# Patient Record
Sex: Male | Born: 1965 | Race: Black or African American | Hispanic: No | Smoking: Never smoker
Health system: Southern US, Community
[De-identification: ages and names within clinical notes are randomized; demographics above are authoritative.]

## PROBLEM LIST (undated history)

## (undated) DIAGNOSIS — R7303 Prediabetes: Secondary | ICD-10-CM

## (undated) DIAGNOSIS — H9191 Unspecified hearing loss, right ear: Secondary | ICD-10-CM

## (undated) DIAGNOSIS — K219 Gastro-esophageal reflux disease without esophagitis: Secondary | ICD-10-CM

## (undated) DIAGNOSIS — E785 Hyperlipidemia, unspecified: Secondary | ICD-10-CM

## (undated) DIAGNOSIS — H9319 Tinnitus, unspecified ear: Secondary | ICD-10-CM

## (undated) DIAGNOSIS — M48061 Spinal stenosis, lumbar region without neurogenic claudication: Secondary | ICD-10-CM

## (undated) DIAGNOSIS — G4730 Sleep apnea, unspecified: Secondary | ICD-10-CM

## (undated) DIAGNOSIS — Q159 Congenital malformation of eye, unspecified: Secondary | ICD-10-CM

## (undated) DIAGNOSIS — F4310 Post-traumatic stress disorder, unspecified: Secondary | ICD-10-CM

## (undated) DIAGNOSIS — R42 Dizziness and giddiness: Secondary | ICD-10-CM

## (undated) DIAGNOSIS — E291 Testicular hypofunction: Secondary | ICD-10-CM

## (undated) DIAGNOSIS — I10 Essential (primary) hypertension: Secondary | ICD-10-CM

## (undated) HISTORY — DX: Dizziness and giddiness: R42

## (undated) HISTORY — DX: Testicular hypofunction: E29.1

## (undated) HISTORY — PX: BUNIONECTOMY: SHX129

## (undated) HISTORY — DX: Spinal stenosis, lumbar region without neurogenic claudication: M48.061

## (undated) HISTORY — PX: EYE SURGERY: SHX253

## (undated) HISTORY — DX: Essential (primary) hypertension: I10

## (undated) HISTORY — DX: Hyperlipidemia, unspecified: E78.5

## (undated) HISTORY — PX: CATARACT EXTRACTION: SUR2

## (undated) HISTORY — DX: Prediabetes: R73.03

---

## 1997-12-25 ENCOUNTER — Ambulatory Visit (HOSPITAL_COMMUNITY): Admission: RE | Admit: 1997-12-25 | Discharge: 1997-12-25 | Payer: Self-pay

## 1999-10-21 ENCOUNTER — Emergency Department (HOSPITAL_COMMUNITY): Admission: EM | Admit: 1999-10-21 | Discharge: 1999-10-21 | Payer: Self-pay | Admitting: Emergency Medicine

## 1999-10-22 ENCOUNTER — Encounter: Payer: Self-pay | Admitting: Ophthalmology

## 1999-10-22 ENCOUNTER — Ambulatory Visit (HOSPITAL_COMMUNITY): Admission: RE | Admit: 1999-10-22 | Discharge: 1999-10-22 | Payer: Self-pay | Admitting: Ophthalmology

## 2000-01-17 ENCOUNTER — Emergency Department (HOSPITAL_COMMUNITY): Admission: EM | Admit: 2000-01-17 | Discharge: 2000-01-17 | Payer: Self-pay | Admitting: Emergency Medicine

## 2002-09-06 ENCOUNTER — Encounter: Admission: RE | Admit: 2002-09-06 | Discharge: 2002-09-19 | Payer: Self-pay | Admitting: Sports Medicine

## 2005-05-24 HISTORY — PX: TOTAL HIP ARTHROPLASTY: SHX124

## 2006-05-24 HISTORY — PX: PARTIAL NEPHRECTOMY: SHX414

## 2006-10-15 ENCOUNTER — Ambulatory Visit (HOSPITAL_COMMUNITY): Admission: RE | Admit: 2006-10-15 | Discharge: 2006-10-15 | Payer: Self-pay | Admitting: Urology

## 2006-10-24 ENCOUNTER — Encounter (INDEPENDENT_AMBULATORY_CARE_PROVIDER_SITE_OTHER): Payer: Self-pay | Admitting: Urology

## 2006-10-24 ENCOUNTER — Inpatient Hospital Stay (HOSPITAL_COMMUNITY): Admission: RE | Admit: 2006-10-24 | Discharge: 2006-10-27 | Payer: Self-pay | Admitting: Urology

## 2007-07-21 ENCOUNTER — Inpatient Hospital Stay (HOSPITAL_COMMUNITY): Admission: RE | Admit: 2007-07-21 | Discharge: 2007-07-25 | Payer: Self-pay | Admitting: Orthopedic Surgery

## 2010-06-14 ENCOUNTER — Encounter: Payer: Self-pay | Admitting: Urology

## 2010-10-06 NOTE — Op Note (Signed)
NAMEAREND, BAHL NO.:  [id number]   MEDICAL RECORD NO.:  [id number]          PATIENT TYPE:  INP   LOCATION:  NA                           FACILITY:  [facility]   PHYSICIAN:  Ollen Gross, M.D.    DATE OF BIRTH:  1966/04/02   DATE OF PROCEDURE:  07/21/2007  DATE OF DISCHARGE:                               OPERATIVE REPORT   PREOPERATIVE DIAGNOSES:  Avascular necrosis, right hip.   POSTOPERATIVE DIAGNOSES:  Avascular necrosis, right hip.   PROCEDURE:  Right total hip arthroplasty.   SURGEON:  Dr. Homero Fellers Aluisio.   ASSISTANT:  Avel Peace, PA-C.   ANESTHESIA:  General.   ESTIMATED BLOOD LOSS:  250.   DRAINS:  Hemovac x1.   COMPLICATIONS:  None.   CONDITION:  Stable to recovery.   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  He has gone on to femoral head collapse and presents now  for total hip arthroplasty.   PROCEDURE IN DETAIL:  After successful initiation of general anesthetic,  the patient is placed in the left lateral decubitus position with the  right side up and held with the hip positioner.  The right lower  extremity is isolated from his perineum with plastic drapes and prepped  and draped in the usual sterile fashion.  A short posterolateral  incision was made with a 10 blade through the subcutaneous tissue to the  level of fascia lata which was incised in line with the skin incision.  The sciatic nerve is palpated and protected and the short rotators  isolated off the femur.  Capsulectomy is performed and the hip is  dislocated.  It is noted that there is an area of collapse of the  femoral head.  The center of the head is marked and a trial prosthesis  placed such that the center of the trial head corresponds to the center  of his native femoral head.  The osteotomy line is marked on the femoral  neck and osteotomy made with an  oscillating saw.  The femoral head is  removed and then the  femur retracted anteriorly to gain acetabular  exposure.  On the back table,  I did bisect the femoral head with an  oscillating saw just to gauge the amount of osteonecrosis present.  He  did have about a 2 x 3 cm area of completely dead bone with a reactive  edema around that.   Acetabular retractors are placed and labrum and is removed.  Acetabular  reaming starts at 45 mm coursing in increments of 2-55 mm and then a 56  mm pinnacle acetabular shell was placed in anatomic position and  transfixed with two dome screws.  The apex hole eliminator is placed and  then the 40 mm neutral Ultamet metal liner is placed.  It is a metal-on-  metal hip replacement.   The tibia is prepared with the canal finder and irrigation.  Axial  reaming is performed up to 13.5 mm of proximal reaming to an 62F  and the  sleeve machined to extra extra large. An 49F extra extra large trial  sleeve is placed with an 18 x 13 stem and a 36 plus 12 neck matching  native anteversion.  A 40 plus.0 head is placed and the hip is reduced.  There is great stability, but soft tissue wise we needed a little more  tension.  We went to a 40 plus 3 which had the appropriate soft tissue  tension.  The right leg was placed on top of the left and the leg  lengths were equal.  Stability was excellent in full extension, flexion  and rotation. 70 degrees flexion, 40 degrees adduction, 90 degrees  internal rotation, 90 degrees of flexion, and 70 degrees of internal  rotation.  The hip is dislocated and the femoral trial is removed.  The  permanent 49F extra extra large sleeve is placed with an 18 x 13 stem  and a 36 plus 8 neck matching native anteversion.  The 40 plus 3 head is  placed and the hips reduced with the same stability parameters.  The  wound was copiously irrigated with saline solution and the short  rotators reattached to the femur through drill holes.  The  fascia lata  is closed over a Hemovac drain with interrupted #1 Vicryl, subcu closed  with #1 and 2-0 Vicryl and subcuticular with running 4-0 Monocryl.  The  drain is hooked to suction, incision cleaned and dried.  Steri-Strips  and a bulky sterile dressing applied.  His right lower extremity is then  placed into a knee immobilizer and he is awakened and transported to  recovery in stable condition.      Ollen Gross, M.D.  Electronically Signed     FA/MEDQ  D:  07/21/2007  T:  07/22/2007  Job:  [id number]

## 2010-10-06 NOTE — Discharge Summary (Signed)
NAMEHAI, GRABE NO.:  [id number]   MEDICAL RECORD NO.:  [id number]          PATIENT TYPE:  INP   LOCATION:  1606                         FACILITY:  [facility]   PHYSICIAN:  Ollen Gross, M.D.    DATE OF BIRTH:  [birth year]   DATE OF ADMISSION:  07/21/2007  DATE OF DISCHARGE:  07/25/2007                               DISCHARGE SUMMARY   ADMISSION DIAGNOSES:  1. Osteonecrosis/avascular necrosis, right hip.  2. History of renal cancer, status post partial nephrectomy.   DISCHARGE DIAGNOSES:  1. Avascular necrosis, right hip, status post right total replacement      arthroplasty.  2. Mild postop hyponatremia, improved.  3. History of renal cancer, status post partial nephrectomy.   PROCEDURE:  On July 21, 2007, right total hip surgery by Dr.  Lequita Halt.   Assistant Alexzandrew L. Perkins, PA-C.  Surgery performed under general  anesthesia.   CONSULTS:  None.   BRIEF HISTORY:  Craig [name] is a 45 year old male with high grade  osteonecrosis of the right hip with progressively worsening pain and  dysfunction.  He has gone on to have some thermal height collapse and  now presents for total hip arthroplasty.   LABORATORY DATA:  CBC on admission showed hemoglobin of 14.9, hematocrit  of 44.7, white cell count 6.7, platelets 270.  Chem panel on admission  all within normal limits.  Preop UA trace hemoglobin, otherwise  negative.  PT and INR preop 13.1 and 1.0 with PTT of 32.  Serial CBCs  were followed.  Last H and H was 12.6 and 36.9.  Serum pro times  followed, PT 126.2 and 2.3.  Serum BMPs were followed.  Sodium did drop  down to 134, postop back up to 136.   X-rays, hip films July 13, 2007, findings compatible, provide  history of AVM with patchy sclerosis and mild femoral head flattening.  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  Chest x-ray  dated Oct 21, 2006, no  acute cardiac or pulmonary process.  No evidence  of metastatic disease.   HOSPITAL COURSE:  The patient was admitted to [facility].  Tolerated the procedure well and was transferred to the recovery room  and then to the floor.  Started on PCA and pain medications obtained.  Following surgery started on Coumadin for DVT prophylaxis.  Actually  doing very well on the morning of day 1.  Already can tell pain he was  having preop was improved.  Started to get up out of bed by day 2.  He  was doing a little bit better, ambulating about 60 feet.  Just a little  bit of mild itching, felt to be due from the PCA that was discontinued.  IVs were Hep-Locked.  Started progressing well with his PT and by day 3,  he was up ambulating much better.  Weaned over to p.o. meds.  Continue  with therapy and try to go home on July 25, 2007.   DISCHARGE PLAN:  1. The patient  is discharged home on July 25, 2007.  2. Discharge diagnosis, please see above.  3. Discharge medications:  Coumadin, Percocet, Robaxin.  4. Followup in 2 weeks.  5. Activity, partial weightbearing 25-50% right lower extremity.  6. Diet as tolerated.   DISPOSITION:  Home.   CONDITION ON DISCHARGE:  Improved.      Alexzandrew L. Perkins, P.A.C.      Ollen Gross, M.D.  Electronically Signed    ALP/MEDQ  D:  07/25/2007  T:  07/25/2007  Job:  [id number]   cc:   Ollen Gross, M.D.  Fax: [phone]   [facility]

## 2010-10-06 NOTE — H&P (Signed)
Craig [name], BRENNEN NO.:  [id number]   MEDICAL RECORD NO.:  [id number]          PATIENT TYPE:  INP   LOCATION:  1606                         FACILITY:  [facility]   PHYSICIAN:  Ollen Gross, M.D.    DATE OF BIRTH:  1965-10-26   DATE OF ADMISSION:  07/21/2007  DATE OF DISCHARGE:                              HISTORY & PHYSICAL   DATE OF OFFICE VISIT/HISTORY AND PHYSICAL:  July 07, 2007.   DATE OF ADMISSION:  July 21, 2007.   CHIEF COMPLAINT:  Right hip pain.   HISTORY OF PRESENT ILLNESS:  The patient is a 45 year old male who has  seen by Dr. Lequita Halt in second opinion for ongoing right hip pain.  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  It  shortly began after removal of a cyst from his kidney.  He had a partial  nephrectomy done for a renal cancer.  That turned out well but shortly  after he started having intense pain which was progressive.  He had  workup by Dr. Prince Rome and initial films were negative but significant for  an MRI which showed stage II  osteonecrosis of the hip.  He was referred  to [facility] for vascularized fibular grafting, but due to the extended long  recovery period of time he wanted to look into other procedures and  options.  He was seen in second opinion by Dr. Homero Fellers Aluisio.  Risks and  benefits and surgical options discussed, and he has elected to proceed  with total hip replacement.   ALLERGIES:  NAPROSYN CAUSES A RASH.   INTOLERANCES:  HYDROCODONE CAUSES HIM TO FREAK OUT.   PAST MEDICAL HISTORY:  Renal cancer.   PAST SURGICAL HISTORY:  Partial left nephrectomy for renal cancer,  bunion x2, and also shoulder surgery.   FAMILY HISTORY:  Negative.   SOCIAL HISTORY:  Married.  Works for the [employer].  Nonsmoker.  No alcohol.  Two children.   REVIEW OF SYSTEMS:  GENERAL:  No fevers, chills, night sweats.  NEUROLOGIC:   No seizure, syncope, paralysis.  RESPIRATORY:  No shortness  breath, productive cough or hemoptysis.  CARDIOVASCULAR:  No chest pain,  angina, orthopnea.  GASTROINTESTINAL:  No nausea, vomiting, diarrhea,  constipation.  GENITOURINARY:  No dysuria,  hematuria or discharge.  MUSCULOSKELETAL:  Right hip.   PHYSICAL EXAMINATION:  VITAL SIGNS:  Pulse 68, respirations 12, blood  pressure 112/78.  GENERAL:  A 45 year old [place] American male, well-nourished, well-  developed, large muscular frame.  Alert, oriented and cooperative,  accompanied by his wife.  HEENT:  Normocephalic, atraumatic.  Pupils are round and reactive.  EOMs  intact.  NECK:  Supple.  CHEST:  Clear.  HEART:  Regular rate and rhythm without murmur.  ABDOMEN:  Soft, nontender.  Bowel sounds present.  BREAST/GENITALIA:  Not done, not pertinent to present illness.  EXTREMITIES:  Right hip flexion 100, internal rotation 10, external  rotation 30, abduction 30.   IMPRESSION:  Osteonecrosis/avascular necrosis, right hip.  PLAN:  The patient admitted to [facility] to undergo a right  total hip replacement arthroplasty.  Surgery will be performed by Dr.  Ollen Gross.      Alexzandrew L. Perkins, P.A.C.      Ollen Gross, M.D.  Electronically Signed    ALP/MEDQ  D:  07/20/2007  T:  07/21/2007  Job:  [id number]   cc:   Ollen Gross, M.D.  Fax: [phone]   [facility]

## 2010-10-06 NOTE — H&P (Signed)
Craig [name], Craig [name] NO.:  [id number]   MEDICAL RECORD NO.:  [id number]          PATIENT TYPE:  INP   LOCATION:  1402                         FACILITY:  [facility]   PHYSICIAN:  Sigmund I. 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   The patient had complained of back pain and underwent MRI at [facility] on 09/28/2006 with a finding of a degenerative disk disease,  with an incidental finding of a 2.5 cm left lower pole renal mass.  The  patient had ultrasound that made the mass appear solid.  CT scan with  and without contrast was accomplished, again showing that the patient  appeared to have a solid mass with 39 Hounsfield units by measurement.  Repeat MRI of the kidney showed the lesion well, but was unable to  determine if it was a renal cell carcinoma or not.  The patient is now  for partial nephrectomy.   PAST UROLOGIC HISTORY:  Noncontributory.   PAST MEDICAL HISTORY:  Noncontributory except for chronic low back pain.   PAST SURGERIES:  Bilateral leg surgery.   PAST MEDICATIONS:  P.r.n. Darvocet only.   ALLERGIES:  None known.   FAMILY HISTORY:  Noncontributory.  Both parents alive and well.   Tobacco none.  Alcohol none.   Constitutional review of systems is otherwise noncontributory.   PHYSICAL EXAMINATION:  GENERAL:  Physical exam shows a well-developed,  well-nourished African American male in no acute distress.  VITAL SIGNS:  His blood pressure was 134/94, pulse 82, temperature 97.6.  NECK: Supple, nontender.  CHEST:  Clear to P&A.  ABDOMEN:  Soft.  Positive bowel sounds.  No organomegaly or masses.  The  patient is a well muscled individual.  GENITOURINARY:  Shows normal penis, normal urethra, normal  glans.  Testicles measure 4 x 4 cm and nontender.  RECTAL: Examination not indicated this examination.  EXTREMITIES:  No cyanosis or edema.  PSYCHOLOGIC:  Normal orientation to time, person, place.  SKIN:  Normal to inspection, palpation.   IMPRESSION:  Indeterminate left renal mass for exploration and  extirpation.      Sigmund I. Patsi Sears, M.D.  Electronically Signed     SIT/MEDQ  D:  10/24/2006  T:  10/25/2006  Job:  [id number]

## 2010-10-06 NOTE — Op Note (Signed)
Craig [name], Craig [name] NO.:  [id number]   MEDICAL RECORD NO.:  [id number]          PATIENT TYPE:  INP   LOCATION:  1402                         FACILITY:  [facility]   PHYSICIAN:  Sigmund I. Patsi Sears, M.D.DATE OF BIRTH:  1966-04-13   DATE OF PROCEDURE:  10/24/2006  DATE OF DISCHARGE:                               OPERATIVE REPORT   PREOPERATIVE DIAGNOSIS:  Left lower pole indeterminate cystic versus  solid renal mass.   POSTOPERATIVE DIAGNOSIS:  Left lower pole solid renal mass.   OPERATION:  Left partial nephrectomy.   SURGEON:  Sigmund I. Patsi Sears, M.D.   ASSISTANT:  Ezzie Dural, M.D.   PREPARATION:  After appropriate preanesthesia, the patient was brought  to operating room and placed on the operating table in the dorsal supine  position, where general endotracheal anesthesia was induced.  He was  placed in the right lateral decubitus position, where the left flank was  prepped with Betadine and draped in usual fashion.   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  Ultrasound  and CT classified the lesion as a Bosniak 59F lesion, with a CT showing  possible solid tumor, with approximate 25 HU.  Because diagnosis could  not be made, the patient is for exploration and partial left  nephrectomy.   PROCEDURE:  A 15-cm left flank incision was made over the 12th rib and  subcutaneous tissue dissected with the electrosurgical unit.  The tip  the 12th rib was identified, and the 12th rib dissected and amputated  proximal-ward.  Care was taken to avoid any injury to the pleura, and  dissection was then accomplished of the external oblique, internal  oblique and transverse abdominis muscle, with care taken to avoid any  injury into the peritoneal cavity.  The rectus muscle was not  identified.   A  self-retaining retractor was placed, and the retroperitoneum was  entered.  The lower pole of the kidney was identified and Gerota's  fascia entered, and the perirenal fat was dissected.  Cyst was  identified in the lateral aspect of the lower pole, and this coincided  with a cyst identified on MRI.  Beneath this area was a 2-cm area which  appeared to be the mass in question.  The mass, however, was endophytic,  and was poorly palpable or identifiable from the outside of the kidney.   The area around the tumor was scarified with the electrosurgical unit,  and the kidney mass dissected.  The mass was removed, measuring  approximately 2.5 cm.   The larger bleeders were electrocoagulated with both the cautery and the  argon beam coagulator.  FloSeal was placed in the wound, and all  bleeding was stopped.  Tisseel was also placed, and the area of  dissection covered with fat and sutured with 2-0 PDS suture.   Frozen section revealed that the mass was positive for solid tumor,  although it could not be decided what  the exact nature of the tumor was;  it did not appear to be a typical renal cell carcinoma, however.  It was  felt that the patient may have a benign tumor.  The margins were taken  as deep as possible, to the level of the infundibulum of the lower pole;  it felt that deeper margin could not be obtained without going into the  renal pelvis; therefore, the wound was closed.   The transverse abdominis muscle was closed with running #1 PDS suture.  The internal oblique and external oblique were closed separately with  running #1 PDS sutures.  The rib bed was closed with #1 PDS suture.  Marcaine 0.5, thirty milliliters, was injected into the rib bed and the  wound edges.   Marcaine pump was inserted beneath the external oblique fascia, and  superficial to the external oblique fascia in the subcutaneous tissue.  This was held in place with tape.   The skin was then closed with  skin staples.  A sterile dressing was  applied, and the patient was awakened and taken to the recovery room in  good condition.  It is noted that the patient had a Foley catheter  placed at the beginning of the case.      Sigmund I. Patsi Sears, M.D.  Electronically Signed     SIT/MEDQ  D:  10/24/2006  T:  10/25/2006  Job:  [id number]

## 2010-10-06 NOTE — Discharge Summary (Signed)
NAMEMAALIK, PINN NO.:  [id number]   MEDICAL RECORD NO.:  [id number]           PATIENT TYPE:   LOCATION:                                 FACILITY:   PHYSICIAN:  Sigmund I. Patsi Sears, M.D.DATE OF BIRTH:  01/05/1966   DATE OF ADMISSION:  DATE OF DISCHARGE:                               DISCHARGE SUMMARY   __________   FINAL DIAGNOSES:  Renal mass, indeterminate, pathology pending.   OPERATIONS THIS ADMISSION:  June 2, partial left lower pole nephrectomy.   ADDITIONAL DIAGNOSES THIS ADMISSION:  Ocular global protrusion and  ocular global prolapse (resolved).   DISCHARGE MEDICATIONS:  Percocet 5/325, 1 q.4 h. p.r.n. pain.   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  The renal mass was the initial finding.  The ultrasound appeared to be  solid and CT with contrast was accomplished and showed Houndsfield units  of 39.  MRI of the lesion continued to be indeterminate for renal cell  carcinoma and the patient was admitted to the operating room for partial  nephrectomy, with extirpation tumor.   PAST UROLOGIC HISTORY:  Noncontributory.   PAST MEDICAL HISTORY:  Chronic low back pain, remitting.   REVIEW OF SYSTEMS:  Noncontributory.   MEDICATIONS:  None.   ALLERGIES:  None.   FAMILY HISTORY:  Noncontributory.   TOBACCO:  None.   ALCOHOL:  None.   ADMISSION PHYSICAL EXAMINATION:  Unchanged since the exam on October 24, 2006, on admission.   ADMISSION LABORATORY DATA:  1. Sodium 134, potassium 3.5, chloride 98, CO2 28, BUN 15, creatinine      1.5, calcium 9.1.  Hemoglobin 14.8, hematocrit 44.7.  2. A chest x-ray shows normal lungs.   HOSPITAL COURSE:  On the day of admission, the patient underwent left  lower pole nephrectomy.  Pathologic examination is pending review with  the  [place] [facility] [place].  The patient has done well with minimal  postoperative pain secondary to pain pump.  The pain pump was removed.  The patient was able to eat and had a bowel movement.  His Foley was  removed on the 1st postoperative day and the patient voided well.  He  was allowed to be discharged in good condition.  He will be notified of  his pathology results.  He will return in one week for office suture  removal.      Sigmund I. Patsi Sears, M.D.  Electronically Signed     SIT/MEDQ  D:  10/27/2006  T:  10/27/2006  Job:  [id number]

## 2011-02-12 LAB — TYPE AND SCREEN: ABO/RH(D): B POS

## 2011-02-12 LAB — COMPREHENSIVE METABOLIC PANEL
ALT: 39
AST: 33
Alkaline Phosphatase: 53
CO2: 29
Chloride: 106
GFR calc Af Amer: >60
GFR calc non Af Amer: >60
Sodium: 142
Total Bilirubin: 0.8

## 2011-02-12 LAB — PROTIME-INR: Prothrombin Time: 14.1

## 2011-02-12 LAB — URINE MICROSCOPIC-ADD ON

## 2011-02-12 LAB — URINALYSIS, ROUTINE W REFLEX MICROSCOPIC
Glucose, UA: 100 — AB
Leukocytes, UA: NEGATIVE
Protein, ur: NEGATIVE
Specific Gravity, Urine: 1.022
Urobilinogen, UA: 0.2

## 2011-02-12 LAB — CBC
HCT: 41.1
MCV: 83.4
MCV: 84.2
Platelets: 240
RBC: 5.31
RDW: 14.8
WBC: 6.7

## 2011-02-12 LAB — BASIC METABOLIC PANEL
BUN: 4 — ABNORMAL LOW
Chloride: 98
Creatinine, Ser: 1.19
Glucose, Bld: 118 — ABNORMAL HIGH
Potassium: 3.6

## 2011-02-15 LAB — BASIC METABOLIC PANEL
BUN: 4 — ABNORMAL LOW
CO2: 29
Calcium: 8.5
Creatinine, Ser: 1.14
Glucose, Bld: 106 — ABNORMAL HIGH

## 2011-02-15 LAB — PROTIME-INR
INR: 1.4
INR: 2.3 — ABNORMAL HIGH
Prothrombin Time: 17.5 — ABNORMAL HIGH
Prothrombin Time: 26.2 — ABNORMAL HIGH
Prothrombin Time: 28.5 — ABNORMAL HIGH

## 2011-02-15 LAB — CBC
HCT: 36.9 — ABNORMAL LOW
MCHC: 34.1
Platelets: 228
Platelets: 234
RDW: 14.5
RDW: 14.6

## 2012-05-24 HISTORY — PX: CATARACT EXTRACTION W/ INTRAOCULAR LENS IMPLANT: SHX1309

## 2012-06-14 ENCOUNTER — Other Ambulatory Visit: Payer: Self-pay | Admitting: Otolaryngology

## 2012-06-14 DIAGNOSIS — H8109 Meniere's disease, unspecified ear: Secondary | ICD-10-CM

## 2012-06-14 DIAGNOSIS — H905 Unspecified sensorineural hearing loss: Secondary | ICD-10-CM

## 2012-06-14 DIAGNOSIS — H903 Sensorineural hearing loss, bilateral: Secondary | ICD-10-CM

## 2012-06-14 DIAGNOSIS — H81399 Other peripheral vertigo, unspecified ear: Secondary | ICD-10-CM

## 2012-08-06 ENCOUNTER — Ambulatory Visit
Admission: RE | Admit: 2012-08-06 | Discharge: 2012-08-06 | Disposition: A | Discharge status: Home / Self Care | Payer: 59 | Source: Ambulatory Visit | Attending: Otolaryngology | Admitting: Otolaryngology

## 2012-08-06 DIAGNOSIS — H903 Sensorineural hearing loss, bilateral: Secondary | ICD-10-CM

## 2012-08-06 DIAGNOSIS — H8109 Meniere's disease, unspecified ear: Secondary | ICD-10-CM

## 2012-08-06 DIAGNOSIS — H905 Unspecified sensorineural hearing loss: Secondary | ICD-10-CM

## 2012-08-06 DIAGNOSIS — H81399 Other peripheral vertigo, unspecified ear: Secondary | ICD-10-CM

## 2012-08-06 MED ORDER — GADOBENATE DIMEGLUMINE 529 MG/ML IV SOLN
20.0000 mL | Freq: Once | INTRAVENOUS | Status: AC | PRN
  Administered 2012-08-06: 20 mL via INTRAVENOUS

## 2013-03-05 ENCOUNTER — Ambulatory Visit (HOSPITAL_BASED_OUTPATIENT_CLINIC_OR_DEPARTMENT_OTHER): Payer: 59 | Attending: Urology | Admitting: Radiology

## 2013-03-05 VITALS — Ht 73.0 in | Wt 215.0 lb

## 2013-03-05 DIAGNOSIS — R0609 Other forms of dyspnea: Secondary | ICD-10-CM | POA: Insufficient documentation

## 2013-03-05 DIAGNOSIS — R0989 Other specified symptoms and signs involving the circulatory and respiratory systems: Secondary | ICD-10-CM | POA: Insufficient documentation

## 2013-03-05 DIAGNOSIS — G4733 Obstructive sleep apnea (adult) (pediatric): Secondary | ICD-10-CM

## 2013-03-10 DIAGNOSIS — G4733 Obstructive sleep apnea (adult) (pediatric): Secondary | ICD-10-CM

## 2013-03-10 NOTE — Procedures (Signed)
NAMECHAROD, SLAWINSKI NO.:  [id number]  MEDICAL RECORD NO.:  [id number]          PATIENT TYPE:  OUT  LOCATION:  SLEEP CENTER                 FACILITY:  [facility]  PHYSICIAN:  Latissa Frick D. Maple Hudson, MD, FCCP, FACPDATE OF BIRTH:  22-Apr-1968  DATE OF STUDY:  03/05/2013                           NOCTURNAL POLYSOMNOGRAM  REFERRING PHYSICIAN:  Sigmund I. Patsi Sears, M.D.  INDICATION FOR STUDY:  Hypersomnia with sleep apnea.  EPWORTH SLEEPINESS SCORE:  13/24.  BMI 28.4, weight 215 pounds, height 73 inches, neck 18.5 inches.  MEDICATIONS:  Home medications are charted for review.  SLEEP ARCHITECTURE:  Total sleep time 379 minutes with sleep efficiency 85.7%.  Stage I was 4.4%, stage II 73.9%.  Stage III absent, REM 21.8% of total sleep time.  Sleep latency 33 minutes.  REM latency 108 minutes.  Awake after sleep onset 30 minutes.  Arousal index 20.1.  BEDTIME MEDICATION:  None.  RESPIRATORY DATA:  Apnea-hypopnea index (AHI) 19.5 per hour.  A total of 123 events was scored including 22 obstructive apneas, 18 central apneas, 3 mixed apneas, 80 hypopneas.  Events were not positional.  REM AHI 30.5 per hour.  This study was ordered as a diagnostic NPSG protocol without CPAP.  CPAP titration was not done.  OXYGEN DATA:  Moderate snoring with oxygen desaturation to a nadir of 88% and mean oxygen saturation through the study of 94.5% on room air.  CARDIAC DATA:  Normal sinus rhythm.  MOVEMENT-PARASOMNIA:  A total of 115 limb jerks were counted of which 13 were associated with arousals or awakening for periodic limb movements with arousal index of 2.1 per hour.  No bathroom trips.  IMPRESSIONS-RECOMMENDATIONS: 1. Moderate obstructive sleep apnea/hypopnea syndrome, AHI 19.5 per     hour.  Nonpositional.  REM AHI 30.5 per hour.  Moderate snoring     with oxygen desaturation to a nadir of 88% and mean oxygen     saturation through the study of 94.5% on room air. 2. This study  was ordered as a diagnostic NPSG protocol without CPAP.     The patient can return for a dedicated CPAP titration study if     appropriate, consultation for therapeutic options is available if     desired. 3. Periodic limb movement with arousal syndrome.  A total of 115 limb     jerks were counted of which 13 were associated with arousals or     awakening from sleep.  PLMA index 2.1 per hour.  Some of these     events may     reflect respiratory sleep disturbance.  This could be reassessed     clinically after treatment for sleep apnea, as appropriate.     Izeyah Deike D. Maple Hudson, MD, [facility], FACP Diplomate, American Board of Sleep Medicine    CDY/MEDQ  D:  03/10/2013 14:23:02  T:  03/10/2013 21:49:10  Job:  [id number]

## 2013-04-26 ENCOUNTER — Institutional Professional Consult (permissible substitution): Payer: 59 | Admitting: Pulmonary Disease

## 2013-04-27 ENCOUNTER — Encounter: Payer: Self-pay | Admitting: Pulmonary Disease

## 2013-05-28 ENCOUNTER — Encounter: Payer: Self-pay | Admitting: Internal Medicine

## 2013-05-31 ENCOUNTER — Ambulatory Visit (INDEPENDENT_AMBULATORY_CARE_PROVIDER_SITE_OTHER): Payer: 59 | Admitting: Internal Medicine

## 2013-05-31 ENCOUNTER — Encounter: Payer: Self-pay | Admitting: Internal Medicine

## 2013-05-31 VITALS — BP 128/94 | HR 76 | Temp 98.1°F | Resp 18 | Wt 215.0 lb

## 2013-05-31 DIAGNOSIS — I10 Essential (primary) hypertension: Secondary | ICD-10-CM

## 2013-05-31 DIAGNOSIS — Z79899 Other long term (current) drug therapy: Secondary | ICD-10-CM

## 2013-05-31 DIAGNOSIS — R7309 Other abnormal glucose: Secondary | ICD-10-CM

## 2013-05-31 DIAGNOSIS — E782 Mixed hyperlipidemia: Secondary | ICD-10-CM

## 2013-05-31 DIAGNOSIS — E559 Vitamin D deficiency, unspecified: Secondary | ICD-10-CM

## 2013-05-31 DIAGNOSIS — R7303 Prediabetes: Secondary | ICD-10-CM | POA: Insufficient documentation

## 2013-05-31 LAB — BASIC METABOLIC PANEL WITH GFR
BUN: 10 mg/dL (ref 6–23)
CHLORIDE: 103 meq/L (ref 96–112)
CO2: 29 meq/L (ref 19–32)
CREATININE: 1.17 mg/dL (ref 0.50–1.35)
Calcium: 9.4 mg/dL (ref 8.4–10.5)
GFR, EST NON AFRICAN AMERICAN: 75 mL/min
GFR, Est African American: 87 mL/min
Glucose, Bld: 90 mg/dL (ref 70–99)
POTASSIUM: 4.3 meq/L (ref 3.5–5.3)
Sodium: 139 mEq/L (ref 135–145)

## 2013-05-31 LAB — HEPATIC FUNCTION PANEL
ALK PHOS: 49 U/L (ref 39–117)
ALT: 37 U/L (ref 0–53)
AST: 26 U/L (ref 0–37)
Albumin: 4.4 g/dL (ref 3.5–5.2)
BILIRUBIN DIRECT: 0.1 mg/dL (ref 0.0–0.3)
BILIRUBIN INDIRECT: 0.5 mg/dL (ref 0.0–0.9)
BILIRUBIN TOTAL: 0.6 mg/dL (ref 0.3–1.2)
Total Protein: 7.2 g/dL (ref 6.0–8.3)

## 2013-05-31 LAB — LIPID PANEL
CHOL/HDL RATIO: 3.9 ratio
Cholesterol: 164 mg/dL (ref 0–200)
HDL: 42 mg/dL (ref >39)
LDL CALC: 107 mg/dL — AB (ref 0–99)
TRIGLYCERIDES: 73 mg/dL (ref <150)
VLDL: 15 mg/dL (ref 0–40)

## 2013-05-31 LAB — CBC WITH DIFFERENTIAL/PLATELET
BASOS ABS: 0 10*3/uL (ref 0.0–0.1)
Basophils Relative: 0 % (ref 0–1)
EOS PCT: 2 % (ref 0–5)
Eosinophils Absolute: 0.1 10*3/uL (ref 0.0–0.7)
HEMATOCRIT: 44.7 % (ref 39.0–52.0)
Hemoglobin: 15.1 g/dL (ref 13.0–17.0)
LYMPHS ABS: 1.8 10*3/uL (ref 0.7–4.0)
LYMPHS PCT: 30 % (ref 12–46)
MCH: 27.4 pg (ref 26.0–34.0)
MCHC: 33.8 g/dL (ref 30.0–36.0)
MCV: 81.1 fL (ref 78.0–100.0)
Monocytes Absolute: 0.6 10*3/uL (ref 0.1–1.0)
Monocytes Relative: 10 % (ref 3–12)
NEUTROS ABS: 3.3 10*3/uL (ref 1.7–7.7)
Neutrophils Relative %: 58 % (ref 43–77)
PLATELETS: 255 10*3/uL (ref 150–400)
RBC: 5.51 MIL/uL (ref 4.22–5.81)
RDW: 15.5 % (ref 11.5–15.5)
WBC: 5.8 10*3/uL (ref 4.0–10.5)

## 2013-05-31 LAB — MAGNESIUM: Magnesium: 2 mg/dL (ref 1.5–2.5)

## 2013-05-31 NOTE — Progress Notes (Signed)
Patient ID: Craig [name], male   DOB: 27-Jul-1965, 48 y.o.   MRN: [id number]   This very nice 48 y.o. MBM presents for 3 month follow up with Hypertension, Hyperlipidemia, Pre-Diabetes and Vitamin D Deficiency.    HTN predates since 11/2011 and patient had been treated, but has stopped his meds because of postural dizziness BP has been controlled at home. Today's BP 128/94. Patient denies any cardiac type chest pain, palpitations, dyspnea/orthopnea/PND, dizziness, claudication, or dependent edema.   Hyperlipidemia is controlled with diet & meds. Last Cholesterol was  162, Triglycerides were  133, HDL 37 and LDL  98 - all at goal in Oct 2014. Patient denies myalgias or other med SE's.    Also, the patient has history of PreDiabetes with A1c 5.9% in July 2013 and with last A1c of  6.2% in Oct 2014. Patient denies any symptoms of reactive hypoglycemia, diabetic polys, paresthesias or visual blurring.   Further, Patient has history of Vitamin D Deficiency of 16 in July 2013 105 in Oct 2014 with dose decreased. Patient supplements vitamin D without any suspected side-effects.  Medication Sig Dispense Refill  . Cholecalciferol (VITAMIN D-3) 5000 UNITS TABS Take 10,000 Units by mouth daily.      . Cinnamon 500 MG capsule Take 500 mg by mouth daily.      . cyanocobalamin 500 MCG tablet Take 500 mcg by mouth daily.      . Ginger, Zingiber officinalis, (GINGER PO) Take by mouth.         Allergies  Allergen Reactions  . Ziac [Bisoprolol-Hydrochlorothiazide]     Erectile dysfunction    PMHx:   Past Medical History  Diagnosis Date  . Hypertension   . Hyperlipidemia   . Hypogonadism male   . Pre-diabetes   . Vertigo     FHx:    Reviewed / unchanged  SHx:    Reviewed / unchanged  Systems Review: Constitutional: Denies fever, chills, wt changes, headaches, insomnia, fatigue, night sweats, change in appetite. Eyes: Denies redness, blurred vision, diplopia, discharge, itchy, watery eyes.  ENT:  Denies discharge, congestion, post nasal drip, epistaxis, sore throat, earache, hearing loss, dental pain, tinnitus, vertigo, sinus pain, snoring.  CV: Denies chest pain, palpitations, irregular heartbeat, syncope, dyspnea, diaphoresis, orthopnea, PND, claudication, edema. Respiratory: denies cough, dyspnea, DOE, pleurisy, hoarseness, laryngitis, wheezing.  Gastrointestinal: Denies dysphagia, odynophagia, heartburn, reflux, water brash, abdominal pain or cramps, nausea, vomiting, bloating, diarrhea, constipation, hematemesis, melena, hematochezia,  or hemorrhoids. Genitourinary: Denies dysuria, frequency, urgency, nocturia, hesitancy, discharge, hematuria, flank pain. Musculoskeletal: Denies arthralgias, myalgias, stiffness, jt. swelling, pain, limp, strain/sprain.  Skin: Denies pruritus, rash, hives, warts, acne, eczema, change in skin lesion(s). Neuro: No weakness, tremor, incoordination, spasms, paresthesia, or pain. Psychiatric: Denies confusion, memory loss, or sensory loss. Endo: Denies change in weight, skin, hair change.  Heme/Lymph: No excessive bleeding, bruising, orenlarged lymph nodes.  BP: 128/94  Pulse: 76  Temp: 98.1 F (36.7 C)  Resp: 18    Estimated body mass index is 28.37 kg/(m^2) as calculated from the following:   Height as of 03/05/13: 6\' 1"  (1.854 m).   Weight as of this encounter: 215 lb (97.523 kg).  On Exam: Appears well nourished - in no distress. Eyes: PERRLA, EOMs, conjunctiva no swelling or erythema. Sinuses: No frontal/maxillary tenderness ENT/Mouth: EAC's clear, TM's nl w/o erythema, bulging. Nares clear w/o erythema, swelling, exudates. Oropharynx clear without erythema or exudates. Oral hygiene is good. Tongue normal, non obstructing. Hearing intact.  Neck: Supple. Thyroid nl.  Car 2+/2+ without bruits, nodes or JVD. Chest: Respirations nl with BS clear & equal w/o rales, rhonchi, wheezing or stridor.  Cor: Heart sounds normal w/ regular rate and rhythm  without sig. murmurs, gallops, clicks, or rubs. Peripheral pulses normal and equal  without edema.  Abdomen: Soft & bowel sounds normal. Non-tender w/o guarding, rebound, hernias, masses, or organomegaly.  Lymphatics: Unremarkable.  Musculoskeletal: Full ROM all peripheral extremities, joint stability, 5/5 strength, and normal gait.  Skin: Warm, dry without exposed rashes, lesions, ecchymosis apparent.  Neuro: Cranial nerves intact, reflexes equal bilaterally. Sensory-motor testing grossly intact. Tendon reflexes grossly intact.  Pysch: Alert & oriented x 3. Insight and judgement nl & appropriate. No ideations.  Assessment and Plan:  1. Hypertension - Continue monitor blood pressure at home. Continue diet/meds same.  2. Hyperlipidemia - Continue diet/meds, exercise,& lifestyle modifications. Continue monitor periodic cholesterol/liver & renal functions   3. Pre-diabetes - Continue diet, exercise, lifestyle modifications. Monitor appropriate labs.  4. Vitamin D Deficiency - Continue supplementation.  Recommended regular exercise, BP monitoring, weight control, and discussed med and SE's. Recommended labs to assess and monitor clinical status. Further disposition pending results of labs.

## 2013-05-31 NOTE — Patient Instructions (Signed)

## 2013-06-01 LAB — HEMOGLOBIN A1C
Hgb A1c MFr Bld: 6.1 % — ABNORMAL HIGH (ref <5.7)
Mean Plasma Glucose: 128 mg/dL — ABNORMAL HIGH (ref <117)

## 2013-06-01 LAB — INSULIN, FASTING: INSULIN FASTING, SERUM: 35 u[IU]/mL — AB (ref 3–28)

## 2013-06-01 LAB — VITAMIN D 25 HYDROXY (VIT D DEFICIENCY, FRACTURES): Vit D, 25-Hydroxy: 82 ng/mL (ref 30–89)

## 2013-06-01 LAB — TSH: TSH: 1.678 u[IU]/mL (ref 0.350–4.500)

## 2013-06-05 ENCOUNTER — Telehealth: Payer: Self-pay | Admitting: *Deleted

## 2013-06-05 NOTE — Telephone Encounter (Signed)
Message copied by Emelda Brothers on Tue Jun 05, 2013 12:34 PM ------      Message from: Unk Pinto      Created: Sun Jun 03, 2013  5:09 PM       Cbc kidneys liver thyroid - all nl/ok      Chol 164 good but ldl 107 high (goal <70) - stricter diet and weight loss - avoid meat esp red meat and dairy esp cheese      Thyroid nl - vit D 82 excellent      A1c 6.1 % - still in early diabetic range - can cure if lose weight - avoid sweets/candy and all white stuff and recc cinnamon 1000 mg bid to lower glucose (can get at [email or web address]) ------

## 2013-06-12 ENCOUNTER — Telehealth: Payer: Self-pay | Admitting: *Deleted

## 2013-06-12 NOTE — Telephone Encounter (Signed)
Spoke with pt about lab results.

## 2013-06-21 ENCOUNTER — Institutional Professional Consult (permissible substitution): Payer: 59 | Admitting: Pulmonary Disease

## 2013-07-13 ENCOUNTER — Ambulatory Visit (INDEPENDENT_AMBULATORY_CARE_PROVIDER_SITE_OTHER): Payer: 59 | Admitting: Pulmonary Disease

## 2013-07-13 ENCOUNTER — Encounter: Payer: Self-pay | Admitting: Pulmonary Disease

## 2013-07-13 VITALS — BP 132/90 | HR 63 | Temp 98.1°F | Ht 73.0 in | Wt 215.0 lb

## 2013-07-13 DIAGNOSIS — G4733 Obstructive sleep apnea (adult) (pediatric): Secondary | ICD-10-CM

## 2013-07-13 NOTE — Progress Notes (Signed)
Subjective:    Patient ID: Craig [name], male    DOB: Feb 24, 1966, 48 y.o.   MRN: [id number]  HPI The patient is a 48 year old male who I've been asked to see for management of obstructive sleep apnea. He had a recent sleep study in October of last year which showed moderate OSA, with an AHI of 20 events per hour. He has been noted to have loud snoring during sleep as well as an abnormal breathing pattern by his wife. He feels that he is a restless sleeper, and does not feel refreshed in the mornings upon arising. He denies any alertness issues during the day, even with inactivity. He has no issues watching television or movies at night, and has no sleepiness with driving. The patient states that his weight is mildly increased over the last 2 years, and his Epworth score today is only 5.   Sleep Questionnaire What time do you typically go to bed?( Between what hours) 1am 1am at 0937 on 07/13/13 by Virl Cagey, CMA How long does it take you to fall asleep? 68mins- 1 hr 54mins- 1 hr at 0937 on 07/13/13 by Virl Cagey, CMA How many times during the night do you wake up? 2 2 at 0937 on 07/13/13 by Virl Cagey, CMA What time do you get out of bed to start your day? 0600 0600 at 0937 on 07/13/13 by Virl Cagey, CMA Do you drive or operate heavy machinery in your occupation? No No at 0937 on 07/13/13 by Virl Cagey, CMA How much has your weight changed (up or down) over the past two years? (In pounds) 0 oz (0 kg) 0 oz (0 kg) at 0937 on 07/13/13 by Virl Cagey, CMA Have you ever had a sleep study before? Yes Yes at 0937 on 07/13/13 by Virl Cagey, CMA If yes, location of study? [facility] at [id number] on 07/13/13 by Virl Cagey, CMA If yes, date of study? 02/2013 02/2013 at 0937 on 07/13/13 by Virl Cagey, CMA Do you currently use CPAP? No No at 0937 on 07/13/13 by Virl Cagey, CMA Do you wear oxygen at any time? No No at 0937 on 07/13/13 by Virl Cagey, CMA   Review of Systems  Constitutional: Negative for fever and unexpected weight change.  HENT: Positive for rhinorrhea and sore throat. Negative for congestion, dental problem, ear pain, nosebleeds, postnasal drip, sinus pressure, sneezing and trouble swallowing.   Eyes: Negative for redness and itching.  Respiratory: Negative for cough, chest tightness, shortness of breath and wheezing.   Cardiovascular: Negative for palpitations and leg swelling.  Gastrointestinal: Negative for nausea and vomiting.       Acid heartburn  Genitourinary: Negative for dysuria.  Musculoskeletal: Negative for joint swelling.  Skin: Negative for rash.  Neurological: Negative for headaches.  Hematological: Does not bruise/bleed easily.  Psychiatric/Behavioral: Negative for dysphoric mood. The patient is not nervous/anxious.        Objective:   Physical Exam Constitutional:  Overweight male, no acute distress  HENT:  Nares patent without discharge  Oropharynx without exudate, palate and uvula are thick and elongated.  +tissue redundancy posteriorly  Eyes:  Perrla, eomi, no scleral icterus  Neck:  No JVD, no TMG  Cardiovascular:  Normal rate, regular rhythm, no rubs or gallops.  No murmurs        Intact distal pulses  Pulmonary :  Normal breath sounds, no stridor or respiratory distress  No rales, rhonchi, or wheezing  Abdominal:  Soft, nondistended, bowel sounds present.  No tenderness noted.   Musculoskeletal:  No lower extremity edema noted.  Lymph Nodes:  No cervical lymphadenopathy noted  Skin:  No cyanosis noted  Neurologic:  Alert, appropriate, moves all 4 extremities without obvious deficit.         Assessment & Plan:

## 2013-07-13 NOTE — Patient Instructions (Signed)
Will set up on cpap at a moderate pressure level.  Please call if having tolerance issues. Work on weight loss followup with me in 8 weeks.

## 2013-07-13 NOTE — Assessment & Plan Note (Signed)
The patient has moderate obstructive sleep apnea by his recent sleep study, and is clearly having disrupted sleep but denies having a significant impact on his daytime alertness. I have reviewed the pathophysiology of sleep apnea with him, including its impact to his quality of life and cardiovascular health. I have outlined a conservative treatment regimen with a trial of weight loss alone, but have recommended that he treat this more aggressively with CPAP since he is not satisfied with his sleep quality. I have also asked him to work aggressively on weight loss. He is willing to try CPAP, and we'll make the referral to a home care company.

## 2013-08-31 ENCOUNTER — Encounter: Payer: Self-pay | Admitting: Internal Medicine

## 2013-08-31 DIAGNOSIS — K2100 Gastro-esophageal reflux disease with esophagitis, without bleeding: Secondary | ICD-10-CM | POA: Insufficient documentation

## 2013-08-31 DIAGNOSIS — Z79899 Other long term (current) drug therapy: Secondary | ICD-10-CM | POA: Insufficient documentation

## 2013-08-31 NOTE — Progress Notes (Signed)
Patient ID: Craig [name], male   DOB: 1965-12-30, 48 y.o.   MRN: [id number]      ERROR

## 2013-09-07 ENCOUNTER — Ambulatory Visit: Payer: 59 | Admitting: Pulmonary Disease

## 2013-11-02 ENCOUNTER — Other Ambulatory Visit: Payer: Self-pay | Admitting: Internal Medicine

## 2013-11-02 MED ORDER — LISINOPRIL-HYDROCHLOROTHIAZIDE 20-12.5 MG PO TABS: 1.0000 | ORAL_TABLET | Freq: Every day | ORAL | Status: DC

## 2013-11-28 ENCOUNTER — Encounter: Payer: Self-pay | Admitting: Internal Medicine

## 2013-11-28 DIAGNOSIS — Z0000 Encounter for general adult medical examination without abnormal findings: Secondary | ICD-10-CM

## 2013-11-28 NOTE — Progress Notes (Signed)
Patient ID: Craig [name], male   DOB: [birth year], 48 y.o.   MRN: [id number]   Craig Andreas  [name]

## 2013-12-04 ENCOUNTER — Encounter: Payer: Self-pay | Admitting: Internal Medicine

## 2013-12-18 ENCOUNTER — Encounter: Payer: Self-pay | Admitting: Internal Medicine

## 2013-12-18 ENCOUNTER — Ambulatory Visit (INDEPENDENT_AMBULATORY_CARE_PROVIDER_SITE_OTHER): Payer: 59 | Admitting: Internal Medicine

## 2013-12-18 VITALS — BP 118/80 | HR 64 | Temp 98.2°F | Resp 16 | Ht 73.0 in | Wt 234.0 lb

## 2013-12-18 DIAGNOSIS — Z1212 Encounter for screening for malignant neoplasm of rectum: Secondary | ICD-10-CM

## 2013-12-18 DIAGNOSIS — R7402 Elevation of levels of lactic acid dehydrogenase (LDH): Secondary | ICD-10-CM

## 2013-12-18 DIAGNOSIS — R7401 Elevation of levels of liver transaminase levels: Secondary | ICD-10-CM

## 2013-12-18 DIAGNOSIS — R740 Nonspecific elevation of levels of transaminase and lactic acid dehydrogenase [LDH]: Secondary | ICD-10-CM

## 2013-12-18 DIAGNOSIS — Z125 Encounter for screening for malignant neoplasm of prostate: Secondary | ICD-10-CM

## 2013-12-18 DIAGNOSIS — Z0000 Encounter for general adult medical examination without abnormal findings: Secondary | ICD-10-CM

## 2013-12-18 DIAGNOSIS — E559 Vitamin D deficiency, unspecified: Secondary | ICD-10-CM

## 2013-12-18 DIAGNOSIS — I10 Essential (primary) hypertension: Secondary | ICD-10-CM

## 2013-12-18 DIAGNOSIS — Z113 Encounter for screening for infections with a predominantly sexual mode of transmission: Secondary | ICD-10-CM

## 2013-12-18 DIAGNOSIS — Z111 Encounter for screening for respiratory tuberculosis: Secondary | ICD-10-CM

## 2013-12-18 LAB — CBC WITH DIFFERENTIAL/PLATELET
BASOS PCT: 0 % (ref 0–1)
Basophils Absolute: 0 10*3/uL (ref 0.0–0.1)
EOS PCT: 2 % (ref 0–5)
Eosinophils Absolute: 0.1 10*3/uL (ref 0.0–0.7)
HEMATOCRIT: 44.6 % (ref 39.0–52.0)
HEMOGLOBIN: 15 g/dL (ref 13.0–17.0)
Lymphocytes Relative: 35 % (ref 12–46)
Lymphs Abs: 2.1 10*3/uL (ref 0.7–4.0)
MCH: 27.2 pg (ref 26.0–34.0)
MCHC: 33.6 g/dL (ref 30.0–36.0)
MCV: 80.9 fL (ref 78.0–100.0)
MONO ABS: 0.5 10*3/uL (ref 0.1–1.0)
MONOS PCT: 9 % (ref 3–12)
NEUTROS ABS: 3.3 10*3/uL (ref 1.7–7.7)
Neutrophils Relative %: 54 % (ref 43–77)
Platelets: 244 10*3/uL (ref 150–400)
RBC: 5.51 MIL/uL (ref 4.22–5.81)
RDW: 15 % (ref 11.5–15.5)
WBC: 6.1 10*3/uL (ref 4.0–10.5)

## 2013-12-18 LAB — HEMOGLOBIN A1C
HEMOGLOBIN A1C: 6 % — AB (ref <5.7)
MEAN PLASMA GLUCOSE: 126 mg/dL — AB (ref <117)

## 2013-12-18 NOTE — Patient Instructions (Signed)

## 2013-12-18 NOTE — Progress Notes (Signed)
Patient ID: Craig [name], male   DOB: Feb 29, 1966, 48 y.o.   MRN: [id number]   This very nice 48 y.o.MBM presents for 3 month follow up with Hypertension, Hyperlipidemia, Pre-Diabetes and Vitamin D Deficiency.    Patient is treated for HTN since July 2013 & BP has been controlled at home. Today's BP: 118/80 mmHg. Patient denies any cardiac type chest pain, palpitations, dyspnea/orthopnea/PND, dizziness, claudication, or dependent edema.   Hyperlipidemia is controlled with diet & meds. Patient denies myalgias or other med SE's. Last Lipids were at goal for total Chol and near goal for ldl  Lab Results  Component Value Date   CHOL 164 05/31/2013   HDL 42 05/31/2013   LDLCALC 107* 05/31/2013   TRIG 73 05/31/2013   CHOLHDL 3.9 05/31/2013    Also, the patient has history of PreDiabetes since July 2013 with A1c 5.9% & insulin 40 and last A1c was 6.1% in Jan 2015. Patient denies any symptoms of reactive hypoglycemia, diabetic polys, paresthesias or visual blurring.   Further, Patient has history of Vitamin D Deficiency of 16 in July 2013  and last vitamin D was 37 in Jan 2015. Patient supplements vitamin D without any suspected side-effects.   Medication List   lisinopril-hydrochlorothiazide 20-12.5 MG per tablet  Commonly known as:  ZESTORETIC  Take 1 tablet by mouth daily.     Magnesium 500 MG Caps  Take 500 mg by mouth daily.     NEXIUM 20 MG capsule  Generic drug:  esomeprazole  Take 20 mg by mouth daily as needed.     Vitamin D-3 5000 UNITS Tabs  Take 10,000 Units by mouth every other day.      Allergies  Allergen Reactions  . Ziac [Bisoprolol-Hydrochlorothiazide]     Erectile dysfunction   PMHx:   Past Medical History  Diagnosis Date  . Hypertension   . Hyperlipidemia   . Hypogonadism male   . Pre-diabetes   . Vertigo     FHx:    Reviewed / unchanged SHx:    Reviewed / unchanged  Systems Review:  Constitutional: Denies fever, chills, wt changes, headaches, insomnia,  fatigue, night sweats, change in appetite. Eyes: Denies redness, blurred vision, diplopia, discharge, itchy, watery eyes.  ENT: Denies discharge, congestion, post nasal drip, epistaxis, sore throat, earache, hearing loss, dental pain, tinnitus, vertigo, sinus pain, snoring.  CV: Denies chest pain, palpitations, irregular heartbeat, syncope, dyspnea, diaphoresis, orthopnea, PND, claudication or edema. Respiratory: denies cough, dyspnea, DOE, pleurisy, hoarseness, laryngitis, wheezing.  Gastrointestinal: Denies dysphagia, odynophagia, heartburn, reflux, water brash, abdominal pain or cramps, nausea, vomiting, bloating, diarrhea, constipation, hematemesis, melena, hematochezia  or hemorrhoids. Genitourinary: Denies dysuria, frequency, urgency, nocturia, hesitancy, discharge, hematuria or flank pain. Musculoskeletal: Denies arthralgias, myalgias, stiffness, jt. swelling, pain, limping or strain/sprain.  Skin: Denies pruritus, rash, hives, warts, acne, eczema or change in skin lesion(s). Neuro: No weakness, tremor, incoordination, spasms, paresthesia or pain. Psychiatric: Denies confusion, memory loss or sensory loss. Endo: Denies change in weight, skin or hair change.  Heme/Lymph: No excessive bleeding, bruising or enlarged lymph nodes.  Exam:  BP 118/80  Pulse 64  Temp(Src) 98.2 F (36.8 C) (Temporal)  Resp 16  Ht 6\' 1"  (1.854 m)  Wt 234 lb (106.142 kg)  BMI 30.88 kg/m2  Appears well nourished and in no distress. Eyes: PERRLA, EOMs, conjunctiva no swelling or erythema. Sinuses: No frontal/maxillary tenderness ENT/Mouth: EAC's clear, TM's nl w/o erythema, bulging. Nares clear w/o erythema, swelling, exudates. Oropharynx clear without erythema or exudates.  Oral hygiene is good. Tongue normal, non obstructing. Hearing intact.  Neck: Supple. Thyroid nl. Car 2+/2+ without bruits, nodes or JVD. Chest: Respirations nl with BS clear & equal w/o rales, rhonchi, wheezing or stridor.  Cor: Heart  sounds normal w/ regular rate and rhythm without sig. murmurs, gallops, clicks, or rubs. Peripheral pulses normal and equal  without edema.  Abdomen: Soft & bowel sounds normal. Non-tender w/o guarding, rebound, hernias, masses, or organomegaly.  Lymphatics: Unremarkable.  Musculoskeletal: Full ROM all peripheral extremities, joint stability, 5/5 strength, and normal gait.  Skin: Warm, dry without exposed rashes, lesions or ecchymosis apparent.  Neuro: Cranial nerves intact, reflexes equal bilaterally. Sensory-motor testing grossly intact. Tendon reflexes grossly intact.  Pysch: Alert & oriented x 3. Insight and judgement nl & appropriate. No ideations.  Assessment and Plan:  1. Hypertension - Continue monitor blood pressure at home. Continue diet/meds same.  2. Hyperlipidemia - Continue diet/meds, exercise,& lifestyle modifications. Continue monitor periodic cholesterol/liver & renal functions   3. Pre-Diabetes - Continue diet, exercise, lifestyle modifications. Monitor appropriate labs.  4. Vitamin D Deficiency - Continue supplementation.  5. OSA  6. GERD  7. Vertigo, Benign Positional  Recommended regular exercise, BP monitoring, weight control, and discussed med and SE's. Recommended labs to assess and monitor clinical status. Further disposition pending results of labs.

## 2013-12-19 LAB — MICROALBUMIN / CREATININE URINE RATIO
Creatinine, Urine: 33.4 mg/dL
MICROALB/CREAT RATIO: 15 mg/g (ref 0.0–30.0)
Microalb, Ur: 0.5 mg/dL (ref 0.00–1.89)

## 2013-12-19 LAB — URINALYSIS, MICROSCOPIC ONLY
BACTERIA UA: NONE SEEN
CRYSTALS: NONE SEEN
Casts: NONE SEEN
Squamous Epithelial / LPF: NONE SEEN

## 2013-12-19 LAB — RPR

## 2013-12-19 LAB — BASIC METABOLIC PANEL WITH GFR
BUN: 16 mg/dL (ref 6–23)
CHLORIDE: 97 meq/L (ref 96–112)
CO2: 27 mEq/L (ref 19–32)
Calcium: 9.4 mg/dL (ref 8.4–10.5)
Creat: 1.43 mg/dL — ABNORMAL HIGH (ref 0.50–1.35)
GFR, EST AFRICAN AMERICAN: 67 mL/min
GFR, Est Non African American: 58 mL/min — ABNORMAL LOW
GLUCOSE: 85 mg/dL (ref 70–99)
POTASSIUM: 3.8 meq/L (ref 3.5–5.3)
Sodium: 137 mEq/L (ref 135–145)

## 2013-12-19 LAB — LIPID PANEL
Cholesterol: 172 mg/dL (ref 0–200)
HDL: 41 mg/dL (ref >39)
LDL CALC: 103 mg/dL — AB (ref 0–99)
Total CHOL/HDL Ratio: 4.2 Ratio
Triglycerides: 138 mg/dL (ref <150)
VLDL: 28 mg/dL (ref 0–40)

## 2013-12-19 LAB — VITAMIN B12: Vitamin B-12: 305 pg/mL (ref 211–911)

## 2013-12-19 LAB — HEPATIC FUNCTION PANEL
ALK PHOS: 48 U/L (ref 39–117)
ALT: 37 U/L (ref 0–53)
AST: 31 U/L (ref 0–37)
Albumin: 4.5 g/dL (ref 3.5–5.2)
Bilirubin, Direct: 0.2 mg/dL (ref 0.0–0.3)
Indirect Bilirubin: 0.5 mg/dL (ref 0.2–1.2)
TOTAL PROTEIN: 7.4 g/dL (ref 6.0–8.3)
Total Bilirubin: 0.7 mg/dL (ref 0.2–1.2)

## 2013-12-19 LAB — HEPATITIS A ANTIBODY, TOTAL: Hep A Total Ab: NONREACTIVE

## 2013-12-19 LAB — MAGNESIUM: Magnesium: 2 mg/dL (ref 1.5–2.5)

## 2013-12-19 LAB — PSA: PSA: 0.95 ng/mL (ref <=4.00)

## 2013-12-19 LAB — INSULIN, FASTING: Insulin fasting, serum: 33 u[IU]/mL — ABNORMAL HIGH (ref 3–28)

## 2013-12-19 LAB — HEPATITIS C ANTIBODY: HCV Ab: NEGATIVE

## 2013-12-19 LAB — TSH: TSH: 2.498 u[IU]/mL (ref 0.350–4.500)

## 2013-12-19 LAB — HEPATITIS B CORE ANTIBODY, TOTAL: HEP B C TOTAL AB: NONREACTIVE

## 2013-12-19 LAB — VITAMIN D 25 HYDROXY (VIT D DEFICIENCY, FRACTURES): Vit D, 25-Hydroxy: 58 ng/mL (ref 30–89)

## 2013-12-19 LAB — HIV ANTIBODY (ROUTINE TESTING W REFLEX): HIV 1&2 Ab, 4th Generation: NONREACTIVE

## 2013-12-19 LAB — TESTOSTERONE: Testosterone: 354 ng/dL (ref 300–890)

## 2013-12-19 LAB — HEPATITIS B SURFACE ANTIBODY,QUALITATIVE: Hep B S Ab: POSITIVE — AB

## 2013-12-20 LAB — HEPATITIS B E ANTIBODY: Hepatitis Be Antibody: NONREACTIVE

## 2013-12-21 LAB — TB SKIN TEST
Induration: 0 mm
TB Skin Test: NEGATIVE

## 2013-12-31 ENCOUNTER — Other Ambulatory Visit: Payer: Self-pay | Admitting: Internal Medicine

## 2014-01-08 ENCOUNTER — Telehealth: Payer: Self-pay | Admitting: *Deleted

## 2014-01-08 NOTE — Telephone Encounter (Signed)
Pt aware of lab results 

## 2014-01-25 ENCOUNTER — Other Ambulatory Visit: Payer: Self-pay | Admitting: *Deleted

## 2014-01-25 ENCOUNTER — Telehealth: Payer: Self-pay | Admitting: *Deleted

## 2014-01-25 MED ORDER — LISINOPRIL-HYDROCHLOROTHIAZIDE 20-12.5 MG PO TABS: ORAL_TABLET | ORAL | Status: DC

## 2014-01-25 NOTE — Telephone Encounter (Signed)
Patient called requesting we fax refill for Lisinopril-HCTZ to [facility] at fax # [phone].  Rx faxed.

## 2014-04-01 ENCOUNTER — Ambulatory Visit (INDEPENDENT_AMBULATORY_CARE_PROVIDER_SITE_OTHER): Payer: 59 | Admitting: Physician Assistant

## 2014-04-01 ENCOUNTER — Encounter: Payer: Self-pay | Admitting: Physician Assistant

## 2014-04-01 VITALS — BP 120/80 | HR 72 | Temp 98.2°F | Resp 16 | Ht 73.0 in | Wt 232.0 lb

## 2014-04-01 DIAGNOSIS — R7303 Prediabetes: Secondary | ICD-10-CM

## 2014-04-01 DIAGNOSIS — E559 Vitamin D deficiency, unspecified: Secondary | ICD-10-CM

## 2014-04-01 DIAGNOSIS — Z79899 Other long term (current) drug therapy: Secondary | ICD-10-CM

## 2014-04-01 DIAGNOSIS — R5383 Other fatigue: Secondary | ICD-10-CM

## 2014-04-01 DIAGNOSIS — M26609 Unspecified temporomandibular joint disorder, unspecified side: Secondary | ICD-10-CM

## 2014-04-01 DIAGNOSIS — I10 Essential (primary) hypertension: Secondary | ICD-10-CM

## 2014-04-01 DIAGNOSIS — G4733 Obstructive sleep apnea (adult) (pediatric): Secondary | ICD-10-CM

## 2014-04-01 DIAGNOSIS — H8113 Benign paroxysmal vertigo, bilateral: Secondary | ICD-10-CM

## 2014-04-01 DIAGNOSIS — E782 Mixed hyperlipidemia: Secondary | ICD-10-CM

## 2014-04-01 MED ORDER — MOMETASONE FUROATE 50 MCG/ACT NA SUSP: 2.0000 | Freq: Every day | NASAL | Status: DC

## 2014-04-01 MED ORDER — TIZANIDINE HCL 4 MG PO CAPS: 4.0000 mg | ORAL_CAPSULE | ORAL | Status: DC | PRN

## 2014-04-01 NOTE — Patient Instructions (Signed)
-Please take Tylenol or Ibuprofen for pain. Please pick one of the over the counter allergy medications below and take it once daily for allergies.  Claritin or loratadine cheapest but likely the weakest  Zyrtec or certizine at night because it can make you sleepy The strongest is allegra or fexafinadine  Cheapest at [employer], [employer], [employer] -While drinking fluids, pinch and hold nose close and swallow.  This will help open up your eustachian tubes to drain the fluid behind your ear drums. -Try steam showers to open your nasal passages.   Drink lots of water to stay hydrated and to thin mucous. Flonase/Nasonex is to help the inflammation.  Take 2 sprays in each nostril at bedtime.  Make sure you spray towards the outside of each nostril towards the outer corner of your eye, hold nose close and tilt head back.  This will help the medication get into your sinuses.  If you do not like this medication, then use saline nasal sprays same directions as above for Flonase.  -It can take up to 2 weeks to feel better.   -If you do not get better in 7-10 days (Have fever, facial pain, dental pain and swelling), then please call the office and I can send in an antibiotic.  What is the TMJ? The temporomandibular (tem-PUH-ro-man-DIB-yoo-ler) joint, or the TMJ, connects the upper and lower jawbones. This joint allows the jaw to open wide and move back and forth when you chew, talk, or yawn.There are also several muscles that help this joint move. There can be muscle tightness and pain in the muscle that can cause several symptoms.  What causes TMJ pain? There are many causes of TMJ pain. Repeated chewing (for example, chewing gum) and clenching your teeth can cause pain in the joint. Some TMJ pain has no obvious cause. What can I do to ease the pain? There are many things you can do to help your pain get better. When you have pain:  Eat soft foods and stay away from chewy foods (for example, taffy) Try to use  both sides of your mouth to chew Don't chew gum Don't open your mouth wide (for example, during yawning or singing) Don't bite your cheeks or fingernails Lower your amount of stress and worry Applying a warm, damp washcloth to the joint may help. Over-the-counter pain medicines such as ibuprofen (one brand: Advil) or acetaminophen (one brand: Tylenol) might also help. Do not use these medicines if you are allergic to them or if your doctor told you not to use them. How can I stop the pain from coming back? When your pain is better, you can do these exercises to make your muscles stronger and to keep the pain from coming back:  Resisted mouth opening: Place your thumb or two fingers under your chin and open your mouth slowly, pushing up lightly on your chin with your thumb. Hold for three to six seconds. Close your mouth slowly. Resisted mouth closing: Place your thumbs under your chin and your two index fingers on the ridge between your mouth and the bottom of your chin. Push down lightly on your chin as you close your mouth. Tongue up: Slowly open and close your mouth while keeping the tongue touching the roof of the mouth. Side-to-side jaw movement: Place an object about one fourth of an inch thick (for example, two tongue depressors) between your front teeth. Slowly move your jaw from side to side. Increase the thickness of the object as the exercise  becomes easier Forward jaw movement: Place an object about one fourth of an inch thick between your front teeth and move the bottom jaw forward so that the bottom teeth are in front of the top teeth. Increase the thickness of the object as the exercise becomes easier. These exercises should not be painful. If it hurts to do these exercises, stop doing them and talk to your family doctor.     Bad carbs also include fruit juice, alcohol, and sweet tea. These are empty calories that do not signal to your brain that you are full.   Please remember  the good carbs are still carbs which convert into sugar. So please measure them out no more than 1/2-1 cup of rice, oatmeal, pasta, and beans  Veggies are however free foods! Pile them on.   Not all fruit is created equal. Please see the list below, the fruit at the bottom is higher in sugars than the fruit at the top. Please avoid all dried fruits.

## 2014-04-01 NOTE — Progress Notes (Addendum)
Assessment and Plan:  Hypertension: Continue medication, monitor blood pressure at home. Continue DASH diet.  Reminder to go to the ER if any CP, SOB, nausea, dizziness, severe HA, changes vision/speech, left arm numbness and tingling, and jaw pain. Cholesterol: Continue diet and exercise. Check cholesterol.  Pre-diabetes-Continue diet and exercise. Check A1C Vitamin D Def- check level and continue medications.  Vertigo- likely inner ear- nasonex, hold nose while drinking fluids, get on allergy pill.  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  Fatigue- ? From CPAP. Check labs including testosterone, B12, get back on CPAP.   Continue diet and meds as discussed. Further disposition pending results of labs. Addendum: + leukocytosis with left shift, sent in Zpak, will call office if any worsening fever, chills. HA, etc.  + low testosterone- will recheck and if low patient will decide if he would like treatment, sent natural ways to increase testosterone.   HPI 48 y.o. male  presents for 3 month follow up with hypertension, hyperlipidemia, prediabetes and vitamin D. His blood pressure has been controlled at home, today their BP is BP: 120/80 mmHg He does workout, walks in his neighborhood. He denies chest pain, shortness of breath, dizziness.  He is not on cholesterol medication and denies myalgias. His cholesterol is at goal. The cholesterol last visit was:   Lab Results  Component Value Date   CHOL 172 12/18/2013   HDL 41 12/18/2013   LDLCALC 103* 12/18/2013   TRIG 138 12/18/2013   CHOLHDL 4.2 12/18/2013   He has been working on diet and exercise for prediabetes, he is on cinnamon once daily and denies paresthesia of the feet, polydipsia, polyuria and visual disturbances. Last A1C in the office was:  Lab Results  Component Value Date   HGBA1C 6.0*  12/18/2013   Patient is on Vitamin D supplement.   Lab Results  Component Value Date   VD25OH 61 12/18/2013     He has a history of vertigo due to inner ear, he states for the past 2 week he has worse with getting out of bed/turning head, he states he has vomited once, he called his ENT Dr. Simeon Craft and was sent in prednisone and meclizine, he states it has gotten much better but he is having right ear pain. Has some post nasal drainage. Denies fever, chills.  He is not on CPAP right now, waiting for [facility] hospital.  He states he has been more fatigued.   Current Medications:  Current Outpatient Prescriptions on File Prior to Visit  Medication Sig Dispense Refill  . Cholecalciferol (VITAMIN D-3) 5000 UNITS TABS Take 10,000 Units by mouth every other day.     . esomeprazole (NEXIUM) 20 MG capsule Take 20 mg by mouth daily as needed.    Marland Kitchen lisinopril-hydrochlorothiazide (PRINZIDE,ZESTORETIC) 20-12.5 MG per tablet TAKE 1 TABLET BY MOUTH ONCE A DAY FOR BLOOD PRESSURE 90 tablet 2  . Magnesium 500 MG CAPS Take 500 mg by mouth daily.     No current facility-administered medications on file prior to visit.   Medical History:  Past Medical History  Diagnosis Date  . Hypertension   . Hyperlipidemia   . Hypogonadism male   . Pre-diabetes   . Vertigo    Allergies:  Allergies  Allergen Reactions  . Ziac [Bisoprolol-Hydrochlorothiazide]     Erectile dysfunction     Review of Systems: [X]  = complains of  [ ]  =  denies  General: Fatigue [ ]  Fever [ ]  Chills [ ]  Weakness [ ]   Insomnia [ ]  Eyes: Redness [ ]  Blurred vision [ ]  Diplopia [ ]   ENT: Congestion [ ]  Sinus Pain [ ]  Post Nasal Drip [x ] Sore Throat [ ]  Earache [x ]  Cardiac: Chest pain/pressure [ ]  SOB [ ]  Orthopnea [ ]   Palpitations [ ]   Paroxysmal nocturnal dyspnea[ ]  Claudication [ ]  Edema [ ]   Pulmonary: Cough [ ]  Wheezing[ ]   SOB [ ]   Snoring [ ]   GI: Nausea [ ]  Vomiting[ ]  Dysphagia[ ]  Heartburn[ ]  Abdominal pain [ ]  Constipation [ ] ;  Diarrhea [ ] ; BRBPR [ ]  Melena[ ]  GU: Hematuria[ ]  Dysuria [ ]  Nocturia[ ]  Urgency [ ]   Hesitancy [ ]  Discharge [ ]  Neuro: Headaches[ ]  Vertigo[x ] Paresthesias[ ]  Spasm [ ]  Speech changes [ ]  Incoordination [ ]   Ortho: Arthritis [ ]  Joint pain [ ]  Muscle/neck pain [x ] Joint swelling [ ]  Back Pain [ ]  Skin:  Rash [ ]   Pruritis [ ]  Change in skin lesion [ ]   Psych: Depression[ ]  Anxiety[ ]  Confusion [ ]  Memory loss [ ]   Heme/Lypmh: Bleeding [ ]  Bruising [ ]  Enlarged lymph nodes [ ]   Endocrine: Visual blurring [ ]  Paresthesia [ ]  Polyuria [ ]  Polydypsea [ ]    Heat/cold intolerance [ ]  Hypoglycemia [ ]   Family history- Review and unchanged Social history- Review and unchanged Physical Exam: BP 120/80 mmHg  Pulse 72  Temp(Src) 98.2 F (36.8 C)  Resp 16  Ht 6\' 1"  (1.854 m)  Wt 232 lb (105.235 kg)  BMI 30.62 kg/m2 Wt Readings from Last 3 Encounters:  04/01/14 232 lb (105.235 kg)  12/18/13 234 lb (106.142 kg)  07/13/13 215 lb (97.523 kg)   General Appearance: Well nourished, in no apparent distress. Eyes: PERRLA, EOMs, conjunctiva no swelling or erythema Sinuses: No Frontal/maxillary tenderness ENT/Mouth: Ext aud canals clear, TMs without erythema, + effusion and scarring right ear.  No erythema, swelling, or exudate on post pharynx.  Tonsils not swollen or erythematous. + TMJ tenderness right worse than left.  Neck: Supple, thyroid normal.  Respiratory: Respiratory effort normal, BS equal bilaterally without rales, rhonchi, wheezing or stridor.  Cardio: RRR with no MRGs. Brisk peripheral pulses without edema.  Abdomen: Soft, + BS.  Non tender, no guarding, rebound, hernias, masses. Lymphatics: Non tender without lymphadenopathy.  Musculoskeletal: Full ROM, 5/5 strength, normal gait.  Skin: Warm, dry without rashes, lesions, ecchymosis.  Neuro: Cranial nerves intact. Normal muscle tone, no cerebellar symptoms. Sensation intact.  Psych: Awake and oriented X 3, normal affect, Insight  and Judgment appropriate.    Vicie Mutters, PA-C 4:57 PM [facility]

## 2014-04-02 ENCOUNTER — Encounter: Payer: Self-pay | Admitting: Physician Assistant

## 2014-04-02 LAB — HEMOGLOBIN A1C
HEMOGLOBIN A1C: 6.3 % — AB (ref <5.7)
Mean Plasma Glucose: 134 mg/dL — ABNORMAL HIGH (ref <117)

## 2014-04-02 LAB — CBC WITH DIFFERENTIAL/PLATELET
BASOS PCT: 0 % (ref 0–1)
Basophils Absolute: 0 10*3/uL (ref 0.0–0.1)
EOS ABS: 0 10*3/uL (ref 0.0–0.7)
Eosinophils Relative: 0 % (ref 0–5)
HCT: 44.8 % (ref 39.0–52.0)
HEMOGLOBIN: 15.4 g/dL (ref 13.0–17.0)
Lymphocytes Relative: 15 % (ref 12–46)
Lymphs Abs: 2.2 10*3/uL (ref 0.7–4.0)
MCH: 27.4 pg (ref 26.0–34.0)
MCHC: 34.4 g/dL (ref 30.0–36.0)
MCV: 79.7 fL (ref 78.0–100.0)
MONOS PCT: 5 % (ref 3–12)
Monocytes Absolute: 0.7 10*3/uL (ref 0.1–1.0)
NEUTROS ABS: 11.7 10*3/uL — AB (ref 1.7–7.7)
Neutrophils Relative %: 80 % — ABNORMAL HIGH (ref 43–77)
Platelets: 315 10*3/uL (ref 150–400)
RBC: 5.62 MIL/uL (ref 4.22–5.81)
RDW: 15.3 % (ref 11.5–15.5)
WBC: 14.6 10*3/uL — ABNORMAL HIGH (ref 4.0–10.5)

## 2014-04-02 LAB — BASIC METABOLIC PANEL WITH GFR
BUN: 21 mg/dL (ref 6–23)
CO2: 26 mEq/L (ref 19–32)
Calcium: 9.8 mg/dL (ref 8.4–10.5)
Chloride: 99 mEq/L (ref 96–112)
Creat: 1.27 mg/dL (ref 0.50–1.35)
GFR, EST AFRICAN AMERICAN: 78 mL/min
GFR, Est Non African American: 67 mL/min
GLUCOSE: 101 mg/dL — AB (ref 70–99)
POTASSIUM: 4.2 meq/L (ref 3.5–5.3)
Sodium: 138 mEq/L (ref 135–145)

## 2014-04-02 LAB — HEPATIC FUNCTION PANEL
ALT: 31 U/L (ref 0–53)
AST: 17 U/L (ref 0–37)
Albumin: 4.6 g/dL (ref 3.5–5.2)
Alkaline Phosphatase: 46 U/L (ref 39–117)
BILIRUBIN INDIRECT: 0.3 mg/dL (ref 0.2–1.2)
Bilirubin, Direct: 0.1 mg/dL (ref 0.0–0.3)
TOTAL PROTEIN: 7.3 g/dL (ref 6.0–8.3)
Total Bilirubin: 0.4 mg/dL (ref 0.2–1.2)

## 2014-04-02 LAB — TSH: TSH: 0.65 u[IU]/mL (ref 0.350–4.500)

## 2014-04-02 LAB — VITAMIN D 25 HYDROXY (VIT D DEFICIENCY, FRACTURES): VIT D 25 HYDROXY: 90 ng/mL — AB (ref 30–89)

## 2014-04-02 LAB — LIPID PANEL
Cholesterol: 154 mg/dL (ref 0–200)
HDL: 64 mg/dL (ref >39)
LDL CALC: 74 mg/dL (ref 0–99)
TRIGLYCERIDES: 80 mg/dL (ref <150)
Total CHOL/HDL Ratio: 2.4 Ratio
VLDL: 16 mg/dL (ref 0–40)

## 2014-04-02 LAB — INSULIN, FASTING: Insulin fasting, serum: 59.4 u[IU]/mL — ABNORMAL HIGH (ref 2.0–19.6)

## 2014-04-02 LAB — MAGNESIUM: MAGNESIUM: 2.1 mg/dL (ref 1.5–2.5)

## 2014-04-02 LAB — TESTOSTERONE: Testosterone: 265 ng/dL — ABNORMAL LOW (ref 300–890)

## 2014-04-02 LAB — VITAMIN B12: Vitamin B-12: 341 pg/mL (ref 211–911)

## 2014-04-02 MED ORDER — AZITHROMYCIN 250 MG PO TABS: ORAL_TABLET | ORAL | Status: AC

## 2014-04-02 NOTE — Addendum Note (Signed)
Addended by: Vicie Mutters R on: 04/02/2014 08:50 AM   Modules accepted: Orders

## 2014-06-20 ENCOUNTER — Telehealth: Payer: Self-pay | Admitting: Internal Medicine

## 2014-06-20 NOTE — Telephone Encounter (Signed)
    lisinopril-hydrochlorothiazide (PRINZIDE,ZESTORETIC) 20-12.5 MG per tablet [[id number]]   Renewal request  Thank you, Katrina Judeth Horn [facility] [phone] Fax [phone]

## 2014-06-22 MED ORDER — LISINOPRIL-HYDROCHLOROTHIAZIDE 20-12.5 MG PO TABS: ORAL_TABLET | ORAL | Status: DC

## 2014-07-08 ENCOUNTER — Ambulatory Visit: Payer: Self-pay | Admitting: Internal Medicine

## 2014-07-11 ENCOUNTER — Ambulatory Visit: Payer: Self-pay | Admitting: Orthopedic Surgery

## 2014-07-12 ENCOUNTER — Ambulatory Visit: Payer: Self-pay | Admitting: Orthopedic Surgery

## 2014-07-12 NOTE — H&P (Signed)
Craig [name] DOB: 10-15-1965 Married / Language: English / Race: Black or African American Male  Chief Complaint back, left buttock, left leg pain  History of Present Illness The patient is a 49 year old male who presents today for follow up of their back. The patient is being followed for their low back symptoms. They are now 1 year(s) out from when symptoms began. Symptoms reported today include: pain. The patient states that they are doing poorly. Current treatment includes: home exercise program, activity modification and NSAIDs (Bayer Back & Body). The patient has reported improvement of their symptoms with: Cortisone injections. The patient indicates that they have questions or concerns today regarding surgery. Note for "Follow-up back": Craig [name] follows up for ongoing lower back tightness and L buttock/leg pain. He reports after his last ESI in September, he did get relief for about 3 months, but the pain returned while on vacation in [place] and he was limited in how much he could do. He has been dealing with the pain since then but at this point reports he is having pain daily and with most walking and standing and it is interfering with his life and ADLs. He is working as a [employer], will be retiring in 64 months, then likely working at [employer] where he will not need a duty belt. He currently spends a good deal of his day at his desk. Sitting, lying down, and forward flexion all alleviate his symptoms. His back pain is not severe, he describes it as more of a tightness. His more significant pain is what radiates into the left buttock, down the posterior thigh, into the lateral lower leg and lateral foot, typical S1 distribution. He rarely notes pain over the dorsal foot (L5 distribution). He denies weakness or numbness, denies groin pain, right sided symptoms. He is currently taking Bayer back and body, was previously taking tizanidine but he couldn't tolerate it. He  has had 2 ESI's L5-S1 left both with temporary relief, most recent with 3 months of relief prior to return of his symptoms. He reports he thinks his symptoms even started when he was in the [place] [place]. 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 He reports he did file a claim in 2010 regarding his lower back but it was apparently denied as he did not have evidence of ongoing tx for his back.  Allergies No Known Drug Allergies06/12/2013  Family History No pertinent family history First Degree Relatives  Social History Tobacco use Never smoker. never smoker  Medication History Bayer Back & Body Pain Ex St (500-32.5MG  Tablet, Oral) Active. Lisinopril-Hydrochlorothiazide (20-12.5MG  Tablet, Oral) Active. Magnesium (Oral) Specific dose unknown - Active. Vitamin D (Oral) Specific dose unknown - Active. Cin Quin (Oral) Specific dose unknown - Active. Medications Reconciled  Review of Systems General Not Present- Fever and Weight Loss. Skin Not Present- Rash. Cardiovascular Not Present- Chest Pain and Shortness of Breath. Male Genitourinary Not Present- Painful Urination. Musculoskeletal Present- Joint Pain and Muscle Pain. Not Present- Joint Swelling. Neurological Not Present- Difficulty with balance, Loss of bladder control, Loss of bowel control, Numbness and Weakness. Endocrine Not Present- Excessive Thirst and Excessive Urination. Hematology Not Present- Easy Bruising.  Vitals  07/09/2014 3:16 PM Weight: 225 lb Height: 73in Body Surface Area: 2.26 m Body Mass Index: 29.68 kg/m  BP: 149/74 (  Sitting, Right Arm, Standard)  Physical Exam General Mental Status -Alert. General Appearance-pleasant, appears comfortable(seated), Not in acute distress. Orientation-Oriented X3. Build & Nutrition-Well nourished and Well  developed. Posture-Leaning forward. Gait-Unassisted and unimpaired.  Abdomen Palpation/Percussion Tenderness - Abdomen is non-tender to palpation. Rigidity (guarding) - Abdomen is soft.  Peripheral Vascular Lower Extremity Palpation - Homan's sign - Bilateral - Negative (normal). Posterior tibial pulse - Bilateral - 2+. Dorsalis pedis pulse - Bilateral - 2+.  Neurologic Motor Strength - Hip Flexion - Bilateral - 5/5. Quadriceps - Bilateral - 5/5. Hamstrings - Bilateral - 5/5. Ankle Dorsiflexion - Bilateral - 5/5. Ankle Plantarflexion - Bilateral - 5/5. Extensor Hallucis Longus - Bilateral - 5/5. Sensation Lower Extremity - Bilateral - sensation is intact to light touch in the lower extremity. Reflexes Patellar Reflex - Bilateral - 2+. Achilles Reflex - Bilateral - 2+. Babinski - Bilateral - Babinski not present. Clonus - Bilateral - clonus not present.  Musculoskeletal Spine/Ribs/Pelvis  Lumbosacral Spine: Inspection and Palpation - Tenderness - no tenderness to palpation of the lumbar spinous processes, no tenderness to palpation of the left flank, no tenderness to palpation of right flank, no tenderness to palpation of left lumbar paraspinals, no tenderness to palpation of right lumbar paraspinals, no tenderness to palpation of the left buttock, no tenderness to palpation of the right buttock, no tenderness to palpation of the left greater trochanter, no tenderness to palpation of the right greater trochanter. Swelling - none. Surrounding tissue tension/texture is - soft. Sensation - normal. Other characteristics - no ecchymosis, no abnormal warmth, no erythema, no evidence of cellulitis. ROM - Flexion - mildly decreased range of motion. Extension - moderately decreased range of motion and painful. Special Testing - Lumbar - Left seated straight leg raise negative, Right seated straight leg raise negative. Lower Extremity  Right Lower Extremity: Right Hip - ROM - Full ROM of the hip  and pain-free. Right Knee - ROM - Full ROM of the knee and pain-free. Right Ankle - ROM - Full and pain-free ROM of the ankle. Left Lower Extremity: Left Hip - ROM - Full ROM of the hip and pain-free. Left Knee - ROM - Full ROM of the knee and pain-free. Left Ankle - ROM - Full and pain-free ROM of the ankle.  Lymphatic General Lymphatics Description - No Localized lymphadenopathy.  Imaging xrays from 10/2013 reviewed. R THA prosthesis maintained. L hip mild degenerative changes. DDD L5-S1, moderately severe with loss of disc height anterior spurring noted consistent with early autofusion, foraminal narrowing noted. No listhesis or instability.  MRI Lspine images and report from 10/2013 reviewed by Dr. Tonita Cong. Severe DDD L5-S1 with Modic type 2 changes with posterior disc/osteophyte complex and superimposed large central caudal extrusion which mildly displaces the left S1 root. Severe B/L neural foraminal narrowing with encroachment on L5 dorsal root ganglia.  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 We discussed relevant anatomy in detail. He does have significant DDD at L5-S1 without instability and with autofusion on his xrays, which does cause significant foraminal stenosis bilaterally, which would cause L5 symptoms, but he does not appear to be symptomatic in the L5 distribution. He does however continue  to be symptomatic in the L S1 distribution which is caused by the disc/osteophyte complex and extrusion displacing the S1 root. We discussed tx options in detail. He has had 2 ESI's with temporary relief which also  confirms his pain generator. He is interested in surgical options at this point given short term relief with injections and inability to tolerate activity with modifications. Given the S1 distribution of his symptoms which did respond to ESI's L5-S1 left and given his MRI findings listed above, we discussed the possibility of decompression L5-S1 left to alleviate his buttock and leg pain. We discussed that this would not address his underlying DDD and foraminal stenosis, as the surgical procedure for that would require a fusion, but would not recommend concomitant fusion currently given his current exam and symptoms in the S1 distribution as opposed to L5 distribution and minimal back pain. He also appears to already be autofusing at L5-S1 on his prior xrays. We discussed possible need for a fusion in the future if he would develop L5 symptoms or severe ongoing back pain. He does not have any right sided pain to warrant a bilateral decompression.  At this point, patient would like to proceed with surgery, microlumbar decompression L5-S1 left, given the duration of symptoms, findings on exam and MRI, and temporary relief from ESI's, reasonable to proceed with a limited decompression at this level at this point. Discussed the procedure itself as well as risks, complications, and alternatives, including but not limited to DVT, PE, infx, bleeding, failure of procedure, need for secondary procedure, nerve injury, ongoing pain/symptoms, dural tear, CSF leak, and anesthesia risk, even stroke or death. Also discussed typical post-op course as well as spine precautions: no lifting, bending, twisting, prolonged sitting x 6 weeks post-op. Discussed walking program, PT, gentle stretching post-op. All questions were answered. Patient desires to proceed with surgery. He does not require medical clearance. He would like to try to get scheduled ASAP. Declined pain medication. Continue activity modifications in the interim. He  will follow up 2 weeks post-op for suture removal and call with any questions or concerns in the interim.  I had an extensive discussion of the risks and benefits of the lumbar decompression with the patient including bleeding, infection, damage to neurovascular structures, epidural fibrosis, CSF leak requiring repair. We also discussed increase in pain, adjacent segment disease, recurrent disc herniation, need for future surgery including repeat decompression and/or fusion. We also discussed risks of postoperative hematoma, paralysis, anesthetic complications including DVT, PE, death, cardiopulmonary dysfunction. In addition, the perioperative and postoperative courses were discussed in detail including the rehabilitative time and return to functional activity and work. I provided the patient with an illustrated handout and utilized the appropriate surgical models.  Plan microlumbar decompression L5-S1 left  Signed electronically by Lacie Draft PA-C for Dr. Tonita Cong

## 2014-07-15 ENCOUNTER — Ambulatory Visit (INDEPENDENT_AMBULATORY_CARE_PROVIDER_SITE_OTHER): Payer: 59 | Admitting: Internal Medicine

## 2014-07-15 ENCOUNTER — Other Ambulatory Visit: Payer: Self-pay | Admitting: *Deleted

## 2014-07-15 ENCOUNTER — Encounter: Payer: Self-pay | Admitting: Internal Medicine

## 2014-07-15 VITALS — BP 108/76 | HR 72 | Temp 98.1°F | Resp 16 | Ht 73.0 in | Wt 240.1 lb

## 2014-07-15 DIAGNOSIS — R7303 Prediabetes: Secondary | ICD-10-CM

## 2014-07-15 DIAGNOSIS — I10 Essential (primary) hypertension: Secondary | ICD-10-CM

## 2014-07-15 DIAGNOSIS — E669 Obesity, unspecified: Secondary | ICD-10-CM | POA: Insufficient documentation

## 2014-07-15 DIAGNOSIS — R7309 Other abnormal glucose: Secondary | ICD-10-CM

## 2014-07-15 DIAGNOSIS — Z79899 Other long term (current) drug therapy: Secondary | ICD-10-CM

## 2014-07-15 DIAGNOSIS — E782 Mixed hyperlipidemia: Secondary | ICD-10-CM

## 2014-07-15 DIAGNOSIS — E559 Vitamin D deficiency, unspecified: Secondary | ICD-10-CM

## 2014-07-15 LAB — CBC WITH DIFFERENTIAL/PLATELET
Basophils Absolute: 0 10*3/uL (ref 0.0–0.1)
Basophils Relative: 0 % (ref 0–1)
Eosinophils Absolute: 0.2 10*3/uL (ref 0.0–0.7)
Eosinophils Relative: 3 % (ref 0–5)
HCT: 44.4 % (ref 39.0–52.0)
HEMOGLOBIN: 15 g/dL (ref 13.0–17.0)
Lymphocytes Relative: 32 % (ref 12–46)
Lymphs Abs: 2.6 10*3/uL (ref 0.7–4.0)
MCH: 27.7 pg (ref 26.0–34.0)
MCHC: 33.8 g/dL (ref 30.0–36.0)
MCV: 82.1 fL (ref 78.0–100.0)
MPV: 11.4 fL (ref 8.6–12.4)
Monocytes Absolute: 0.7 10*3/uL (ref 0.1–1.0)
Monocytes Relative: 9 % (ref 3–12)
NEUTROS PCT: 56 % (ref 43–77)
Neutro Abs: 4.6 10*3/uL (ref 1.7–7.7)
PLATELETS: 263 10*3/uL (ref 150–400)
RBC: 5.41 MIL/uL (ref 4.22–5.81)
RDW: 15.2 % (ref 11.5–15.5)
WBC: 8.2 10*3/uL (ref 4.0–10.5)

## 2014-07-15 LAB — HEMOGLOBIN A1C
Hgb A1c MFr Bld: 6.7 % — ABNORMAL HIGH (ref <5.7)
Mean Plasma Glucose: 146 mg/dL — ABNORMAL HIGH (ref <117)

## 2014-07-15 MED ORDER — CETIRIZINE HCL 10 MG PO TABS: 10.0000 mg | ORAL_TABLET | Freq: Every day | ORAL | Status: AC

## 2014-07-15 MED ORDER — TIZANIDINE HCL 4 MG PO CAPS: 4.0000 mg | ORAL_CAPSULE | ORAL | Status: DC | PRN

## 2014-07-15 MED ORDER — LISINOPRIL-HYDROCHLOROTHIAZIDE 20-12.5 MG PO TABS: ORAL_TABLET | ORAL | Status: DC

## 2014-07-15 MED ORDER — ESOMEPRAZOLE MAGNESIUM 20 MG PO CPDR: 20.0000 mg | DELAYED_RELEASE_CAPSULE | Freq: Every day | ORAL | Status: DC | PRN

## 2014-07-15 NOTE — Progress Notes (Signed)
Patient ID: Craig [name], male   DOB: 1966-03-18, 49 y.o.   MRN: [id number]   This very nice 49 y.o. DBM presents for 3 month follow up with Hypertension, Hyperlipidemia, Pre-Diabetes and Vitamin D Deficiency.    Patient is treated for HTN since July 2013 & BP has been controlled at home. Today's BP: 108/76 mmHg (left-108/76     right-112/78). Patient has had no complaints of any cardiac type chest pain, palpitations, dyspnea/orthopnea/PND, dizziness, claudication, or dependent edema.   Hyperlipidemia is controlled with diet & meds. Patient denies myalgias or other med SE's. Last Lipids were at goal - Total Chol 154; HDL  64; LDL 74; Trig 80 on 04/01/2014:   Also, the patient has history of Morbid Obesity (BMI 31.68) and consequent  PreDiabetes since July 2013 with A1c 5.9% and has had no symptoms of reactive hypoglycemia, diabetic polys, paresthesias or visual blurring.  Last A1c was  6.3% on  04/01/2014.   Further, the patient also has history of Vitamin D Deficiency and supplements vitamin D without any suspected side-effects. Last vitamin D was  90 on  04/01/2014.  Medication Sig  . Cholecalciferol (VITAMIN D-3) 5000 UNITS TABS Take 10,000 Units by mouth every other day.   . Magnesium 500 MG CAPS Take 500 mg by mouth daily.  . MECLIZINE HCL PO Take by mouth daily as needed.  . mometasone (NASONEX) 50 MCG/ACT nasal spray Place 2 sprays into the nose daily.  Marland Kitchen esomeprazole (NEXIUM) 20 MG capsule Take 20 mg by mouth daily as needed.  Marland Kitchen lisinopril-hydrochlorothiazide (PRINZIDE,ZESTORETIC) 20-12.5 MG per tablet TAKE 1 TABLET BY MOUTH ONCE A DAY FOR BLOOD PRESSURE  . tiZANidine (ZANAFLEX) 4 MG capsule Take 1 capsule (4 mg total) by mouth as needed for muscle spasms.   Allergies  Allergen Reactions  . Ziac [Bisoprolol-Hydrochlorothiazide]     Erectile dysfunction   PMHx:   Past Medical History  Diagnosis Date  . Hypertension   . Hyperlipidemia   . Hypogonadism male   . Pre-diabetes   .  Vertigo    Immunization History  Administered Date(s) Administered  . PPD Test 12/18/2013   Past Surgical History  Procedure Laterality Date  . Total hip arthroplasty Right   . Total hip arthroplasty    . Kidney surgery      CYST REMOVAL  . Cataract extraction Bilateral   . Bunionectomy Bilateral    FHx:    Reviewed / unchanged  SHx:    Reviewed / unchanged  Systems Review:  Constitutional: Denies fever, chills, wt changes, headaches, insomnia, fatigue, night sweats, change in appetite. Eyes: Denies redness, blurred vision, diplopia, discharge, itchy, watery eyes.  ENT: Denies discharge, congestion, post nasal drip, epistaxis, sore throat, earache, hearing loss, dental pain, tinnitus, vertigo, sinus pain, snoring.  CV: Denies chest pain, palpitations, irregular heartbeat, syncope, dyspnea, diaphoresis, orthopnea, PND, claudication or edema. Respiratory: denies cough, dyspnea, DOE, pleurisy, hoarseness, laryngitis, wheezing.  Gastrointestinal: Denies dysphagia, odynophagia, heartburn, reflux, water brash, abdominal pain or cramps, nausea, vomiting, bloating, diarrhea, constipation, hematemesis, melena, hematochezia  or hemorrhoids. Genitourinary: Denies dysuria, frequency, urgency, nocturia, hesitancy, discharge, hematuria or flank pain. Musculoskeletal: Denies arthralgias, myalgias, stiffness, jt. swelling, pain, limping or strain/sprain.  Skin: Denies pruritus, rash, hives, warts, acne, eczema or change in skin lesion(s). Neuro: No weakness, tremor, incoordination, spasms, paresthesia or pain. Psychiatric: Denies confusion, memory loss or sensory loss. Endo: Denies change in weight, skin or hair change.  Heme/Lymph: No excessive bleeding, bruising or enlarged lymph nodes.  Physical Exam  BP 108/76   Pulse 72  Temp 98.1 F   Resp 16  Ht 6\' 1"    Wt 240 lb      BMI 31.68  Appears well nourished and in no distress. Eyes: PERRLA, EOMs, conjunctiva no swelling or  erythema. Sinuses: No frontal/maxillary tenderness ENT/Mouth: EAC's clear, TM's nl w/o erythema, bulging. Nares clear w/o erythema, swelling, exudates. Oropharynx clear without erythema or exudates. Oral hygiene is good. Tongue normal, non obstructing. Hearing intact.  Neck: Supple. Thyroid nl. Car 2+/2+ without bruits, nodes or JVD. Chest: Respirations nl with BS clear & equal w/o rales, rhonchi, wheezing or stridor.  Cor: Heart sounds normal w/ regular rate and rhythm without sig. murmurs, gallops, clicks, or rubs. Peripheral pulses normal and equal  without edema.  Abdomen: Soft & bowel sounds normal. Non-tender w/o guarding, rebound, hernias, masses, or organomegaly.  Lymphatics: Unremarkable.  Musculoskeletal: Full ROM all peripheral extremities, joint stability, 5/5 strength, and normal gait.  Skin: Warm, dry without exposed rashes, lesions or ecchymosis apparent.  Neuro: Cranial nerves intact, reflexes equal bilaterally. Sensory-motor testing grossly intact. Tendon reflexes grossly intact.  Pysch: Alert & oriented x 3.  Insight and judgement nl & appropriate. No ideations.  Assessment and Plan:  1. Essential hypertension  - TSH  2. Hyperlipidemia  - Lipid panel  3. Morbid obesity   4. Prediabetes  - Hemoglobin A1c - Insulin, fasting  5. Vitamin D deficiency  - Vit D  25 hydroxy   6. Medication management  - CBC with Differential/Platelet - BASIC METABOLIC PANEL WITH GFR - Hepatic function panel - Magnesium   Recommended regular exercise, BP monitoring, weight control, and discussed med and SE's. Recommended labs to assess and monitor clinical status. Further disposition pending results of labs.

## 2014-07-15 NOTE — Patient Instructions (Signed)

## 2014-07-16 LAB — HEPATIC FUNCTION PANEL
ALK PHOS: 52 U/L (ref 39–117)
ALT: 42 U/L (ref 0–53)
AST: 40 U/L — ABNORMAL HIGH (ref 0–37)
Albumin: 4.7 g/dL (ref 3.5–5.2)
BILIRUBIN TOTAL: 0.4 mg/dL (ref 0.2–1.2)
Bilirubin, Direct: 0.1 mg/dL (ref 0.0–0.3)
Indirect Bilirubin: 0.3 mg/dL (ref 0.2–1.2)
Total Protein: 7.5 g/dL (ref 6.0–8.3)

## 2014-07-16 LAB — BASIC METABOLIC PANEL WITH GFR
BUN: 13 mg/dL (ref 6–23)
CALCIUM: 9.7 mg/dL (ref 8.4–10.5)
CO2: 27 meq/L (ref 19–32)
CREATININE: 1.23 mg/dL (ref 0.50–1.35)
Chloride: 100 mEq/L (ref 96–112)
GFR, EST NON AFRICAN AMERICAN: 70 mL/min
GFR, Est African American: 81 mL/min
GLUCOSE: 83 mg/dL (ref 70–99)
POTASSIUM: 3.5 meq/L (ref 3.5–5.3)
Sodium: 139 mEq/L (ref 135–145)

## 2014-07-16 LAB — LIPID PANEL
CHOL/HDL RATIO: 4.8 ratio
CHOLESTEROL: 173 mg/dL (ref 0–200)
HDL: 36 mg/dL — ABNORMAL LOW (ref >=40)
LDL Cholesterol: 96 mg/dL (ref 0–99)
Triglycerides: 205 mg/dL — ABNORMAL HIGH (ref <150)
VLDL: 41 mg/dL — ABNORMAL HIGH (ref 0–40)

## 2014-07-16 LAB — TSH: TSH: 2.613 u[IU]/mL (ref 0.350–4.500)

## 2014-07-16 LAB — MAGNESIUM: Magnesium: 2 mg/dL (ref 1.5–2.5)

## 2014-07-16 LAB — VITAMIN D 25 HYDROXY (VIT D DEFICIENCY, FRACTURES): Vit D, 25-Hydroxy: 86 ng/mL (ref 30–100)

## 2014-07-16 LAB — INSULIN, FASTING: Insulin fasting, serum: 101 u[IU]/mL — ABNORMAL HIGH (ref 2.0–19.6)

## 2014-07-19 ENCOUNTER — Ambulatory Visit: Payer: Self-pay | Admitting: Orthopedic Surgery

## 2014-07-24 ENCOUNTER — Ambulatory Visit (HOSPITAL_COMMUNITY)
Admission: RE | Admit: 2014-07-24 | Discharge: 2014-07-24 | Disposition: A | Discharge status: Home / Self Care | Payer: 59 | Source: Ambulatory Visit | Attending: Orthopedic Surgery | Admitting: Orthopedic Surgery

## 2014-07-24 ENCOUNTER — Encounter (HOSPITAL_COMMUNITY)
Admission: RE | Admit: 2014-07-24 | Discharge: 2014-07-24 | Disposition: A | Discharge status: Home / Self Care | Payer: 59 | Source: Ambulatory Visit | Attending: Specialist | Admitting: Specialist

## 2014-07-24 ENCOUNTER — Encounter (HOSPITAL_COMMUNITY): Payer: Self-pay

## 2014-07-24 DIAGNOSIS — M5136 Other intervertebral disc degeneration, lumbar region: Secondary | ICD-10-CM | POA: Diagnosis not present

## 2014-07-24 DIAGNOSIS — M5126 Other intervertebral disc displacement, lumbar region: Secondary | ICD-10-CM

## 2014-07-24 HISTORY — DX: Sleep apnea, unspecified: G47.30

## 2014-07-24 HISTORY — DX: Congenital malformation of eye, unspecified: Q15.9

## 2014-07-24 HISTORY — DX: Post-traumatic stress disorder, unspecified: F43.10

## 2014-07-24 HISTORY — DX: Gastro-esophageal reflux disease without esophagitis: K21.9

## 2014-07-24 HISTORY — DX: Tinnitus, unspecified ear: H93.19

## 2014-07-24 HISTORY — DX: Unspecified hearing loss, right ear: H91.91

## 2014-07-24 LAB — SURGICAL PCR SCREEN
MRSA, PCR: NEGATIVE
STAPHYLOCOCCUS AUREUS: NEGATIVE

## 2014-07-24 NOTE — Patient Instructions (Signed)
[place]  07/24/2014   Your procedure is scheduled on:       Wednesday March 9. 2016   Report to [facility] Main Entrance and follow signs to  [place] arrive at [place].  Call this number if you have problems the morning of surgery [phone] or Presurgical Testing [phone].   Remember:  Do not eat food or drink liquids :After Midnight.     Take these medicines the morning of surgery with A SIP OF WATER: Cetirizine (Zyrtec) if needed;Nasonex nasal spray if needed                               You may not have any metal on your body including hair pins and piercings  Do not wear jewelry, colognes, lotions, powders, or deodorant.  Men may shave face and neck.               Do not bring valuables to the hospital. [place].  Contacts, dentures or bridgework may not be worn into surgery.  Leave suitcase in the car. After surgery it may be brought to your room.  For patients admitted to the hospital, checkout time is 11:00 AM the day of discharge.     Special Instructions: review fact sheets for MRSA information, Blood Transfusion fact sheet, Incentive Spirometry. ________________________________________________________________________  [facility] - Preparing for Surgery Before surgery, you can play an important role.  Because skin is not sterile, your skin needs to be as free of germs as possible.  You can reduce the number of germs on your skin by washing with CHG (chlorahexidine gluconate) soap before surgery.  CHG is an antiseptic cleaner which kills germs and bonds with the skin to continue killing germs even after washing. Please DO NOT use if you have an allergy to CHG or antibacterial soaps.  If your skin becomes reddened/irritated stop using the CHG and inform your nurse when you arrive at Short Stay. Do not shave (including legs and underarms) for at least 48 hours prior to the first CHG shower.  You may shave  your face/neck. Please follow these instructions carefully:  1.  Shower with CHG Soap the night before surgery and the  morning of Surgery.  2.  If you choose to wash your hair, wash your hair first as usual with your  normal  shampoo.  3.  After you shampoo, rinse your hair and body thoroughly to remove the  shampoo.                           4.  Use CHG as you would any other liquid soap.  You can apply chg directly  to the skin and wash                       Gently with a scrungie or clean washcloth.  5.  Apply the CHG Soap to your body ONLY FROM THE NECK DOWN.   Do not use on face/ open                           Wound or open sores. Avoid contact with eyes, ears mouth and genitals (private parts).  Wash face,  Genitals (private parts) with your normal soap.             6.  Wash thoroughly, paying special attention to the area where your surgery  will be performed.  7.  Thoroughly rinse your body with warm water from the neck down.  8.  DO NOT shower/wash with your normal soap after using and rinsing off  the CHG Soap.                9.  Pat yourself dry with a clean towel.            10.  Wear clean pajamas.            11.  Place clean sheets on your bed the night of your first shower and do not  sleep with pets. Day of Surgery : Do not apply any lotions/deodorants the morning of surgery.  Please wear clean clothes to the hospital/surgery center.  FAILURE TO FOLLOW THESE INSTRUCTIONS MAY RESULT IN THE CANCELLATION OF YOUR SURGERY PATIENT SIGNATURE_________________________________  NURSE SIGNATURE__________________________________  ________________________________________________________________________   Craig [name]  An incentive spirometer is a tool that can help keep your lungs clear and active. This tool measures how well you are filling your lungs with each breath. Taking long deep breaths may help reverse or decrease the chance of developing  breathing (pulmonary) problems (especially infection) following:  A long period of time when you are unable to move or be active. BEFORE THE PROCEDURE   If the spirometer includes an indicator to show your best effort, your nurse or respiratory therapist will set it to a desired goal.  If possible, sit up straight or lean slightly forward. Try not to slouch.  Hold the incentive spirometer in an upright position. INSTRUCTIONS FOR USE   Sit on the edge of your bed if possible, or sit up as far as you can in bed or on a chair.  Hold the incentive spirometer in an upright position.  Breathe out normally.  Place the mouthpiece in your mouth and seal your lips tightly around it.  Breathe in slowly and as deeply as possible, raising the piston or the ball toward the top of the column.  Hold your breath for 3-5 seconds or for as long as possible. Allow the piston or ball to fall to the bottom of the column.  Remove the mouthpiece from your mouth and breathe out normally.  Rest for a few seconds and repeat Steps 1 through 7 at least 10 times every 1-2 hours when you are awake. Take your time and take a few normal breaths between deep breaths.  The spirometer may include an indicator to show your best effort. Use the indicator as a goal to work toward during each repetition.  After each set of 10 deep breaths, practice coughing to be sure your lungs are clear. If you have an incision (the cut made at the time of surgery), support your incision when coughing by placing a pillow or rolled up towels firmly against it. Once you are able to get out of bed, walk around indoors and cough well. You may stop using the incentive spirometer when instructed by your caregiver.  RISKS AND COMPLICATIONS  Take your time so you do not get dizzy or light-headed.  If you are in pain, you may need to take or ask for pain medication before doing incentive spirometry. It is harder to take a deep breath if you  are having  pain. AFTER USE  Rest and breathe slowly and easily.  It can be helpful to keep track of a log of your progress. Your caregiver can provide you with a simple table to help with this. If you are using the spirometer at home, follow these instructions: [place] IF:   You are having difficultly using the spirometer.  You have trouble using the spirometer as often as instructed.  Your pain medication is not giving enough relief while using the spirometer.  You develop fever of 100.5 F (38.1 C) or higher. SEEK IMMEDIATE MEDICAL CARE IF:   You cough up bloody sputum that had not been present before.  You develop fever of 102 F (38.9 C) or greater.  You develop worsening pain at or near the incision site. MAKE SURE YOU:   Understand these instructions.  Will watch your condition.  Will get help right away if you are not doing well or get worse. Document Released: 09/20/2006 Document Revised: 08/02/2011 Document Reviewed: 11/21/2006 Exit[employer] Patient Information 2014 [employer], [place].   ________________________________________________________________________  WHAT IS A BLOOD TRANSFUSION? Blood Transfusion Information  A transfusion is the replacement of blood or some of its parts. Blood is made up of multiple cells which provide different functions.  Red blood cells carry oxygen and are used for blood loss replacement.  White blood cells fight against infection.  Platelets control bleeding.  Plasma helps clot blood.  Other blood products are available for specialized needs, such as hemophilia or other clotting disorders. BEFORE THE TRANSFUSION  Who gives blood for transfusions?   Healthy volunteers who are fully evaluated to make sure their blood is safe. This is blood bank blood. Transfusion therapy is the safest it has ever been in the practice of medicine. Before blood is taken from a donor, a complete history is taken to make sure that person has  no history of diseases nor engages in risky social behavior (examples are intravenous drug use or sexual activity with multiple partners). The donor's travel history is screened to minimize risk of transmitting infections, such as malaria. The donated blood is tested for signs of infectious diseases, such as HIV and hepatitis. The blood is then tested to be sure it is compatible with you in order to minimize the chance of a transfusion reaction. If you or a relative donates blood, this is often done in anticipation of surgery and is not appropriate for emergency situations. It takes many days to process the donated blood. RISKS AND COMPLICATIONS Although transfusion therapy is very safe and saves many lives, the main dangers of transfusion include:   Getting an infectious disease.  Developing a transfusion reaction. This is an allergic reaction to something in the blood you were given. Every precaution is taken to prevent this. The decision to have a blood transfusion has been considered carefully by your caregiver before blood is given. Blood is not given unless the benefits outweigh the risks. AFTER THE TRANSFUSION  Right after receiving a blood transfusion, you will usually feel much better and more energetic. This is especially true if your red blood cells have gotten low (anemic). The transfusion raises the level of the red blood cells which carry oxygen, and this usually causes an energy increase.  The nurse administering the transfusion will monitor you carefully for complications. HOME CARE INSTRUCTIONS  No special instructions are needed after a transfusion. You may find your energy is better. Speak with your caregiver about any limitations on activity for underlying diseases  you may have. SEEK MEDICAL CARE IF:   Your condition is not improving after your transfusion.  You develop redness or irritation at the intravenous (IV) site. SEEK IMMEDIATE MEDICAL CARE IF:  Any of the following  symptoms occur over the next 12 hours:  Shaking chills.  You have a temperature by mouth above 102 F (38.9 C), not controlled by medicine.  Chest, back, or muscle pain.  People around you feel you are not acting correctly or are confused.  Shortness of breath or difficulty breathing.  Dizziness and fainting.  You get a rash or develop hives.  You have a decrease in urine output.  Your urine turns a dark color or changes to pink, red, or brown. Any of the following symptoms occur over the next 10 days:  You have a temperature by mouth above 102 F (38.9 C), not controlled by medicine.  Shortness of breath.  Weakness after normal activity.  The white part of the eye turns yellow (jaundice).  You have a decrease in the amount of urine or are urinating less often.  Your urine turns a dark color or changes to pink, red, or brown. Document Released: 05/07/2000 Document Revised: 08/02/2011 Document Reviewed: 12/25/2007 [facility] Patient Information 2014 [place], [place].  _______________________________________________________________________

## 2014-07-24 NOTE — Progress Notes (Signed)
CBCD,BMET,liver enzymes,Magnesium,Lipid panel,TSH,A1C,insulin fasting, Vitamin D lab results per epic 07/15/2014  Sleep study epic 03/13/2013 pt states does not have CPAP machine  EKG per epic 12/18/2013

## 2014-07-30 NOTE — Anesthesia Preprocedure Evaluation (Addendum)
Anesthesia Evaluation  Patient identified by MRN, date of birth, ID band Patient awake    Reviewed: Allergy & Precautions, H&P , NPO status , Patient's Chart, lab work & pertinent test results  Airway Mallampati: III  TM Distance: >3 FB Neck ROM: full    Dental no notable dental hx. (+) Teeth Intact, Dental Advisory Given   Pulmonary sleep apnea ,  breath sounds clear to auscultation  Pulmonary exam normal       Cardiovascular Exercise Tolerance: Good hypertension, Pt. on medications Rhythm:regular Rate:Normal     Neuro/Psych vertigo negative neurological ROS  negative psych ROS   GI/Hepatic negative GI ROS, Neg liver ROS, GERD-  Medicated and Controlled,  Endo/Other  prediabetes  Renal/GU negative Renal ROS  negative genitourinary   Musculoskeletal   Abdominal   Peds  Hematology negative hematology ROS (+)   Anesthesia Other Findings   Reproductive/Obstetrics negative OB ROS                           Anesthesia Physical Anesthesia Plan  ASA: III  Anesthesia Plan: General   Post-op Pain Management:    Induction: Intravenous  Airway Management Planned: Oral ETT  Additional Equipment:   Intra-op Plan:   Post-operative Plan: Extubation in OR  Informed Consent: I have reviewed the patients History and Physical, chart, labs and discussed the procedure including the risks, benefits and alternatives for the proposed anesthesia with the patient or authorized representative who has indicated his/her understanding and acceptance.   Dental Advisory Given  Plan Discussed with: CRNA and Surgeon  Anesthesia Plan Comments:         Anesthesia Quick Evaluation

## 2014-07-31 ENCOUNTER — Ambulatory Visit (HOSPITAL_COMMUNITY)
Admission: RE | Admit: 2014-07-31 | Discharge: 2014-08-02 | Disposition: A | Discharge status: Home / Self Care | Payer: 59 | Source: Ambulatory Visit | Attending: Specialist | Admitting: Specialist

## 2014-07-31 ENCOUNTER — Ambulatory Visit (HOSPITAL_COMMUNITY): Payer: 59 | Admitting: Anesthesiology

## 2014-07-31 ENCOUNTER — Encounter (HOSPITAL_COMMUNITY)
Admission: RE | Disposition: A | Discharge status: Home / Self Care | Payer: Self-pay | Source: Ambulatory Visit | Attending: Specialist

## 2014-07-31 ENCOUNTER — Ambulatory Visit (HOSPITAL_COMMUNITY): Payer: 59

## 2014-07-31 ENCOUNTER — Encounter (HOSPITAL_COMMUNITY): Payer: Self-pay | Admitting: *Deleted

## 2014-07-31 DIAGNOSIS — Z79899 Other long term (current) drug therapy: Secondary | ICD-10-CM | POA: Diagnosis not present

## 2014-07-31 DIAGNOSIS — M5127 Other intervertebral disc displacement, lumbosacral region: Secondary | ICD-10-CM | POA: Insufficient documentation

## 2014-07-31 DIAGNOSIS — R42 Dizziness and giddiness: Secondary | ICD-10-CM | POA: Insufficient documentation

## 2014-07-31 DIAGNOSIS — M48061 Spinal stenosis, lumbar region without neurogenic claudication: Secondary | ICD-10-CM | POA: Diagnosis present

## 2014-07-31 DIAGNOSIS — E785 Hyperlipidemia, unspecified: Secondary | ICD-10-CM | POA: Insufficient documentation

## 2014-07-31 DIAGNOSIS — M4807 Spinal stenosis, lumbosacral region: Secondary | ICD-10-CM | POA: Diagnosis not present

## 2014-07-31 DIAGNOSIS — K219 Gastro-esophageal reflux disease without esophagitis: Secondary | ICD-10-CM | POA: Diagnosis not present

## 2014-07-31 DIAGNOSIS — I10 Essential (primary) hypertension: Secondary | ICD-10-CM | POA: Diagnosis not present

## 2014-07-31 DIAGNOSIS — Z419 Encounter for procedure for purposes other than remedying health state, unspecified: Secondary | ICD-10-CM

## 2014-07-31 DIAGNOSIS — E291 Testicular hypofunction: Secondary | ICD-10-CM | POA: Insufficient documentation

## 2014-07-31 DIAGNOSIS — G4730 Sleep apnea, unspecified: Secondary | ICD-10-CM | POA: Insufficient documentation

## 2014-07-31 DIAGNOSIS — F4310 Post-traumatic stress disorder, unspecified: Secondary | ICD-10-CM | POA: Insufficient documentation

## 2014-07-31 HISTORY — PX: LUMBAR LAMINECTOMY/DECOMPRESSION MICRODISCECTOMY: SHX5026

## 2014-07-31 HISTORY — DX: Spinal stenosis, lumbar region without neurogenic claudication: M48.061

## 2014-07-31 LAB — GLUCOSE, CAPILLARY: Glucose-Capillary: 96 mg/dL (ref 70–99)

## 2014-07-31 SURGERY — LUMBAR LAMINECTOMY/DECOMPRESSION MICRODISCECTOMY 1 LEVEL
Anesthesia: General | Laterality: Left

## 2014-07-31 MED ORDER — NEOSTIGMINE METHYLSULFATE 10 MG/10ML IV SOLN
INTRAVENOUS | Status: DC | PRN
  Administered 2014-07-31: 5 mg via INTRAVENOUS

## 2014-07-31 MED ORDER — TIZANIDINE HCL 4 MG PO TABS
4.0000 mg | ORAL_TABLET | ORAL | Status: AC | PRN
  Administered 2014-07-31 – 2014-08-01 (×3): 4 mg via ORAL
  Filled 2014-07-31 (×6): qty 1

## 2014-07-31 MED ORDER — HYDROCODONE-ACETAMINOPHEN 5-325 MG PO TABS
1.0000 | ORAL_TABLET | ORAL | Status: DC | PRN
  Administered 2014-08-01: 2 via ORAL
  Filled 2014-07-31 (×2): qty 2

## 2014-07-31 MED ORDER — FLEET ENEMA 7-19 GM/118ML RE ENEM: 1.0000 | ENEMA | Freq: Once | RECTAL | Status: AC | PRN

## 2014-07-31 MED ORDER — MIDAZOLAM HCL 2 MG/2ML IJ SOLN
INTRAMUSCULAR | Status: AC
  Filled 2014-07-31: qty 2

## 2014-07-31 MED ORDER — SODIUM CHLORIDE 0.9 % IR SOLN
Status: AC
  Filled 2014-07-31: qty 1

## 2014-07-31 MED ORDER — KCL IN DEXTROSE-NACL 20-5-0.45 MEQ/L-%-% IV SOLN
INTRAVENOUS | Status: AC
  Administered 2014-07-31: 14:00:00 via INTRAVENOUS
  Filled 2014-07-31 (×2): qty 1000

## 2014-07-31 MED ORDER — MENTHOL 3 MG MT LOZG: 1.0000 | LOZENGE | OROMUCOSAL | Status: DC | PRN

## 2014-07-31 MED ORDER — ONDANSETRON HCL 4 MG/2ML IJ SOLN: 4.0000 mg | INTRAMUSCULAR | Status: DC | PRN

## 2014-07-31 MED ORDER — EPHEDRINE SULFATE 50 MG/ML IJ SOLN
INTRAMUSCULAR | Status: AC
  Filled 2014-07-31: qty 1

## 2014-07-31 MED ORDER — ACETAMINOPHEN 10 MG/ML IV SOLN
1000.0000 mg | Freq: Once | INTRAVENOUS | Status: AC
  Administered 2014-07-31: 1000 mg via INTRAVENOUS
  Filled 2014-07-31: qty 100

## 2014-07-31 MED ORDER — HYDROCHLOROTHIAZIDE 12.5 MG PO CAPS
12.5000 mg | ORAL_CAPSULE | Freq: Every day | ORAL | Status: DC
  Administered 2014-08-01: 12.5 mg via ORAL
  Filled 2014-07-31 (×2): qty 1

## 2014-07-31 MED ORDER — LISINOPRIL 20 MG PO TABS
20.0000 mg | ORAL_TABLET | Freq: Every day | ORAL | Status: DC
  Administered 2014-08-01: 20 mg via ORAL
  Filled 2014-07-31 (×2): qty 1

## 2014-07-31 MED ORDER — SODIUM CHLORIDE 0.9 % IV SOLN: 250.0000 mL | INTRAVENOUS | Status: DC

## 2014-07-31 MED ORDER — ROCURONIUM BROMIDE 100 MG/10ML IV SOLN
INTRAVENOUS | Status: AC
  Filled 2014-07-31: qty 1

## 2014-07-31 MED ORDER — DEXTROSE 5 % IV SOLN
3.0000 g | Freq: Three times a day (TID) | INTRAVENOUS | Status: DC
  Filled 2014-07-31: qty 3000

## 2014-07-31 MED ORDER — CEFAZOLIN SODIUM-DEXTROSE 2-3 GM-% IV SOLR
2.0000 g | INTRAVENOUS | Status: AC
  Administered 2014-07-31: 2 g via INTRAVENOUS

## 2014-07-31 MED ORDER — DOCUSATE SODIUM 100 MG PO CAPS
100.0000 mg | ORAL_CAPSULE | Freq: Two times a day (BID) | ORAL | Status: DC
  Administered 2014-07-31 – 2014-08-01 (×3): 100 mg via ORAL

## 2014-07-31 MED ORDER — TIZANIDINE HCL 4 MG PO CAPS: 4.0000 mg | ORAL_CAPSULE | Freq: Three times a day (TID) | ORAL | Status: DC | PRN

## 2014-07-31 MED ORDER — ONDANSETRON HCL 4 MG/2ML IJ SOLN
INTRAMUSCULAR | Status: DC | PRN
  Administered 2014-07-31: 4 mg via INTRAVENOUS

## 2014-07-31 MED ORDER — BISACODYL 5 MG PO TBEC: 5.0000 mg | DELAYED_RELEASE_TABLET | Freq: Every day | ORAL | Status: DC | PRN

## 2014-07-31 MED ORDER — LACTATED RINGERS IV SOLN: INTRAVENOUS | Status: DC

## 2014-07-31 MED ORDER — ALUM & MAG HYDROXIDE-SIMETH 200-200-20 MG/5ML PO SUSP: 30.0000 mL | Freq: Four times a day (QID) | ORAL | Status: DC | PRN

## 2014-07-31 MED ORDER — SUCCINYLCHOLINE CHLORIDE 20 MG/ML IJ SOLN
INTRAMUSCULAR | Status: DC | PRN
  Administered 2014-07-31: 100 mg via INTRAVENOUS

## 2014-07-31 MED ORDER — ROCURONIUM BROMIDE 100 MG/10ML IV SOLN
INTRAVENOUS | Status: DC | PRN
  Administered 2014-07-31: 30 mg via INTRAVENOUS
  Administered 2014-07-31: 10 mg via INTRAVENOUS
  Administered 2014-07-31: 5 mg via INTRAVENOUS

## 2014-07-31 MED ORDER — FENTANYL CITRATE 0.05 MG/ML IJ SOLN
INTRAMUSCULAR | Status: DC | PRN
  Administered 2014-07-31 (×2): 50 ug via INTRAVENOUS

## 2014-07-31 MED ORDER — ACETAMINOPHEN 650 MG RE SUPP: 650.0000 mg | RECTAL | Status: DC | PRN

## 2014-07-31 MED ORDER — RISAQUAD PO CAPS
1.0000 | ORAL_CAPSULE | Freq: Every day | ORAL | Status: DC
  Administered 2014-07-31 – 2014-08-01 (×2): 1 via ORAL
  Filled 2014-07-31 (×3): qty 1

## 2014-07-31 MED ORDER — DEXAMETHASONE SODIUM PHOSPHATE 10 MG/ML IJ SOLN
INTRAMUSCULAR | Status: AC
  Filled 2014-07-31: qty 1

## 2014-07-31 MED ORDER — NEOSTIGMINE METHYLSULFATE 10 MG/10ML IV SOLN
INTRAVENOUS | Status: AC
  Filled 2014-07-31: qty 1

## 2014-07-31 MED ORDER — MECLIZINE HCL 12.5 MG PO TABS: 12.5000 mg | ORAL_TABLET | Freq: Every day | ORAL | Status: DC | PRN

## 2014-07-31 MED ORDER — LORATADINE 10 MG PO TABS
10.0000 mg | ORAL_TABLET | Freq: Every day | ORAL | Status: DC
  Administered 2014-07-31 – 2014-08-01 (×2): 10 mg via ORAL
  Filled 2014-07-31 (×3): qty 1

## 2014-07-31 MED ORDER — CEFAZOLIN SODIUM-DEXTROSE 2-3 GM-% IV SOLR
2.0000 g | Freq: Three times a day (TID) | INTRAVENOUS | Status: AC
  Administered 2014-07-31 – 2014-08-01 (×3): 2 g via INTRAVENOUS
  Filled 2014-07-31 (×3): qty 50

## 2014-07-31 MED ORDER — SODIUM CHLORIDE 0.9 % IJ SOLN
3.0000 mL | Freq: Two times a day (BID) | INTRAMUSCULAR | Status: DC
  Administered 2014-08-01: 3 mL via INTRAVENOUS

## 2014-07-31 MED ORDER — HYDROMORPHONE HCL 1 MG/ML IJ SOLN
0.5000 mg | INTRAMUSCULAR | Status: DC | PRN
  Administered 2014-07-31: 1 mg via INTRAVENOUS
  Filled 2014-07-31: qty 1

## 2014-07-31 MED ORDER — CEFAZOLIN SODIUM 1-5 GM-% IV SOLN
1.0000 g | Freq: Once | INTRAVENOUS | Status: AC
  Administered 2014-07-31: 1 g via INTRAVENOUS
  Filled 2014-07-31: qty 50

## 2014-07-31 MED ORDER — HYDROMORPHONE HCL 1 MG/ML IJ SOLN
0.2500 mg | INTRAMUSCULAR | Status: DC | PRN
  Administered 2014-07-31: 0.25 mg via INTRAVENOUS

## 2014-07-31 MED ORDER — OXYCODONE-ACETAMINOPHEN 5-325 MG PO TABS
1.0000 | ORAL_TABLET | ORAL | Status: DC | PRN
  Administered 2014-07-31 – 2014-08-02 (×6): 2 via ORAL
  Filled 2014-07-31 (×7): qty 2

## 2014-07-31 MED ORDER — SENNOSIDES-DOCUSATE SODIUM 8.6-50 MG PO TABS: 1.0000 | ORAL_TABLET | Freq: Every evening | ORAL | Status: DC | PRN

## 2014-07-31 MED ORDER — DEXAMETHASONE SODIUM PHOSPHATE 10 MG/ML IJ SOLN
INTRAMUSCULAR | Status: DC | PRN
  Administered 2014-07-31: 10 mg via INTRAVENOUS

## 2014-07-31 MED ORDER — LISINOPRIL-HYDROCHLOROTHIAZIDE 20-12.5 MG PO TABS: 1.0000 | ORAL_TABLET | Freq: Every day | ORAL | Status: DC

## 2014-07-31 MED ORDER — PHENOL 1.4 % MT LIQD: 1.0000 | OROMUCOSAL | Status: DC | PRN

## 2014-07-31 MED ORDER — POLYMYXIN B SULFATE 500000 UNITS IJ SOLR
INTRAMUSCULAR | Status: DC | PRN
  Administered 2014-07-31: 500 mL

## 2014-07-31 MED ORDER — THROMBIN 5000 UNITS EX SOLR
OROMUCOSAL | Status: DC | PRN
  Administered 2014-07-31: 10 mL via TOPICAL

## 2014-07-31 MED ORDER — FLUTICASONE PROPIONATE 50 MCG/ACT NA SUSP
1.0000 | Freq: Every day | NASAL | Status: AC
  Administered 2014-07-31: 1 via NASAL
  Filled 2014-07-31: qty 16

## 2014-07-31 MED ORDER — LIDOCAINE HCL (CARDIAC) 20 MG/ML IV SOLN
INTRAVENOUS | Status: DC | PRN
  Administered 2014-07-31: 100 mg via INTRAVENOUS

## 2014-07-31 MED ORDER — LIDOCAINE HCL (CARDIAC) 20 MG/ML IV SOLN
INTRAVENOUS | Status: AC
  Filled 2014-07-31: qty 5

## 2014-07-31 MED ORDER — PROPOFOL 10 MG/ML IV BOLUS
INTRAVENOUS | Status: AC
  Filled 2014-07-31: qty 20

## 2014-07-31 MED ORDER — FENTANYL CITRATE 0.05 MG/ML IJ SOLN
INTRAMUSCULAR | Status: AC
  Filled 2014-07-31: qty 5

## 2014-07-31 MED ORDER — BUPIVACAINE-EPINEPHRINE 0.5% -1:200000 IJ SOLN
INTRAMUSCULAR | Status: DC | PRN
  Administered 2014-07-31: 15 mL

## 2014-07-31 MED ORDER — GLYCOPYRROLATE 0.2 MG/ML IJ SOLN
INTRAMUSCULAR | Status: DC | PRN
  Administered 2014-07-31: .8 mg via INTRAVENOUS

## 2014-07-31 MED ORDER — OXYCODONE-ACETAMINOPHEN 5-325 MG PO TABS: 1.0000 | ORAL_TABLET | ORAL | Status: DC | PRN

## 2014-07-31 MED ORDER — MIDAZOLAM HCL 5 MG/5ML IJ SOLN
INTRAMUSCULAR | Status: DC | PRN
  Administered 2014-07-31: 2 mg via INTRAVENOUS

## 2014-07-31 MED ORDER — ONDANSETRON HCL 4 MG/2ML IJ SOLN
INTRAMUSCULAR | Status: AC
  Filled 2014-07-31: qty 2

## 2014-07-31 MED ORDER — GLYCOPYRROLATE 0.2 MG/ML IJ SOLN
INTRAMUSCULAR | Status: AC
  Filled 2014-07-31: qty 4

## 2014-07-31 MED ORDER — MAGNESIUM OXIDE 400 (241.3 MG) MG PO TABS
400.0000 mg | ORAL_TABLET | Freq: Every day | ORAL | Status: DC
  Administered 2014-07-31 – 2014-08-01 (×2): 400 mg via ORAL
  Filled 2014-07-31 (×3): qty 1

## 2014-07-31 MED ORDER — BUPIVACAINE-EPINEPHRINE (PF) 0.5% -1:200000 IJ SOLN
INTRAMUSCULAR | Status: AC
  Filled 2014-07-31: qty 30

## 2014-07-31 MED ORDER — HYDROMORPHONE HCL 1 MG/ML IJ SOLN
INTRAMUSCULAR | Status: AC
  Filled 2014-07-31: qty 1

## 2014-07-31 MED ORDER — PANTOPRAZOLE SODIUM 40 MG PO TBEC
40.0000 mg | DELAYED_RELEASE_TABLET | Freq: Every day | ORAL | Status: DC
  Administered 2014-07-31 – 2014-08-01 (×2): 40 mg via ORAL
  Filled 2014-07-31 (×3): qty 1

## 2014-07-31 MED ORDER — PROPOFOL 10 MG/ML IV BOLUS
INTRAVENOUS | Status: DC | PRN
  Administered 2014-07-31: 250 mg via INTRAVENOUS

## 2014-07-31 MED ORDER — THROMBIN 5000 UNITS EX SOLR
CUTANEOUS | Status: AC
  Filled 2014-07-31: qty 10000

## 2014-07-31 MED ORDER — ACETAMINOPHEN 325 MG PO TABS
650.0000 mg | ORAL_TABLET | ORAL | Status: DC | PRN
  Administered 2014-07-31: 650 mg via ORAL
  Filled 2014-07-31: qty 2

## 2014-07-31 MED ORDER — LACTATED RINGERS IV SOLN
INTRAVENOUS | Status: DC
  Administered 2014-07-31: 08:00:00 via INTRAVENOUS

## 2014-07-31 MED ORDER — DOCUSATE SODIUM 100 MG PO CAPS: 100.0000 mg | ORAL_CAPSULE | Freq: Two times a day (BID) | ORAL | Status: DC | PRN

## 2014-07-31 MED ORDER — VITAMIN D-3 125 MCG (5000 UT) PO TABS: 10000.0000 [IU] | ORAL_TABLET | ORAL | Status: DC

## 2014-07-31 MED ORDER — SODIUM CHLORIDE 0.9 % IJ SOLN: 3.0000 mL | INTRAMUSCULAR | Status: DC | PRN

## 2014-07-31 MED ORDER — EPHEDRINE SULFATE 50 MG/ML IJ SOLN
INTRAMUSCULAR | Status: DC | PRN
  Administered 2014-07-31: 5 mg via INTRAVENOUS

## 2014-07-31 MED ORDER — CEFAZOLIN SODIUM-DEXTROSE 2-3 GM-% IV SOLR
INTRAVENOUS | Status: AC
  Filled 2014-07-31: qty 50

## 2014-07-31 MED ORDER — ATROPINE SULFATE 0.4 MG/ML IJ SOLN
INTRAMUSCULAR | Status: AC
  Filled 2014-07-31: qty 2

## 2014-07-31 MED ORDER — SODIUM CHLORIDE 0.9 % IJ SOLN
INTRAMUSCULAR | Status: AC
  Filled 2014-07-31: qty 10

## 2014-07-31 SURGICAL SUPPLY — 46 items
BAG SPEC THK2 15X12 ZIP CLS (MISCELLANEOUS)
BAG ZIPLOCK 12X15 (MISCELLANEOUS) IMPLANT
CLEANER TIP ELECTROSURG 2X2 (MISCELLANEOUS) ×2 IMPLANT
CLOTH 2% CHLOROHEXIDINE 3PK (PERSONAL CARE ITEMS) ×2 IMPLANT
DRAPE MICROSCOPE LEICA (MISCELLANEOUS) ×2 IMPLANT
DRAPE POUCH INSTRU U-SHP 10X18 (DRAPES) ×2 IMPLANT
DRAPE SURG 17X11 SM STRL (DRAPES) ×2 IMPLANT
DRAPE UTILITY XL STRL (DRAPES) ×2 IMPLANT
DRSG AQUACEL AG ADV 3.5X 4 (GAUZE/BANDAGES/DRESSINGS) ×1 IMPLANT
DRSG AQUACEL AG ADV 3.5X 6 (GAUZE/BANDAGES/DRESSINGS) IMPLANT
DURAPREP 26ML APPLICATOR (WOUND CARE) ×2 IMPLANT
DURASEAL SPINE SEALANT 3ML (MISCELLANEOUS) IMPLANT
ELECT BLADE TIP CTD 4 INCH (ELECTRODE) IMPLANT
ELECT REM PT RETURN 9FT ADLT (ELECTROSURGICAL) ×2
ELECTRODE REM PT RTRN 9FT ADLT (ELECTROSURGICAL) ×1 IMPLANT
GLOVE BIOGEL PI IND STRL 7.5 (GLOVE) ×1 IMPLANT
GLOVE BIOGEL PI INDICATOR 7.5 (GLOVE) ×1
GLOVE SURG SS PI 7.5 STRL IVOR (GLOVE) ×2 IMPLANT
GLOVE SURG SS PI 8.0 STRL IVOR (GLOVE) ×4 IMPLANT
GOWN STRL REUS W/TWL XL LVL3 (GOWN DISPOSABLE) ×5 IMPLANT
IV CATH 14GX2 1/4 (CATHETERS) ×1 IMPLANT
KIT BASIN OR (CUSTOM PROCEDURE TRAY) ×2 IMPLANT
KIT POSITIONING SURG ANDREWS (MISCELLANEOUS) ×2 IMPLANT
MANIFOLD NEPTUNE II (INSTRUMENTS) ×1 IMPLANT
NDL SPNL 18GX3.5 QUINCKE PK (NEEDLE) ×2 IMPLANT
NEEDLE SPNL 18GX3.5 QUINCKE PK (NEEDLE) ×4 IMPLANT
PACK LAMINECTOMY ORTHO (CUSTOM PROCEDURE TRAY) ×2 IMPLANT
PATTIES SURGICAL .5 X.5 (GAUZE/BANDAGES/DRESSINGS) IMPLANT
PATTIES SURGICAL .75X.75 (GAUZE/BANDAGES/DRESSINGS) ×1 IMPLANT
PATTIES SURGICAL 1X1 (DISPOSABLE) ×1 IMPLANT
RUBBERBAND STERILE (MISCELLANEOUS) ×2 IMPLANT
SPONGE SURGIFOAM ABS GEL 100 (HEMOSTASIS) ×2 IMPLANT
STAPLER VISISTAT (STAPLE) ×1 IMPLANT
STRIP CLOSURE SKIN 1/2X4 (GAUZE/BANDAGES/DRESSINGS) ×2 IMPLANT
SUT NURALON 4 0 TR CR/8 (SUTURE) IMPLANT
SUT PROLENE 3 0 PS 2 (SUTURE) ×2 IMPLANT
SUT VIC AB 1 CT1 27 (SUTURE) ×2
SUT VIC AB 1 CT1 27XBRD ANTBC (SUTURE) IMPLANT
SUT VIC AB 1-0 CT2 27 (SUTURE) ×1 IMPLANT
SUT VIC AB 2-0 CT1 27 (SUTURE) ×2
SUT VIC AB 2-0 CT1 TAPERPNT 27 (SUTURE) IMPLANT
SUT VIC AB 2-0 CT2 27 (SUTURE) ×2 IMPLANT
SYR 3ML LL SCALE MARK (SYRINGE) IMPLANT
TOWEL OR 17X26 10 PK STRL BLUE (TOWEL DISPOSABLE) ×2 IMPLANT
TOWEL OR NON WOVEN STRL DISP B (DISPOSABLE) IMPLANT
YANKAUER SUCT BULB TIP NO VENT (SUCTIONS) ×1 IMPLANT

## 2014-07-31 NOTE — Discharge Instructions (Signed)
Walk As Tolerated utilizing back precautions.  No bending, twisting, or lifting.  No driving for 2 weeks.   °Aquacel dressing may remain in place until follow up. May shower with aquacel dressing in place. If the dressing peels off or becomes saturated, you may remove aquacel dressing and place gauze and tape dressing which should be kept clean and dry and changed daily. Do not remove steri-strips if they are present. °See Dr. Ethylene Reznick in office in 10 to 14 days. Begin taking aspirin 81mg per day starting 4 days after your surgery if not allergic to aspirin or on another blood thinner. °Walk daily even outside. Use a cane or walker only if necessary. °Avoid sitting on soft sofas. ° °

## 2014-07-31 NOTE — Interval H&P Note (Signed)
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 After consideration of risks, benefits and other options for treatment, the patient has consented to  Procedure(s): LUMBAR LAMINECTOMY/DECOMPRESSION MICRODISCECTOMY 1 LEVEL L5-S1 ON LEFT (Left) as a surgical intervention .  The patient's history has been reviewed, patient examined, no change in status, stable for surgery.  I have reviewed the patient's chart and labs.  Questions were answered to the patient's satisfaction.     [name]

## 2014-07-31 NOTE — Anesthesia Postprocedure Evaluation (Signed)
  Anesthesia Post-op Note  Patient: Craig [name]  Procedure(s) Performed: Procedure(s) (LRB): LUMBAR LAMINECTOMY/DECOMPRESSION MICRODISCECTOMY 1 LEVEL L5-S1 ON LEFT (Left)  Patient Location: PACU  Anesthesia Type: General  Level of Consciousness: awake and alert   Airway and Oxygen Therapy: Patient Spontanous Breathing  Post-op Pain: mild  Post-op Assessment: Post-op Vital signs reviewed, Patient's Cardiovascular Status Stable, Respiratory Function Stable, Patent Airway and No signs of Nausea or vomiting  Last Vitals:  Filed Vitals:   07/31/14 1145  BP: 121/74  Pulse: 70  Temp:   Resp: 17    Post-op Vital Signs: stable   Complications: No apparent anesthesia complications

## 2014-07-31 NOTE — Anesthesia Procedure Notes (Signed)
Procedure Name: Intubation Date/Time: 07/31/2014 8:43 AM Performed by: Carleene Cooper A Pre-anesthesia Checklist: Patient identified, Timeout performed, Emergency Drugs available, Suction available and Patient being monitored Patient Re-evaluated:Patient Re-evaluated prior to inductionOxygen Delivery Method: Circle system utilized Preoxygenation: Pre-oxygenation with 100% oxygen Intubation Type: IV induction Ventilation: Mask ventilation without difficulty Laryngoscope Size: Mac and 4 Grade View: Grade I Tube type: Oral Tube size: 7.5 mm Number of attempts: 1 Airway Equipment and Method: Stylet Placement Confirmation: ETT inserted through vocal cords under direct vision,  breath sounds checked- equal and bilateral and positive ETCO2 Secured at: 22 cm Tube secured with: Tape Dental Injury: Teeth and Oropharynx as per pre-operative assessment

## 2014-07-31 NOTE — H&P (View-Only) (Signed)
Craig [name] DOB: [name] 14, 1967 Married / Language: English / Race: Black or African American Male  Chief Complaint back, left buttock, left leg pain  History of Present Illness The patient is a 49 year old male who presents today for follow up of their back. The patient is being followed for their low back symptoms. They are now 1 year(s) out from when symptoms began. Symptoms reported today include: pain. The patient states that they are doing poorly. Current treatment includes: home exercise program, activity modification and NSAIDs (Bayer Back & Body). The patient has reported improvement of their symptoms with: Cortisone injections. The patient indicates that they have questions or concerns today regarding surgery. Note for "Follow-up back": Craig [name] follows up for ongoing lower back tightness and L buttock/leg pain. He reports after his last ESI in September, he did get relief for about 3 months, but the pain returned while on vacation in [place] and he was limited in how much he could do. He has been dealing with the pain since then but at this point reports he is having pain daily and with most walking and standing and it is interfering with his life and ADLs. He is working as a [employer], will be retiring in 2 months, then likely working at [employer] where he will not need a duty belt. He currently spends a good deal of his day at his desk. Sitting, lying down, and forward flexion all alleviate his symptoms. His back pain is not severe, he describes it as more of a tightness. His more significant pain is what radiates into the left buttock, down the posterior thigh, into the lateral lower leg and lateral foot, typical S1 distribution. He rarely notes pain over the dorsal foot (L5 distribution). He denies weakness or numbness, denies groin pain, right sided symptoms. He is currently taking Bayer back and body, was previously taking tizanidine but he couldn't tolerate it. He  has had 2 ESI's L5-S1 left both with temporary relief, most recent with 3 months of relief prior to return of his symptoms. He reports he thinks his symptoms even started when he was in the [place]. 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 He reports he did file a claim in 2010 regarding his lower back but it was apparently denied as he did not have evidence of ongoing tx for his back.  Allergies No Known Drug Allergies06/12/2013  Family History No pertinent family history First Degree Relatives  Social History Tobacco use Never smoker. never smoker  Medication History Bayer Back & Body Pain Ex St (500-32.5MG  Tablet, Oral) Active. Lisinopril-Hydrochlorothiazide (20-12.5MG  Tablet, Oral) Active. Magnesium (Oral) Specific dose unknown - Active. Vitamin D (Oral) Specific dose unknown - Active. Cin Quin (Oral) Specific dose unknown - Active. Medications Reconciled  Review of Systems General Not Present- Fever and Weight Loss. Skin Not Present- Rash. Cardiovascular Not Present- Chest Pain and Shortness of Breath. Male Genitourinary Not Present- Painful Urination. Musculoskeletal Present- Joint Pain and Muscle Pain. Not Present- Joint Swelling. Neurological Not Present- Difficulty with balance, Loss of bladder control, Loss of bowel control, Numbness and Weakness. Endocrine Not Present- Excessive Thirst and Excessive Urination. Hematology Not Present- Easy Bruising.  Vitals  07/09/2014 3:16 PM Weight: 225 lb Height: 73in Body Surface Area: 2.26 m Body Mass Index: 29.68 kg/m  BP: 149/74 (  Sitting, Right Arm, Standard)  Physical Exam General Mental Status -Alert. General Appearance-pleasant, appears comfortable(seated), Not in acute distress. Orientation-Oriented X3. Build & Nutrition-Well nourished and Well  developed. Posture-Leaning forward. Gait-Unassisted and unimpaired.  Abdomen Palpation/Percussion Tenderness - Abdomen is non-tender to palpation. Rigidity (guarding) - Abdomen is soft.  Peripheral Vascular Lower Extremity Palpation - Homan's sign - Bilateral - Negative (normal). Posterior tibial pulse - Bilateral - 2+. Dorsalis pedis pulse - Bilateral - 2+.  Neurologic Motor Strength - Hip Flexion - Bilateral - 5/5. Quadriceps - Bilateral - 5/5. Hamstrings - Bilateral - 5/5. Ankle Dorsiflexion - Bilateral - 5/5. Ankle Plantarflexion - Bilateral - 5/5. Extensor Hallucis Longus - Bilateral - 5/5. Sensation Lower Extremity - Bilateral - sensation is intact to light touch in the lower extremity. Reflexes Patellar Reflex - Bilateral - 2+. Achilles Reflex - Bilateral - 2+. Babinski - Bilateral - Babinski not present. Clonus - Bilateral - clonus not present.  Musculoskeletal Spine/Ribs/Pelvis  Lumbosacral Spine: Inspection and Palpation - Tenderness - no tenderness to palpation of the lumbar spinous processes, no tenderness to palpation of the left flank, no tenderness to palpation of right flank, no tenderness to palpation of left lumbar paraspinals, no tenderness to palpation of right lumbar paraspinals, no tenderness to palpation of the left buttock, no tenderness to palpation of the right buttock, no tenderness to palpation of the left greater trochanter, no tenderness to palpation of the right greater trochanter. Swelling - none. Surrounding tissue tension/texture is - soft. Sensation - normal. Other characteristics - no ecchymosis, no abnormal warmth, no erythema, no evidence of cellulitis. ROM - Flexion - mildly decreased range of motion. Extension - moderately decreased range of motion and painful. Special Testing - Lumbar - Left seated straight leg raise negative, Right seated straight leg raise negative. Lower Extremity  Right Lower Extremity: Right Hip - ROM - Full ROM of the hip  and pain-free. Right Knee - ROM - Full ROM of the knee and pain-free. Right Ankle - ROM - Full and pain-free ROM of the ankle. Left Lower Extremity: Left Hip - ROM - Full ROM of the hip and pain-free. Left Knee - ROM - Full ROM of the knee and pain-free. Left Ankle - ROM - Full and pain-free ROM of the ankle.  Lymphatic General Lymphatics Description - No Localized lymphadenopathy.  Imaging xrays from 10/2013 reviewed. R THA prosthesis maintained. L hip mild degenerative changes. DDD L5-S1, moderately severe with loss of disc height anterior spurring noted consistent with early autofusion, foraminal narrowing noted. No listhesis or instability.  MRI Lspine images and report from 10/2013 reviewed by Dr. Tonita Cong. Severe DDD L5-S1 with Modic type 2 changes with posterior disc/osteophyte complex and superimposed large central caudal extrusion which mildly displaces the left S1 root. Severe B/L neural foraminal narrowing with encroachment on L5 dorsal root ganglia.  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 We discussed relevant anatomy in detail. He does have significant DDD at L5-S1 without instability and with autofusion on his xrays, which does cause significant foraminal stenosis bilaterally, which would cause L5 symptoms, but he does not appear to be symptomatic in the L5 distribution. He does however continue  to be symptomatic in the L S1 distribution which is caused by the disc/osteophyte complex and extrusion displacing the S1 root. We discussed tx options in detail. He has had 2 ESI's with temporary relief which also  confirms his pain generator. He is interested in surgical options at this point given short term relief with injections and inability to tolerate activity with modifications. Given the S1 distribution of his symptoms which did respond to ESI's L5-S1 left and given his MRI findings listed above, we discussed the possibility of decompression L5-S1 left to alleviate his buttock and leg pain. We discussed that this would not address his underlying DDD and foraminal stenosis, as the surgical procedure for that would require a fusion, but would not recommend concomitant fusion currently given his current exam and symptoms in the S1 distribution as opposed to L5 distribution and minimal back pain. He also appears to already be autofusing at L5-S1 on his prior xrays. We discussed possible need for a fusion in the future if he would develop L5 symptoms or severe ongoing back pain. He does not have any right sided pain to warrant a bilateral decompression.  At this point, patient would like to proceed with surgery, microlumbar decompression L5-S1 left, given the duration of symptoms, findings on exam and MRI, and temporary relief from ESI's, reasonable to proceed with a limited decompression at this level at this point. Discussed the procedure itself as well as risks, complications, and alternatives, including but not limited to DVT, PE, infx, bleeding, failure of procedure, need for secondary procedure, nerve injury, ongoing pain/symptoms, dural tear, CSF leak, and anesthesia risk, even stroke or death. Also discussed typical post-op course as well as spine precautions: no lifting, bending, twisting, prolonged sitting x 6 weeks post-op. Discussed walking program, PT, gentle stretching post-op. All questions were answered. Patient desires to proceed with surgery. He does not require medical clearance. He would like to try to get scheduled ASAP. Declined pain medication. Continue activity modifications in the interim. He  will follow up 2 weeks post-op for suture removal and call with any questions or concerns in the interim.  I had an extensive discussion of the risks and benefits of the lumbar decompression with the patient including bleeding, infection, damage to neurovascular structures, epidural fibrosis, CSF leak requiring repair. We also discussed increase in pain, adjacent segment disease, recurrent disc herniation, need for future surgery including repeat decompression and/or fusion. We also discussed risks of postoperative hematoma, paralysis, anesthetic complications including DVT, PE, death, cardiopulmonary dysfunction. In addition, the perioperative and postoperative courses were discussed in detail including the rehabilitative time and return to functional activity and work. I provided the patient with an illustrated handout and utilized the appropriate surgical models.  Plan microlumbar decompression L5-S1 left  Signed electronically by Lacie Draft PA-C for Dr. Tonita Cong

## 2014-07-31 NOTE — Brief Op Note (Signed)
07/31/2014  10:21 AM  PATIENT:  Craig [name]  49 y.o. male  PRE-OPERATIVE DIAGNOSIS:  H&P AND STENOSIS L5-S1 ON LEFT  POST-OPERATIVE DIAGNOSIS:  HNP and stenosis L5-S1 left  PROCEDURE:  Procedure(s): LUMBAR LAMINECTOMY/DECOMPRESSION MICRODISCECTOMY 1 LEVEL L5-S1 ON LEFT (Left)  SURGEON:  Surgeon(s) and Role:    * Susa Day, MD - Primary  PHYSICIAN ASSISTANT:   ASSISTANTS: [place]   ANESTHESIA:   general  EBL:  Total I/O In: 1000 [I.V.:1000] Out: -  EBL 50cc BLOOD ADMINISTERED:none  DRAINS: none   LOCAL MEDICATIONS USED:  MARCAINE     SPECIMEN:  Source of Specimen:  L5S1  DISPOSITION OF SPECIMEN:  PATHOLOGY  COUNTS:  YES  TOURNIQUET:  * No tourniquets in log *  DICTATION: .Other Dictation: Dictation Number [id number]  PLAN OF CARE: Admit for overnight observation  PATIENT DISPOSITION:  PACU - hemodynamically stable.   Delay start of Pharmacological VTE agent (>24hrs) due to surgical blood loss or risk of bleeding: yes

## 2014-07-31 NOTE — Transfer of Care (Signed)
Immediate Anesthesia Transfer of Care Note  Patient: Craig [name]  Procedure(s) Performed: Procedure(s): LUMBAR LAMINECTOMY/DECOMPRESSION MICRODISCECTOMY 1 LEVEL L5-S1 ON LEFT (Left)  Patient Location: PACU  Anesthesia Type:General  Level of Consciousness: awake, alert , oriented and patient cooperative  Airway & Oxygen Therapy: Patient Spontanous Breathing and Patient connected to face mask oxygen  Post-op Assessment: Report given to RN, Post -op Vital signs reviewed and stable and Patient moving all extremities  Post vital signs: Reviewed and stable  Last Vitals:  Filed Vitals:   07/31/14 0616  BP: 128/86  Pulse: 74  Temp: 36.5 C  Resp: 18    Complications: No apparent anesthesia complications

## 2014-07-31 NOTE — Progress Notes (Signed)
Pt voided 500 cc. Bladder scan revealed no urine remaining in bladder

## 2014-08-01 ENCOUNTER — Encounter (HOSPITAL_COMMUNITY): Payer: Self-pay | Admitting: Specialist

## 2014-08-01 DIAGNOSIS — M5127 Other intervertebral disc displacement, lumbosacral region: Secondary | ICD-10-CM | POA: Diagnosis not present

## 2014-08-01 LAB — BASIC METABOLIC PANEL
Anion gap: 8 (ref 5–15)
BUN: 15 mg/dL (ref 6–23)
CALCIUM: 8.6 mg/dL (ref 8.4–10.5)
CO2: 23 mmol/L (ref 19–32)
Chloride: 103 mmol/L (ref 96–112)
Creatinine, Ser: 1.29 mg/dL (ref 0.50–1.35)
GFR calc Af Amer: 75 mL/min — ABNORMAL LOW (ref >90)
GFR calc non Af Amer: 65 mL/min — ABNORMAL LOW (ref >90)
GLUCOSE: 107 mg/dL — AB (ref 70–99)
Potassium: 4.1 mmol/L (ref 3.5–5.1)
SODIUM: 134 mmol/L — AB (ref 135–145)

## 2014-08-01 NOTE — Progress Notes (Signed)
Physical Therapy Discharge Patient Details Name: Craig [name] MRN: [id number] DOB: June 05, 1965 Today's Date: 08/01/2014 Time: 0907-0920 PT Time Calculation (min) (ACUTE ONLY): 13 min  Patient discharged from PT services secondary to goals met and no further PT needs identified.  Please see latest therapy progress note for current level of functioning and progress toward goals.    Progress and discharge plan discussed with patient and/or caregiver: Patient/Caregiver agrees with plan. Encouraged pt to walk in halls independently.   GP Functional Assessment Tool Used: clinical judgement Functional Limitation: Mobility: Walking and moving around Mobility: Walking and Moving Around Current Status [phone]): 0 percent impaired, limited or restricted Mobility: Walking and Moving Around Goal Status [phone]): 0 percent impaired, limited or restricted Mobility: Walking and Moving Around Discharge Status [phone]): 0 percent impaired, limited or restricted   Philomena Doheny 08/01/2014, 12:02 PM

## 2014-08-01 NOTE — Evaluation (Signed)
Physical Therapy Evaluation Patient Details Name: Craig [name] MRN: [id number] DOB: [birth year] Today's Date: 08/01/2014   History of Present Illness  s/p L5-S1 lumbar decompression L  Clinical Impression  Pt ambulated 450' without an assistive device and went up/down 4 stairs independently. Pt is independent with mobility and is ready to DC home from PT standpoint.     Follow Up Recommendations No PT follow up    Equipment Recommendations  None recommended by PT    Recommendations for Other Services       Precautions / Restrictions Precautions Precautions: Back Restrictions Weight Bearing Restrictions: No      Mobility  Bed Mobility Overal bed mobility: Needs Assistance Bed Mobility: Sidelying to Sit   Sidelying to sit: Supervision       General bed mobility comments: cues for technique  Transfers Overall transfer level: Modified independent Equipment used: None Transfers: Sit to/from Stand Sit to Stand: Modified independent (Device/Increase time) Stand pivot transfers: Supervision       General transfer comment: used armrests to push up  Ambulation/Gait Ambulation/Gait assistance: Independent Ambulation Distance (Feet): 450 Feet Assistive device: None Gait Pattern/deviations: WFL(Within Functional Limits)   Gait velocity interpretation: Below normal speed for age/gender General Gait Details: steady without AD  Stairs Stairs: Yes Stairs assistance: Modified independent (Device/Increase time) Stair Management: One rail Right;Forwards;Alternating pattern;Step to pattern Number of Stairs: 4 General stair comments: steady with one handrail; step-to ascending, alternating descending  Wheelchair Mobility    Modified Rankin (Stroke Patients Only)       Balance Overall balance assessment: Independent                                           Pertinent Vitals/Pain Pain Assessment: 0-10 Pain Score: 1  Faces Pain Scale: Hurts a  little bit Pain Location: incision Pain Descriptors / Indicators: Sore Pain Intervention(s): Monitored during session;Premedicated before session    Home Living Family/patient expects to be discharged to:: Private residence Living Arrangements: Spouse/significant other Available Help at Discharge: Family;Available 24 hours/day Type of Home: House Home Access: Stairs to enter Entrance Stairs-Rails: Right Entrance Stairs-Number of Steps: 1+ 3 +3 Home Layout: One level Home Equipment: Shower seat - built in;Cane - single point      Prior Function Level of Independence: Independent         Comments: works as Statistician at [employer]        Extremity/Trunk Assessment   Upper Extremity Assessment: Overall WFL for tasks assessed           Lower Extremity Assessment: Overall WFL for tasks assessed      Cervical / Trunk Assessment: Normal  Communication   Communication: No difficulties  Cognition Arousal/Alertness: Awake/alert Behavior During Therapy: WFL for tasks assessed/performed Overall Cognitive Status: Within Functional Limits for tasks assessed                      General Comments      Exercises        Assessment/Plan    PT Assessment Patent does not need any further PT services  PT Diagnosis Acute pain   PT Problem List    PT Treatment Interventions     PT Goals (Current goals can be found in the Care Plan section) Acute Rehab PT Goals Patient Stated Goal: decreased pain  PT Goal Formulation: All assessment and education complete, DC therapy    Frequency     Barriers to discharge        Co-evaluation               End of Session   Activity Tolerance: Patient tolerated treatment well Patient left: in chair;with call bell/phone within reach;with family/visitor present Nurse Communication: Mobility status    Functional Assessment Tool Used: clinical judgement Functional Limitation:  Mobility: Walking and moving around Mobility: Walking and Moving Around Current Status ([id number]): 0 percent impaired, limited or restricted Mobility: Walking and Moving Around Goal Status [phone]): 0 percent impaired, limited or restricted Mobility: Walking and Moving Around Discharge Status [phone]): 0 percent impaired, limited or restricted    Time: 0907-0920 PT Time Calculation (min) (ACUTE ONLY): 13 min   Charges:   PT Evaluation $Initial PT Evaluation Tier I: 1 Procedure     PT G Codes:   PT G-Codes **NOT FOR INPATIENT CLASS** Functional Assessment Tool Used: clinical judgement Functional Limitation: Mobility: Walking and moving around Mobility: Walking and Moving Around Current Status (B3435): 0 percent impaired, limited or restricted Mobility: Walking and Moving Around Goal Status (W8616): 0 percent impaired, limited or restricted Mobility: Walking and Moving Around Discharge Status [phone]): 0 percent impaired, limited or restricted    Philomena Doheny 08/01/2014, 9:26 AM [phone]

## 2014-08-01 NOTE — Care Management Note (Signed)
    Page 1 of 1   08/01/2014     2:00:57 PM CARE MANAGEMENT NOTE 08/01/2014  Patient:  FRANCIS, DOENGES   Account Number:  [id number]  Date Initiated:  08/01/2014  Documentation initiated by:  [facility]  Subjective/Objective Assessment:   OUTPT     Action/Plan:   discharge planning   Anticipated DC Date:  08/01/2014   Anticipated DC Plan:  [place]  CM consult      Choice offered to / List presented to:             Status of service:  Completed, signed off Medicare Important Message given?   (If response is "NO", the following Medicare IM given date fields will be blank) Date Medicare IM given:   Medicare IM given by:   Date Additional Medicare IM given:   Additional Medicare IM given by:    Discharge Disposition:  HOME/SELF CARE  Per UR Regulation:  Reviewed for med. necessity/level of care/duration of stay  If discussed at [place] of Stay Meetings, dates discussed:    Comments:  08/01/14 CM notes no PT follow up recc. and no DME needed. No other CM needs wee communicated.  Tempie Hoist, BSN, [place].

## 2014-08-01 NOTE — Progress Notes (Signed)
Subjective: 1 Day Post-Op Procedure(s) (LRB): LUMBAR LAMINECTOMY/DECOMPRESSION MICRODISCECTOMY 1 LEVEL L5-S1 ON LEFT (Left) Patient reports pain as mild.  Reports leg pain improved. Back pain well controlled. Voiding without difficulty. No BM yet. His wife is thinking he may need to stay another day. He did well with PT this morning. Seen by myself and Dr. Tonita Cong  Objective: Vital signs in last 24 hours: Temp:  [97.3 F (36.3 C)-98.3 F (36.8 C)] 97.5 F (36.4 C) (03/10 0603) Pulse Rate:  [64-78] 64 (03/10 0603) Resp:  [13-18] 14 (03/10 0603) BP: (106-144)/(60-79) 109/69 mmHg (03/10 0603) SpO2:  [96 %-100 %] 99 % (03/10 0603)  Intake/Output from previous day: 03/09 0701 - 03/10 0700 In: 3120 [P.O.:360; I.V.:2760] Out: 2115 [Urine:2075; Blood:40] Intake/Output this shift: Total I/O In: -  Out: 625 [Urine:625]  No results for input(s): HGB in the last 72 hours. No results for input(s): WBC, RBC, HCT, PLT in the last 72 hours.  Recent Labs  08/01/14 0531  NA 134*  K 4.1  CL 103  CO2 23  BUN 15  CREATININE 1.29  GLUCOSE 107*  CALCIUM 8.6   No results for input(s): LABPT, INR in the last 72 hours.  Neurologically intact ABD soft Neurovascular intact Sensation intact distally Intact pulses distally Dorsiflexion/Plantar flexion intact Incision: dressing C/D/I and no drainage No cellulitis present Compartment soft no calf pain or sign of DVT  Assessment/Plan: 1 Day Post-Op Procedure(s) (LRB): LUMBAR LAMINECTOMY/DECOMPRESSION MICRODISCECTOMY 1 LEVEL L5-S1 ON LEFT (Left) Advance diet Up with therapy D/C IV fluids  Discussed D/C instructions, precautions, follow up Plan possible D/C later today as long as he is doing well, pain controlled, voiding without difficulty  Craig [name] M. 08/01/2014, 9:43 AM

## 2014-08-01 NOTE — Op Note (Signed)
Craig [name], Craig [name] NO.:  [id number]  MEDICAL RECORD NO.:  [id number]  LOCATION:  8786                         FACILITY:  [facility]  PHYSICIAN:  Susa Day, M.D.    DATE OF BIRTH:  Feb 10, 1966  DATE OF PROCEDURE:  07/31/2014 DATE OF DISCHARGE:                              OPERATIVE REPORT   PREOPERATIVE DIAGNOSIS:  Spinal stenosis, herniated nucleus pulposus of L5-S1, left.  POSTOPERATIVE DIAGNOSIS:  Spinal stenosis, herniated nucleus pulposus of L5-S1, left.  PROCEDURE: 1. Microlumbar decompression of L5-S1, left. 2. Foraminotomies of L5-S1, left. 3. Microdiskectomy of L5-S1, left.  ANESTHESIA:  General.  SURGEON:  Susa Day, M.D.  ASSISTANT:  Cleophas Dunker, PA.  HISTORY:  A 49 year old with left lower extremity radicular pain, S1 nerve root distribution with extension, relieved with flexion, temporarily from an epidural.  No back pain.  S1 nerve root distribution.  EHL weakness.  Diminished plantar flexion.  MRI, disc herniation 5-1, central.  He had no right-sided symptoms.  All left- sided symptoms.  He had a negative straight leg raise on the right.  He was indicated for microlumbar decompression, foraminotomies of S1 and L5 to decompress the S1 nerve root.  He had a disc degeneration of 5-1, neural foraminal stenosis.  We discussed decompressing L5-S1 nerve roots.  Risks and benefits discussed including bleeding, infection, damage to neurovascular structures, DVT, PE, anesthetic complications, no change in symptoms, worsening symptoms, anesthetic complications, need for fusion in the future, etc.  TECHNIQUE:  With the patient in supine position after induction of adequate general anesthesia, 3 g Kefzol, placed prone on the Hollister frame.  All bony prominences were well padded.  Lumbar region was prepped and draped in usual sterile fashion.  Two 18-gauge spinal needles were utilized to localize 5-1 interspace, confirmed with  x-ray. Incision was made from spinous process of L5-S1.  Subcutaneous tissue was dissected.  Electrocautery was utilized to achieve hemostasis. Dorsolumbar fascia was identified and divided in line with the skin incision after the muscle was infiltrated the 0.25% Marcaine with epinephrine.  Paraspinous muscle elevated from lamina 5 and 1, McCullough retractor was placed, confirmatory radiograph obtained. Operating microscope was draped and brought into the surgical field. Hemilaminotomy of the caudad edge of 5 was performed with a Leksell rongeur.  There was hypertrophic facet noted.  We used an osteotome to remove the inferior portion of the process of 5 that exposed the superior articulating process of S1.  I detached the ligamentum flavum from the cephalad edge of S1.  I performed a foraminotomy, placed a neuro patty beneath the ligamentum flavum.  After generous foraminotomy protecting the S1 nerve root, I decompressed the lateral recess to the medial border of the pedicle.  Removed the ligamentum flavum from the interspace.  There was severe compression of the S1 nerve root in the lateral recess secondary to facet hypertrophy, ligamentum flavum hypertrophy, and disc herniation.  Continued to decompress the lateral recess to the pedicle of 5.  A neural probe passed up the foramen and found to be stenotic in the 5 foramen and meticulously performed a 5 foraminotomy from the opposite side of the operating room table undercutting the facet and  performed a generous foraminotomy.  Neural elements protected all times at the 5 root.  Following this, we decompressed the 5 root to the pedicle of 5 protecting the nerve root at all times.  Neuro probe passed freely up the foramen after the decompression.  A focal disc herniation was noted, laterally and centrally at 5-1.  I performed an annulotomy and removed copious portion of disc material with straight and upbiting pituitary, further  mobilized with a nerve hook.  We obtained a confirmatory radiograph at 5-1. Copiously irrigated disc space with antibiotic irrigation by catheter lavage and then removed additional fragment with a micropituitary.  A Woodson retractor passed freely at the foramens of 5 and S1 and nerve root of S1, particularly had 1 cm of excursion of the S1 nerve root, medial pedicle without tension.  No residual compression was noted.  No motion was noted at the motion segment.  Copiously irrigated.  No active bleeding or CSF leakage.  I then removed the [facility] retractor, irrigated the paraspinous musculature.  No active bleeding.  Closed the fascia with 1 Vicryl, subcutaneous with 2-0, and skin with subcuticular Prolene.  Sterile dressing applied.  Placed supine on the hospital bed, extubated without difficulty, and transported to the recovery room in satisfactory condition.  The patient tolerated the procedure well.  No complications.  Assistant, Cleophas Dunker, PA, was used for patient positioning, gentle intermittent neural traction, closure, etc.     Susa Day, M.D.     Geralynn Rile  D:  07/31/2014  T:  08/01/2014  Job:  [id number]

## 2014-08-01 NOTE — Evaluation (Signed)
Occupational Therapy Evaluation Patient Details Name: Craig [name] MRN: [id number] DOB: 1965-11-17 Today's Date: 08/01/2014    History of Present Illness s/p L5-S1 lumbar decompression    Clinical Impression   This 49 year old man was admitted for the above surgery.  Reviewed education for back precautions and ADLs/bathroom transfers.  Pt verbalizes understanding of all and doesn't need further OT.    Follow Up Recommendations  No OT follow up    Equipment Recommendations  None recommended by OT    Recommendations for Other Services       Precautions / Restrictions Precautions Precautions: Back Restrictions Weight Bearing Restrictions: No      Mobility Bed Mobility Overal bed mobility: Needs Assistance Bed Mobility: Sidelying to Sit   Sidelying to sit: Supervision       General bed mobility comments: cues for technique  Transfers Overall transfer level: Needs assistance Equipment used: None Transfers: Sit to/from Stand;Stand Pivot Transfers Sit to Stand: Supervision Stand pivot transfers: Supervision       General transfer comment: cues to power up with legs    Balance                                            ADL Overall ADL's : Needs assistance/impaired                                       General ADL Comments: reviewed back precautions and ADLs with pt.  He feels he will be able to access high commode without problems.  He has a h/o THA.  He is able to complete ADL with set up for UB and min A for LB.  He has a long reach and can start pants.  Demonstrated alternative methods for ADLs. Educated on AE     Vision     Perception     Praxis      Pertinent Vitals/Pain Pain Assessment: 0-10 Pain Score: 2  Faces Pain Scale: Hurts a little bit Pain Location: incision Pain Descriptors / Indicators: Sore Pain Intervention(s): Monitored during session;Premedicated before session     Hand Dominance      Extremity/Trunk Assessment Upper Extremity Assessment Upper Extremity Assessment: Overall WFL for tasks assessed      Cervical / Trunk Assessment Cervical / Trunk Assessment: Normal   Communication Communication Communication: No difficulties   Cognition Arousal/Alertness: Awake/alert Behavior During Therapy: WFL for tasks assessed/performed Overall Cognitive Status: Within Functional Limits for tasks assessed                     General Comments       Exercises       Shoulder Instructions      Home Living Family/patient expects to be discharged to:: Private residence Living Arrangements: Spouse/significant other Available Help at Discharge: Family;Available 24 hours/day Type of Home: House Home Access: Stairs to enter [employer] of Steps: 1+ 3 +3 Entrance Stairs-Rails: Right Home Layout: One level     Bathroom Shower/Tub: Occupational psychologist: Handicapped height     Home Equipment: Shower seat - built in;Cane - single point          Prior Functioning/Environment Level of Independence: Independent        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    OT Diagnosis: Generalized weakness   OT Problem List:     OT Treatment/Interventions:      OT Goals(Current goals can be found in the care plan section) Acute Rehab OT Goals Patient Stated Goal: decreased pain  OT Frequency:     Barriers to D/C:            Co-evaluation              End of Session    Activity Tolerance: Patient tolerated treatment well Patient left: in chair;with call bell/phone within reach   Time: 0742-0758 OT Time Calculation (min): 16 min Charges:  OT General Charges $OT Visit: 1 Procedure OT Evaluation $Initial OT Evaluation Tier I: 1 Procedure G-Codes: OT G-codes **NOT FOR INPATIENT CLASS** Functional Assessment Tool Used: clinical judgment Functional Limitation: Self care Self Care Current Status (D2897): At  least 20 percent but less than 40 percent impaired, limited or restricted Self Care Goal Status (V1504): At least 20 percent but less than 40 percent impaired, limited or restricted Self Care Discharge Status [phone]): At least 20 percent but less than 40 percent impaired, limited or restricted  [facility] 08/01/2014, 9:24 AM  Lesle Chris, OTR/L [phone] 08/01/2014

## 2014-08-02 DIAGNOSIS — M5127 Other intervertebral disc displacement, lumbosacral region: Secondary | ICD-10-CM | POA: Diagnosis not present

## 2014-08-02 NOTE — Discharge Summary (Signed)
Physician Discharge Summary   Patient ID: Craig [name] MRN: [id number] DOB/AGE: July 18, 1965 49 y.o.  Admit date: 07/31/2014 Discharge date: 08/02/2014  Primary Diagnosis:   H&P AND STENOSIS L5-S1 ON LEFT  Admission Diagnoses:  Past Medical History  Diagnosis Date  . Hypertension   . Hyperlipidemia   . Hypogonadism male   . Pre-diabetes   . Vertigo   . Sleep apnea     pt does not use CPAP  . GERD (gastroesophageal reflux disease)   . 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 Tinnitus   . PTSD (post-traumatic stress disorder)   . Eye abnormalities     pt states has had twice that the right eye has come out of socket    Discharge Diagnoses:   Principal Problem:   Spinal stenosis of lumbar region Active Problems:   Lumbar spinal stenosis  Procedure:  Procedure(s) (LRB): LUMBAR LAMINECTOMY/DECOMPRESSION MICRODISCECTOMY 1 LEVEL L5-S1 ON LEFT (Left)   Consults: None  HPI:  see H&P    Laboratory Data: Office Visit on 07/15/2014  Component Date Value Ref Range Status  . WBC 07/15/2014 8.2  4.0 - 10.5 K/uL Final  . RBC 07/15/2014 5.41  4.22 - 5.81 MIL/uL Final  . Hemoglobin 07/15/2014 15.0  13.0 - 17.0 g/dL Final  . HCT 07/15/2014 44.4  39.0 - 52.0 % Final  . MCV 07/15/2014 82.1  78.0 - 100.0 fL Final  . MCH 07/15/2014 27.7  26.0 - 34.0 pg Final  . MCHC 07/15/2014 33.8  30.0 - 36.0 g/dL Final  . RDW 07/15/2014 15.2  11.5 - 15.5 % Final  . Platelets 07/15/2014 263  150 - 400 K/uL Final  . MPV 07/15/2014 11.4  8.6 - 12.4 fL Final  . Neutrophils Relative % 07/15/2014 56  43 - 77 % Final  . Neutro Abs 07/15/2014 4.6  1.7 - 7.7 K/uL Final  . Lymphocytes Relative 07/15/2014 32  12 - 46 % Final  . Lymphs Abs 07/15/2014 2.6  0.7 - 4.0 K/uL Final  . Monocytes Relative 07/15/2014 9  3 - 12 % Final  . Monocytes Absolute 07/15/2014 0.7  0.1 - 1.0 K/uL Final  . Eosinophils Relative 07/15/2014 3  0 - 5 % Final  . Eosinophils Absolute 07/15/2014 0.2   0.0 - 0.7 K/uL Final  . Basophils Relative 07/15/2014 0  0 - 1 % Final  . Basophils Absolute 07/15/2014 0.0  0.0 - 0.1 K/uL Final  . Smear Review 07/15/2014 Criteria for review not met   Final  . Sodium 07/15/2014 139  135 - 145 mEq/L Final  . Potassium 07/15/2014 3.5  3.5 - 5.3 mEq/L Final  . Chloride 07/15/2014 100  96 - 112 mEq/L Final  . CO2 07/15/2014 27  19 - 32 mEq/L Final  . Glucose, Bld 07/15/2014 83  70 - 99 mg/dL Final  . BUN 07/15/2014 13  6 - 23 mg/dL Final  . Creat 07/15/2014 1.23  0.50 - 1.35 mg/dL Final  . Calcium 07/15/2014 9.7  8.4 - 10.5 mg/dL Final  . GFR, Est African American 07/15/2014 81   Final  . GFR, Est Non African American 07/15/2014 70   Final   Comment:   The estimated GFR is a calculation valid for adults (>=24 years old) that uses the CKD-EPI algorithm to adjust for age and sex. It is   not to be used for children, pregnant women, hospitalized patients,    patients  on dialysis, or with rapidly changing kidney function. According to the NKDEP, eGFR >89 is normal, 60-89 shows mild impairment, 30-59 shows moderate impairment, 15-29 shows severe impairment and <15 is ESRD.     Marland Kitchen Total Bilirubin 07/15/2014 0.4  0.2 - 1.2 mg/dL Final  . Bilirubin, Direct 07/15/2014 0.1  0.0 - 0.3 mg/dL Final  . Indirect Bilirubin 07/15/2014 0.3  0.2 - 1.2 mg/dL Final  . Alkaline Phosphatase 07/15/2014 52  39 - 117 U/L Final  . AST 07/15/2014 40* 0 - 37 U/L Final  . ALT 07/15/2014 42  0 - 53 U/L Final  . Total Protein 07/15/2014 7.5  6.0 - 8.3 g/dL Final  . Albumin 07/15/2014 4.7  3.5 - 5.2 g/dL Final  . Magnesium 07/15/2014 2.0  1.5 - 2.5 mg/dL Final  . Cholesterol 07/15/2014 173  0 - 200 mg/dL Final   Comment: ATP III Classification:       < 200        mg/dL        Desirable      200 - 239     mg/dL        Borderline High      >= 240        mg/dL        High     . Triglycerides 07/15/2014 205* <150 mg/dL Final  . HDL 07/15/2014 36* >=40 mg/dL Final   ** Please note  change in reference range(s). **  . Total CHOL/HDL Ratio 07/15/2014 4.8   Final  . VLDL 07/15/2014 41* 0 - 40 mg/dL Final  . LDL Cholesterol 07/15/2014 96  0 - 99 mg/dL Final   Comment:   Total Cholesterol/HDL Ratio:CHD Risk                        Coronary Heart Disease Risk Table                                        Men       Women          1/2 Average Risk              3.4        3.3              Average Risk              5.0        4.4           2X Average Risk              9.6        7.1           3X Average Risk             23.4       11.0 Use the calculated Patient Ratio above and the CHD Risk table  to determine the patient's CHD Risk. ATP III Classification (LDL):       < 100        mg/dL         Optimal      100 - 129     mg/dL         Near or Above Optimal      130 - 159     mg/dL  Borderline High      160 - 189     mg/dL         High       > 190        mg/dL         Very High     . TSH 07/15/2014 2.613  0.350 - 4.500 uIU/mL Final  . Hgb A1c MFr Bld 07/15/2014 6.7* <5.7 % Final   Comment:                                                                        According to the ADA Clinical Practice Recommendations for 2011, when HbA1c is used as a screening test:     >=6.5%   Diagnostic of Diabetes Mellitus            (if abnormal result is confirmed)   5.7-6.4%   Increased risk of developing Diabetes Mellitus   References:Diagnosis and Classification of Diabetes Mellitus,Diabetes RUEA,5409,81(XBJYN 1):S62-S69 and Standards of Medical Care in         Diabetes - 2011,Diabetes Care,2011,34 (Suppl 1):S11-S61.     . Mean Plasma Glucose 07/15/2014 146* <117 mg/dL Final  . Insulin fasting, serum 07/15/2014 101.0* 2.0 - 19.6 uIU/mL Final   Comment:   This insulin assay shows strong cross-reactivity for some insulin analogs (lispro, aspart, and glargine) and much lower cross-reactivity with others (detemir, glulisine).   Stimulated Insulin reference intervals  were established using the Siemens Immulite assay. These values are provided for general guidance only.   . Vit D, 25-Hydroxy 07/15/2014 86  30 - 100 ng/mL Final   Comment: Vitamin D Status           25-OH Vitamin D        Deficiency                <20 ng/mL        Insufficiency         20 - 29 ng/mL        Optimal             > or = 30 ng/mL   For 25-OH Vitamin D testing on patients on D2-supplementation and patients for whom quantitation of D2 and D3 fractions is required, the QuestAssureD 25-OH VIT D, (D2,D3), LC/MS/MS is recommended: order code [id number] (patients > 2 yrs).    No results for input(s): HGB in the last 72 hours. No results for input(s): WBC, RBC, HCT, PLT in the last 72 hours.  Recent Labs  08/01/14 0531  NA 134*  K 4.1  CL 103  CO2 23  BUN 15  CREATININE 1.29  GLUCOSE 107*  CALCIUM 8.6   No results for input(s): LABPT, INR in the last 72 hours.  X-Rays:Dg Lumbar Spine 2-3 Views  07/24/2014   CLINICAL DATA:  Preop lumbar decompression surgery  EXAM: LUMBAR SPINE - 2-3 VIEW  COMPARISON:  None available  FINDINGS: Normal lumbar spine alignment. Advanced lumbar degenerative disc disease and spondylosis at L5-S1 with disc space narrowing, sclerosis and osteophyte formation. Preserved vertebral body heights. Prior right hip arthroplasty noted. Normal bowel gas pattern. No abnormal calcifications.  IMPRESSION: Advanced L5-S1 spondylosis and degenerative disc disease.   Electronically Signed  By: Eugenie Filler M.D.   On: 07/24/2014 09:24   Dg Spine Portable 1 View  07/31/2014   CLINICAL DATA:  Lumbar laminectomy at L5-S1  EXAM: PORTABLE SPINE - 1 VIEW  COMPARISON:  Film from earlier in the same day  FINDINGS: Lateral view of the lumbar spine again reveals surgical instruments posterior to the L5-S1 level. There are now instruments within the spinal canal at the L5-S1 level as well as at the S1 level. Disc space narrowing is noted at L5-S1 with anterior and posterior  osteophytes.  IMPRESSION: Intraoperative localization as described.   Electronically Signed   By: Inez Catalina M.D.   On: 07/31/2014 10:08   Dg Spine Portable 1 View  07/31/2014   CLINICAL DATA:  Lumbar laminectomy L5-S1, subsequent encounter.  EXAM: PORTABLE SPINE - 1 VIEW  COMPARISON:  07/31/2014 at 0903 hr.  FINDINGS: Single cross-table lateral view of the lumbar spine,taken at 0915 hr, is submitted. Numbering system utilized on the comparison exam is preserved. Surgical instrument tips project posterior to S1. Degenerative disc disease at L5-S1.  IMPRESSION: Intraoperative localization, as above.   Electronically Signed   By: Lorin Picket M.D.   On: 07/31/2014 09:42   Dg Spine Portable 1 View  07/31/2014   CLINICAL DATA:  Lumbar laminectomy L5-S1.  EXAM: PORTABLE SPINE - 1 VIEW  COMPARISON:  07/24/2014.  FINDINGS: Single intraoperative cross-table lateral view of the lumbar spine is submitted. Numbering system utilized on 07/24/2014 is preserved. Surgical instrument tips project posterior to the L4 spinous process and S1 vertebral body. Advanced degenerative disc disease at L5-S1.  IMPRESSION: Intraoperative localization, as above.   Electronically Signed   By: Lorin Picket M.D.   On: 07/31/2014 09:20    EKG: Orders placed or performed in visit on 12/18/13  . EKG 12-Lead     Hospital Course: Patient was admitted to [facility] and taken to the OR and underwent the above state procedure without complications.  Patient tolerated the procedure well and was later transferred to the recovery room and then to the orthopaedic floor for postoperative care.  They were given PO and IV analgesics for pain control following their surgery.  They were given 24 hours of postoperative antibiotics.   PT was consulted postop to assist with mobility and transfers.  The patient was allowed to be WBAT with therapy and was taught back precautions. Discharge planning was consulted to help with postop  disposition and equipment needs.  Patient had a fair night on the evening of surgery and started to get up OOB with therapy on day one. Patient was seen in rounds and was ready to go home on day one.  They were given discharge instructions and dressing directions.  They were instructed on when to follow up in the office with Dr. Tonita Cong.   Diet: Regular diet Activity:WBAT; Lspine precautions Follow-up:in 10-14 days Disposition - Home Discharged Condition: good   Discharge Instructions    Call MD / Call 911    Complete by:  As directed   If you experience chest pain or shortness of breath, CALL 911 and be transported to the hospital emergency room.  If you develope a fever above 101 F, pus (white drainage) or increased drainage or redness at the wound, or calf pain, call your surgeon's office.     Constipation Prevention    Complete by:  As directed   Drink plenty of fluids.  Prune juice may be helpful.  You may use  a stool softener, such as Colace (over the counter) 100 mg twice a day.  Use MiraLax (over the counter) for constipation as needed.     Diet - low sodium heart healthy    Complete by:  As directed      Increase activity slowly as tolerated    Complete by:  As directed             Medication List    TAKE these medications        cetirizine 10 MG tablet  Commonly known as:  ZYRTEC  Take 1 tablet (10 mg total) by mouth daily.     docusate sodium 100 MG capsule  Commonly known as:  COLACE  Take 1 capsule (100 mg total) by mouth 2 (two) times daily as needed for mild constipation.     esomeprazole 20 MG capsule  Commonly known as:  NEXIUM  Take 1 capsule (20 mg total) by mouth daily as needed.     lisinopril-hydrochlorothiazide 20-12.5 MG per tablet  Commonly known as:  PRINZIDE,ZESTORETIC  TAKE 1 TABLET BY MOUTH ONCE A DAY FOR BLOOD PRESSURE     Magnesium 500 MG Caps  Take 500 mg by mouth daily.     MECLIZINE HCL PO  Take by mouth daily as needed.      mometasone 50 MCG/ACT nasal spray  Commonly known as:  NASONEX  Place 2 sprays into the nose daily.     oxyCODONE-acetaminophen 5-325 MG per tablet  Commonly known as:  PERCOCET  Take 1-2 tablets by mouth every 4 (four) hours as needed.     tiZANidine 4 MG capsule  Commonly known as:  ZANAFLEX  Take 1 capsule (4 mg total) by mouth 3 (three) times daily as needed for muscle spasms.     Vitamin D-3 5000 UNITS Tabs  Take 10,000 Units by mouth every other day.           Follow-up Information    Follow up with BEANE,JEFFREY C, MD In 2 weeks.   Specialty:  Orthopedic Surgery   Why:  For suture removal   Contact information:   [address] [place] [address] [phone]       Signed: Lacie Draft, PA-C Orthopaedic Surgery 08/02/2014, 7:34 AM

## 2014-08-02 NOTE — Progress Notes (Signed)
Subjective: 2 Days Post-Op Procedure(s) (LRB): LUMBAR LAMINECTOMY/DECOMPRESSION MICRODISCECTOMY 1 LEVEL L5-S1 ON LEFT (Left) Patient reports pain as mild.  Reports incisional back soreness, no leg pain. Back pain is somewhat improved today. Feeling ready to go home today. No other c/o.  Objective: Vital signs in last 24 hours: Temp:  [97.6 F (36.4 C)-98.5 F (36.9 C)] 98.5 F (36.9 C) (03/11 5638) Pulse Rate:  [65-75] 75 (03/11 0633) Resp:  [16] 16 (03/11 0633) BP: (103-128)/(61-76) 128/76 mmHg (03/11 0633) SpO2:  [96 %-99 %] 96 % (03/11 0633)  Intake/Output from previous day: 03/10 0701 - 03/11 0700 In: 1383.8 [P.O.:960; I.V.:423.8] Out: 625 [Urine:625] Intake/Output this shift:    No results for input(s): HGB in the last 72 hours. No results for input(s): WBC, RBC, HCT, PLT in the last 72 hours.  Recent Labs  08/01/14 0531  NA 134*  K 4.1  CL 103  CO2 23  BUN 15  CREATININE 1.29  GLUCOSE 107*  CALCIUM 8.6   No results for input(s): LABPT, INR in the last 72 hours.  Neurologically intact ABD soft Neurovascular intact Sensation intact distally Intact pulses distally Dorsiflexion/Plantar flexion intact Incision: dressing C/D/I and no drainage No cellulitis present Compartment soft no sign of DVT  Assessment/Plan: 2 Days Post-Op Procedure(s) (LRB): LUMBAR LAMINECTOMY/DECOMPRESSION MICRODISCECTOMY 1 LEVEL L5-S1 ON LEFT (Left) Advance diet Up with therapy D/C IV fluids  Discussed D/C instructions, precautions, dressing instructions D/C home today Follow up in office 10-14 days post-op for suture removal  Verdell Dykman M. 08/02/2014, 7:33 AM

## 2014-08-02 NOTE — Progress Notes (Signed)
RN reviewed discharge instructions with patient and wife. All questions answered.   Paperwork and prescriptions given.   NT rolled patient down in wheelchair to family car.

## 2014-09-02 ENCOUNTER — Encounter (HOSPITAL_COMMUNITY): Payer: Self-pay | Admitting: Family Medicine

## 2014-09-02 ENCOUNTER — Observation Stay (HOSPITAL_COMMUNITY)
Admission: EM | Admit: 2014-09-02 | Discharge: 2014-09-03 | Disposition: A | Discharge status: Home / Self Care | Payer: 59 | Attending: Internal Medicine | Admitting: Internal Medicine

## 2014-09-02 ENCOUNTER — Emergency Department (HOSPITAL_COMMUNITY): Payer: 59

## 2014-09-02 DIAGNOSIS — Z7951 Long term (current) use of inhaled steroids: Secondary | ICD-10-CM | POA: Diagnosis not present

## 2014-09-02 DIAGNOSIS — Z79899 Other long term (current) drug therapy: Secondary | ICD-10-CM | POA: Insufficient documentation

## 2014-09-02 DIAGNOSIS — G4733 Obstructive sleep apnea (adult) (pediatric): Secondary | ICD-10-CM

## 2014-09-02 DIAGNOSIS — K219 Gastro-esophageal reflux disease without esophagitis: Secondary | ICD-10-CM | POA: Insufficient documentation

## 2014-09-02 DIAGNOSIS — E785 Hyperlipidemia, unspecified: Secondary | ICD-10-CM | POA: Insufficient documentation

## 2014-09-02 DIAGNOSIS — R55 Syncope and collapse: Secondary | ICD-10-CM | POA: Diagnosis not present

## 2014-09-02 DIAGNOSIS — Z9842 Cataract extraction status, left eye: Secondary | ICD-10-CM | POA: Insufficient documentation

## 2014-09-02 DIAGNOSIS — I10 Essential (primary) hypertension: Secondary | ICD-10-CM | POA: Diagnosis not present

## 2014-09-02 DIAGNOSIS — G4730 Sleep apnea, unspecified: Secondary | ICD-10-CM | POA: Insufficient documentation

## 2014-09-02 DIAGNOSIS — F4310 Post-traumatic stress disorder, unspecified: Secondary | ICD-10-CM | POA: Diagnosis not present

## 2014-09-02 DIAGNOSIS — H8109 Meniere's disease, unspecified ear: Secondary | ICD-10-CM

## 2014-09-02 DIAGNOSIS — H5500 Unspecified nystagmus: Secondary | ICD-10-CM | POA: Diagnosis not present

## 2014-09-02 DIAGNOSIS — E669 Obesity, unspecified: Secondary | ICD-10-CM | POA: Diagnosis present

## 2014-09-02 DIAGNOSIS — Q159 Congenital malformation of eye, unspecified: Secondary | ICD-10-CM | POA: Insufficient documentation

## 2014-09-02 LAB — COMPREHENSIVE METABOLIC PANEL
ALK PHOS: 50 U/L (ref 39–117)
ALT: 37 U/L (ref 0–53)
ANION GAP: 8 (ref 5–15)
AST: 32 U/L (ref 0–37)
Albumin: 4.1 g/dL (ref 3.5–5.2)
BUN: 14 mg/dL (ref 6–23)
CALCIUM: 8.9 mg/dL (ref 8.4–10.5)
CO2: 27 mmol/L (ref 19–32)
Chloride: 104 mmol/L (ref 96–112)
Creatinine, Ser: 1.31 mg/dL (ref 0.50–1.35)
GFR, EST AFRICAN AMERICAN: 74 mL/min — AB (ref >90)
GFR, EST NON AFRICAN AMERICAN: 64 mL/min — AB (ref >90)
GLUCOSE: 121 mg/dL — AB (ref 70–99)
POTASSIUM: 3.7 mmol/L (ref 3.5–5.1)
SODIUM: 139 mmol/L (ref 135–145)
TOTAL PROTEIN: 7 g/dL (ref 6.0–8.3)
Total Bilirubin: 0.6 mg/dL (ref 0.3–1.2)

## 2014-09-02 LAB — CBG MONITORING, ED: GLUCOSE-CAPILLARY: 98 mg/dL (ref 70–99)

## 2014-09-02 LAB — CBC WITH DIFFERENTIAL/PLATELET
Basophils Absolute: 0 10*3/uL (ref 0.0–0.1)
Basophils Relative: 0 % (ref 0–1)
EOS ABS: 0.1 10*3/uL (ref 0.0–0.7)
EOS PCT: 1 % (ref 0–5)
HCT: 42.3 % (ref 39.0–52.0)
Hemoglobin: 14 g/dL (ref 13.0–17.0)
LYMPHS PCT: 14 % (ref 12–46)
Lymphs Abs: 1.6 10*3/uL (ref 0.7–4.0)
MCH: 27.3 pg (ref 26.0–34.0)
MCHC: 33.1 g/dL (ref 30.0–36.0)
MCV: 82.6 fL (ref 78.0–100.0)
Monocytes Absolute: 0.5 10*3/uL (ref 0.1–1.0)
Monocytes Relative: 4 % (ref 3–12)
NEUTROS PCT: 81 % — AB (ref 43–77)
Neutro Abs: 9.3 10*3/uL — ABNORMAL HIGH (ref 1.7–7.7)
PLATELETS: 205 10*3/uL (ref 150–400)
RBC: 5.12 MIL/uL (ref 4.22–5.81)
RDW: 14.8 % (ref 11.5–15.5)
WBC: 11.5 10*3/uL — AB (ref 4.0–10.5)

## 2014-09-02 LAB — I-STAT TROPONIN, ED: Troponin i, poc: 0 ng/mL (ref 0.00–0.08)

## 2014-09-02 LAB — D-DIMER, QUANTITATIVE: D-Dimer, Quant: 0.29 ug/mL-FEU (ref 0.00–0.48)

## 2014-09-02 LAB — I-STAT CG4 LACTIC ACID, ED: Lactic Acid, Venous: 1.59 mmol/L (ref 0.5–2.0)

## 2014-09-02 LAB — TSH: TSH: 0.783 u[IU]/mL (ref 0.350–4.500)

## 2014-09-02 LAB — TROPONIN I: Troponin I: <0.03 ng/mL (ref <0.031)

## 2014-09-02 MED ORDER — ONDANSETRON HCL 4 MG PO TABS: 4.0000 mg | ORAL_TABLET | Freq: Four times a day (QID) | ORAL | Status: DC | PRN

## 2014-09-02 MED ORDER — ONDANSETRON HCL 4 MG/2ML IJ SOLN: 4.0000 mg | Freq: Four times a day (QID) | INTRAMUSCULAR | Status: DC | PRN

## 2014-09-02 MED ORDER — LORATADINE 10 MG PO TABS
10.0000 mg | ORAL_TABLET | Freq: Every day | ORAL | Status: DC
  Filled 2014-09-02: qty 1

## 2014-09-02 MED ORDER — SODIUM CHLORIDE 0.9 % IJ SOLN: 3.0000 mL | INTRAMUSCULAR | Status: DC | PRN

## 2014-09-02 MED ORDER — SODIUM CHLORIDE 0.9 % IV SOLN
250.0000 mL | INTRAVENOUS | Status: DC | PRN
Start: 2014-09-02 — End: 2014-09-03

## 2014-09-02 MED ORDER — ACETAMINOPHEN 325 MG PO TABS
650.0000 mg | ORAL_TABLET | Freq: Four times a day (QID) | ORAL | Status: DC | PRN
  Administered 2014-09-02: 650 mg via ORAL
  Filled 2014-09-02: qty 2

## 2014-09-02 MED ORDER — ACETAMINOPHEN 650 MG RE SUPP: 650.0000 mg | Freq: Four times a day (QID) | RECTAL | Status: DC | PRN

## 2014-09-02 MED ORDER — SODIUM CHLORIDE 0.9 % IJ SOLN
3.0000 mL | Freq: Two times a day (BID) | INTRAMUSCULAR | Status: DC
  Administered 2014-09-02 – 2014-09-03 (×2): 3 mL via INTRAVENOUS

## 2014-09-02 NOTE — ED Notes (Signed)
X-ray at bedside

## 2014-09-02 NOTE — ED Notes (Addendum)
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 EMS was contacted when patient c/o dizziness and was extremely diaphoretic and dizzy.  Per EMS patient had one syncopal episode, initial BP was 80/50.  HR has remained around 60bpm.  Pt is A&Ox4 at this time and skin is warm and dry. Pt received 833mL NS and 4mg  IV Zofran PTA.

## 2014-09-02 NOTE — ED Notes (Signed)
Admitting MD at bedside.

## 2014-09-02 NOTE — ED Provider Notes (Signed)
CSN: [id number]     Arrival date & time 09/02/14  1440 History   First MD Initiated Contact with Patient 09/02/14 1448     Chief Complaint  Patient presents with  . Loss of Consciousness     Patient is a 49 y.o. male presenting with syncope. The history is provided by the patient. No language interpreter was used.  Loss of Consciousness  Mr. Trinka presents for evaluation of syncope. He was at work and ate lunch and after that became considerably dizzy with diaphoresis and he was unable to get up. He called for help and EMS was called. EMS reports a blood pressure in the 63K systolic and considerable diaphoresis. Patient described a persistent dizziness-type sensation or vertigo sensation. He denies any chest pain, shortness of breath, abdominal pain, vomiting, leg swelling or pain. He had back surgery in early March and has been very active since then and has had no change in activities recently. Symptoms are severe, constant, improving.  Past Medical History  Diagnosis Date  . Hypertension   . Hyperlipidemia   . Hypogonadism male   . Pre-diabetes   . Vertigo   . Sleep apnea     pt does not use CPAP  . GERD (gastroesophageal reflux disease)   . 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 Tinnitus   . PTSD (post-traumatic stress disorder)   . 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 Total hip arthroplasty Right   . Total hip arthroplasty      right   . Kidney surgery      CYST REMOVAL  . Cataract extraction Left   . Bunionectomy Bilateral   . Lumbar laminectomy/decompression microdiscectomy Left 07/31/2014    Procedure: LUMBAR LAMINECTOMY/DECOMPRESSION MICRODISCECTOMY 1 LEVEL L5-S1 ON LEFT;  Surgeon: Susa Day, MD;  Location: [facility];  Service: Orthopedics;  Laterality: Left;   Family History  Problem Relation Age of Onset  . Breast cancer Mother     History  Substance Use Topics  . Smoking status: Never Smoker   . Smokeless tobacco: Never Used  . Alcohol Use: No    Review of Systems  Cardiovascular: Positive for syncope.  All other systems reviewed and are negative.     Allergies  Ziac  Home Medications   Prior to Admission medications   Medication Sig Start Date End Date Taking? Authorizing Provider  cetirizine (ZYRTEC) 10 MG tablet Take 1 tablet (10 mg total) by mouth daily. 07/15/14   Unk Pinto, MD  Cholecalciferol (VITAMIN D-3) 5000 UNITS TABS Take 10,000 Units by mouth every other day.     Historical Provider, MD  docusate sodium (COLACE) 100 MG capsule Take 1 capsule (100 mg total) by mouth 2 (two) times daily as needed for mild constipation. 07/31/14   Susa Day, MD  esomeprazole (NEXIUM) 20 MG capsule Take 1 capsule (20 mg total) by mouth daily as needed. 07/15/14   Unk Pinto, MD  lisinopril-hydrochlorothiazide (PRINZIDE,ZESTORETIC) 20-12.5 MG per tablet TAKE 1 TABLET BY MOUTH ONCE A DAY FOR BLOOD PRESSURE 07/15/14   Unk Pinto, MD  Magnesium 500 MG CAPS Take 500 mg by mouth daily.    Historical Provider, MD  MECLIZINE HCL PO Take by mouth daily as needed.    Historical Provider, MD  mometasone (NASONEX) 50 MCG/ACT nasal spray  Place 2 sprays into the nose daily. 04/01/14 04/01/15  Vicie Mutters, PA-C  oxyCODONE-acetaminophen (PERCOCET) 5-325 MG per tablet Take 1-2 tablets by mouth every 4 (four) hours as needed. 07/31/14   Susa Day, MD  tiZANidine (ZANAFLEX) 4 MG capsule Take 1 capsule (4 mg total) by mouth 3 (three) times daily as needed for muscle spasms. 07/31/14   Susa Day, MD   SpO2 100% Physical Exam  Constitutional: He is oriented to person, place, and time. He appears well-developed and well-nourished. He appears distressed.  HENT:  Head: Normocephalic and atraumatic.  Eyes:  PERRL, minimal nystagmus horizontal gaze  Cardiovascular: Normal rate and regular rhythm.   No murmur  heard. Pulmonary/Chest: Effort normal and breath sounds normal. No respiratory distress.  Abdominal: Soft. There is no tenderness. There is no rebound and no guarding.  Musculoskeletal: He exhibits no edema or tenderness.  Neurological: He is alert and oriented to person, place, and time. No cranial nerve deficit.  Skin: Skin is warm and dry.  Psychiatric: He has a normal mood and affect. His behavior is normal.  Nursing note and vitals reviewed.   ED Course  Procedures (including critical care time) Labs Review Labs Reviewed  CBC WITH DIFFERENTIAL/PLATELET - Abnormal; Notable for the following:    WBC 11.5 (*)    Neutrophils Relative % 81 (*)    Neutro Abs 9.3 (*)    All other components within normal limits  COMPREHENSIVE METABOLIC PANEL - Abnormal; Notable for the following:    Glucose, Bld 121 (*)    GFR calc non Af Amer 64 (*)    GFR calc Af Amer 74 (*)    All other components within normal limits  D-DIMER, QUANTITATIVE  TSH  TROPONIN I  TROPONIN I  BASIC METABOLIC PANEL  CBC  TROPONIN I  I-STAT CG4 LACTIC ACID, ED  I-STAT TROPOININ, ED  CBG MONITORING, ED    Imaging Review Ct Head Wo Contrast  09/02/2014   CLINICAL DATA:  Syncopal episode. Dizziness. Diaphoretic. 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  EXAM: CT HEAD WITHOUT CONTRAST  TECHNIQUE: Contiguous axial images were obtained from the base of the skull through the vertex without intravenous contrast.  COMPARISON:  None.  FINDINGS: There is no intra or extra-axial fluid collection or mass lesion. The basilar cisterns and ventricles have a normal appearance. There is no CT evidence for acute infarction or hemorrhage. Bone windows are negative.  IMPRESSION: Negative exam.   Electronically Signed   By: Nolon Nations M.D.   On: 09/02/2014 15:40   Dg Chest Port 1 View  09/02/2014   CLINICAL DATA:  Loss of consciousness.  Hypertension  EXAM: PORTABLE CHEST - 1 VIEW  COMPARISON:   October 24, 2006  FINDINGS: There is no edema or consolidation. Heart is borderline enlarged with pulmonary vascularity within normal limits. No adenopathy. No bone lesions.  IMPRESSION: Borderline cardiac enlargement.  No edema or consolidation.   Electronically Signed   By: Lowella Grip III M.D.   On: 09/02/2014 15:23     EKG Interpretation   Date/Time:  Monday September 02 2014 14:44:30 EDT Ventricular Rate:  64 PR Interval:  166 QRS Duration: 97 QT Interval:  434 QTC Calculation: 448 R Axis:   42 Text Interpretation:  Sinus rhythm ST elev, probable normal early repol  pattern Confirmed by Hazle Coca [phone]) on 09/02/2014 2:52:47 PM      MDM   Final diagnoses:  Syncope, unspecified  syncope type   patient here for evaluation following syncopal episode. Patient noted to be hypotensive and diaphoretic for EMS. Patient feeling improved in the emergency department without any recurrent hypotension. Discussed with medicine regarding admission for observation given unclear cause of prolonged hypotension with diaphoresis and vertiginous symptoms.  Quintella Reichert, MD 09/03/14 [phone]

## 2014-09-02 NOTE — Progress Notes (Signed)
Pt admitted to room 6e06 from Ed, paged Dr. Legrand Rams and pt may have heart healthy diet.

## 2014-09-02 NOTE — H&P (Signed)
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  He was in his normal state of health today when he had [place] food for lunch.  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   He also became diaphoretic.There was a report of 1 synopal episode but no seizure like activity.     No CP, no SOB.  When EMS arrived, his BP Was 80/50.  His HR was in the 60s.  He was given 1L of IVF in route and in the ER.  His symptoms resolved.    Patient says he had abck surgery in March and he has been walking and exercising more.  He has lost weight.  He is still taking his BP Meds.  Denies diarrhea and says he has been drinking fluid.  In the ER, there was concern about a PE as he had recent surgery but d dimer was normal.  Per ER doc, he did not look good when he arrived in the ER but symptoms and appearance have since improved back to his baseline.   Review of Systems:  All systems reviewed, negative unless stated above    Past Medical History  Diagnosis Date  . Hypertension   . Hyperlipidemia   . Hypogonadism male   . Pre-diabetes   . Vertigo   . Sleep apnea     pt does not use CPAP  . GERD (gastroesophageal reflux disease)   . 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 Tinnitus   . PTSD (post-traumatic stress disorder)   . 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 Total hip arthroplasty Right   . Total hip arthroplasty      right   . Kidney surgery      CYST REMOVAL  . Cataract extraction Left   . Bunionectomy Bilateral   . Lumbar laminectomy/decompression microdiscectomy Left 07/31/2014   Procedure: LUMBAR LAMINECTOMY/DECOMPRESSION MICRODISCECTOMY 1 LEVEL L5-S1 ON LEFT;  Surgeon: Susa Day, MD;  Location: [facility];  Service: Orthopedics;  Laterality: Left;   Social History:  reports that he has never smoked. He has never used smokeless tobacco. He reports that he does not drink alcohol or use illicit drugs.  Allergies  Allergen Reactions  . Ziac [Bisoprolol-Hydrochlorothiazide]     Erectile dysfunction    Family History  Problem Relation Age of Onset  . Breast cancer Mother      Prior to Admission medications   Medication Sig Start Date End Date Taking? Authorizing Provider  cetirizine (ZYRTEC) 10 MG tablet Take 1 tablet (10 mg total) by mouth daily. 07/15/14  Yes Unk Pinto, MD  Cholecalciferol (VITAMIN D-3) 5000 UNITS TABS Take 10,000 Units by mouth every other day.    Yes Historical Provider, MD  Cinnamon 500 MG TABS Take 500 mg by mouth daily.   Yes Historical Provider, MD  docusate sodium (COLACE) 100 MG capsule Take 1 capsule (100 mg total) by mouth 2 (two) times daily as needed for mild constipation. 07/31/14  Yes Susa Day, MD  esomeprazole (NEXIUM) 20 MG capsule Take 1 capsule (  20 mg total) by mouth daily as needed. 07/15/14  Yes Unk Pinto, MD  lisinopril-hydrochlorothiazide (PRINZIDE,ZESTORETIC) 20-12.5 MG per tablet TAKE 1 TABLET BY MOUTH ONCE A DAY FOR BLOOD PRESSURE 07/15/14  Yes Unk Pinto, MD  Magnesium 500 MG CAPS Take 500 mg by mouth daily.   Yes Historical Provider, MD  MECLIZINE HCL PO Take by mouth daily as needed.   Yes Historical Provider, MD  tiZANidine (ZANAFLEX) 4 MG capsule Take 1 capsule (4 mg total) by mouth 3 (three) times daily as needed for muscle spasms. 07/31/14  Yes Susa Day, MD  oxyCODONE-acetaminophen (PERCOCET) 5-325 MG per tablet Take 1-2 tablets by mouth every 4 (four) hours as needed. 07/31/14   Susa Day, MD   Physical Exam: Filed Vitals:   09/02/14 1615 09/02/14 1630 09/02/14 1645 09/02/14 1646  BP: 108/75  117/73 130/89 122/78  Pulse: 67 67 61 60  Temp:      TempSrc:      Resp: 15 18 17 17   SpO2: 100% 100% 100% 100%    Wt Readings from Last 3 Encounters:  07/31/14 122.471 kg (270 lb)  07/15/14 108.909 kg (240 lb 1.6 oz)  04/01/14 105.235 kg (232 lb)    General:  Appears calm and comfortable Eyes: PERRL, normal lids, irises & conjunctiva ENT: grossly normal hearing, lips & tongue Neck: no LAD, masses or thyromegaly Cardiovascular: RRR, no m/r/g. No LE edema. Telemetry: SR, no arrhythmias  Respiratory: CTA bilaterally, no w/r/r. Normal respiratory effort. Abdomen: soft, ntnd Skin: no rash or induration seen on limited exam Musculoskeletal: grossly normal tone BUE/BLE Psychiatric: grossly normal mood and affect, speech fluent and appropriate Neurologic: grossly non-focal.          Labs on Admission:  Basic Metabolic Panel:  Recent Labs Lab 09/02/14 1514  NA 139  K 3.7  CL 104  CO2 27  GLUCOSE 121*  BUN 14  CREATININE 1.31  CALCIUM 8.9   Liver Function Tests:  Recent Labs Lab 09/02/14 1514  AST 32  ALT 37  ALKPHOS 50  BILITOT 0.6  PROT 7.0  ALBUMIN 4.1   No results for input(s): LIPASE, AMYLASE in the last 168 hours. No results for input(s): AMMONIA in the last 168 hours. CBC:  Recent Labs Lab 09/02/14 1514  WBC 11.5*  NEUTROABS 9.3*  HGB 14.0  HCT 42.3  MCV 82.6  PLT 205   Cardiac Enzymes: No results for input(s): CKTOTAL, CKMB, CKMBINDEX, TROPONINI in the last 168 hours.  BNP (last 3 results) No results for input(s): BNP in the last 8760 hours.  ProBNP (last 3 results) No results for input(s): PROBNP in the last 8760 hours.  CBG:  Recent Labs Lab 09/02/14 1451  GLUCAP 98    Radiological Exams on Admission: Ct Head Wo Contrast  09/02/2014   CLINICAL DATA:  Syncopal episode. Dizziness. Diaphoretic. 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  EXAM: CT HEAD WITHOUT CONTRAST  TECHNIQUE: Contiguous axial  images were obtained from the base of the skull through the vertex without intravenous contrast.  COMPARISON:  None.  FINDINGS: There is no intra or extra-axial fluid collection or mass lesion. The basilar cisterns and ventricles have a normal appearance. There is no CT evidence for acute infarction or hemorrhage. Bone windows are negative.  IMPRESSION: Negative exam.   Electronically Signed   By: Nolon Nations M.D.   On: 09/02/2014 15:40   Dg Chest Port 1 View  09/02/2014   CLINICAL DATA:  Loss of consciousness.  Hypertension  EXAM: PORTABLE CHEST - 1 VIEW  COMPARISON:  October 24, 2006  FINDINGS: There is no edema or consolidation. Heart is borderline enlarged with pulmonary vascularity within normal limits. No adenopathy. No bone lesions.  IMPRESSION: Borderline cardiac enlargement.  No edema or consolidation.   Electronically Signed   By: Lowella Grip III M.D.   On: 09/02/2014 15:23    EKG: Independently reviewed. Sinus with early repol  Assessment/Plan Active Problems:   Essential hypertension   OSA (obstructive sleep apnea)   Morbid obesity   Syncope   Meniere disease   Syncope- suspect vasovagal from nausea vs orthostatic as dizzy resolved with IVF -cycle CE overnight -tele -d/c in AM? -suspect patient will not need his BP meds at home as he has lost weight and has increased his activity -check echo as heart is boarder-line enlarged  leucocytosis -recheck in AM  OSA -does not wear CPAP  Meniere disease -take allergy medication for  HTN- Hold BP meds and trend BP -improved after IVF    Code Status: full DVT Prophylaxis: Family Communication: family at bedside Disposition Plan:  Time spent: 68 min  Eulogio Bear [facility] Pager [phone]

## 2014-09-02 NOTE — Progress Notes (Signed)
Called Ed for report from [place].

## 2014-09-03 DIAGNOSIS — G4733 Obstructive sleep apnea (adult) (pediatric): Secondary | ICD-10-CM | POA: Diagnosis not present

## 2014-09-03 DIAGNOSIS — I10 Essential (primary) hypertension: Secondary | ICD-10-CM | POA: Diagnosis not present

## 2014-09-03 DIAGNOSIS — R55 Syncope and collapse: Secondary | ICD-10-CM | POA: Diagnosis not present

## 2014-09-03 LAB — BASIC METABOLIC PANEL
Anion gap: 11 (ref 5–15)
BUN: 12 mg/dL (ref 6–23)
CHLORIDE: 104 mmol/L (ref 96–112)
CO2: 24 mmol/L (ref 19–32)
Calcium: 8.9 mg/dL (ref 8.4–10.5)
Creatinine, Ser: 1.15 mg/dL (ref 0.50–1.35)
GFR, EST AFRICAN AMERICAN: 87 mL/min — AB (ref >90)
GFR, EST NON AFRICAN AMERICAN: 75 mL/min — AB (ref >90)
Glucose, Bld: 99 mg/dL (ref 70–99)
Potassium: 3.8 mmol/L (ref 3.5–5.1)
Sodium: 139 mmol/L (ref 135–145)

## 2014-09-03 LAB — CBC
HEMATOCRIT: 41.9 % (ref 39.0–52.0)
HEMOGLOBIN: 13.6 g/dL (ref 13.0–17.0)
MCH: 27.1 pg (ref 26.0–34.0)
MCHC: 32.5 g/dL (ref 30.0–36.0)
MCV: 83.6 fL (ref 78.0–100.0)
Platelets: 223 10*3/uL (ref 150–400)
RBC: 5.01 MIL/uL (ref 4.22–5.81)
RDW: 15.2 % (ref 11.5–15.5)
WBC: 8.4 10*3/uL (ref 4.0–10.5)

## 2014-09-03 LAB — TROPONIN I
Troponin I: <0.03 ng/mL (ref <0.031)
Troponin I: <0.03 ng/mL (ref <0.031)

## 2014-09-03 NOTE — Progress Notes (Signed)
UR completed 

## 2014-09-03 NOTE — Discharge Summary (Signed)
Physician Discharge Summary  Craig [name] LKG:[id number] DOB: [birth year] DOA: 09/02/2014  PCP: Alesia Richards, MD  Admit date: 09/02/2014 Discharge date: 09/03/2014  Time spent: 35 minutes  Recommendations for Outpatient Follow-up:  1. Needs new CPAP machine 2. Outpatient echo 3. To bring BP log into PCP  Discharge Diagnoses:  Active Problems:   Essential hypertension   OSA (obstructive sleep apnea)   Morbid obesity   Syncope   Meniere disease   Discharge Condition: improved  Diet recommendation: cardiac/diabetic  There were no vitals filed for this visit.  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 He was in his normal state of health today when he had [place] food for lunch. 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 He also became diaphoretic.There was a report of 1 synopal episode but no seizure like activity. No CP, no SOB. When EMS arrived, his BP Was 80/50. His HR was in the 60s. He was given 1L of IVF in route and in the ER. His symptoms resolved.   Patient says he had abck surgery in March and he has been walking and exercising more. He has lost weight. He is still taking his BP Meds. Denies diarrhea and says he has been drinking fluid.  In the ER, there was concern about a PE as he had recent surgery but d dimer was normal. Per ER doc, he did not look good when he arrived in the ER but symptoms and appearance have since improved back to his baseline.  Hospital Course:  Symptoms resolved after BP meds d/c'd and about 1L IVF given- outpatient echo, monitor BP at home and bring to PCP- continue weight loss and exercise  Procedures:    Consultations:    Discharge Exam: Filed Vitals:   09/03/14 1015  BP: 128/83  Pulse: 63  Temp: 98.1 F (36.7 C)  Resp: 18      Discharge Instructions   Discharge Instructions    Diet - low sodium heart healthy    Complete by:  As directed   diabetic     Discharge instructions    Complete by:  As directed   Outpatient echo     Increase activity slowly    Complete by:  As directed           Current Discharge Medication List    CONTINUE these medications which have NOT CHANGED   Details  cetirizine (ZYRTEC) 10 MG tablet Take 1 tablet (10 mg total) by mouth daily. Qty: 90 tablet, Refills: 4    Cholecalciferol (VITAMIN D-3) 5000 UNITS TABS Take 10,000 Units by mouth every other day.     Cinnamon 500 MG TABS Take 500 mg by mouth daily.    docusate sodium (COLACE) 100 MG capsule Take 1 capsule (100 mg total) by mouth 2 (two) times daily as needed for mild constipation. Qty: 20 capsule, Refills: 1    esomeprazole (NEXIUM) 20 MG capsule Take 1 capsule (20 mg total) by mouth daily as needed. Qty: 90 capsule, Refills: 4    Magnesium 500 MG CAPS Take 500 mg by mouth daily.    MECLIZINE HCL PO Take by mouth daily as needed.      STOP taking these medications     lisinopril-hydrochlorothiazide (PRINZIDE,ZESTORETIC) 20-12.5 MG per tablet      tiZANidine (ZANAFLEX) 4 MG capsule      oxyCODONE-acetaminophen (PERCOCET) 5-325  MG per tablet        Allergies  Allergen Reactions  . Ziac [Bisoprolol-Hydrochlorothiazide]     Erectile dysfunction   Follow-up Information    Follow up with MCKEOWN,WILLIAM DAVID, MD In 1 week.   Specialty:  Internal Medicine   Why:  patient to bring in log of BPs   Contact information:   [address] [place] [address] [phone]        The results of significant diagnostics from this hospitalization (including imaging, microbiology, ancillary and laboratory) are listed below for reference.    Significant Diagnostic Studies: Ct Head Wo Contrast  09/02/2014   CLINICAL DATA:  Syncopal episode. Dizziness. Diaphoretic. 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  EXAM:  CT HEAD WITHOUT CONTRAST  TECHNIQUE: Contiguous axial images were obtained from the base of the skull through the vertex without intravenous contrast.  COMPARISON:  None.  FINDINGS: There is no intra or extra-axial fluid collection or mass lesion. The basilar cisterns and ventricles have a normal appearance. There is no CT evidence for acute infarction or hemorrhage. Bone windows are negative.  IMPRESSION: Negative exam.   Electronically Signed   By: Nolon Nations M.D.   On: 09/02/2014 15:40   Dg Chest Port 1 View  09/02/2014   CLINICAL DATA:  Loss of consciousness.  Hypertension  EXAM: PORTABLE CHEST - 1 VIEW  COMPARISON:  October 24, 2006  FINDINGS: There is no edema or consolidation. Heart is borderline enlarged with pulmonary vascularity within normal limits. No adenopathy. No bone lesions.  IMPRESSION: Borderline cardiac enlargement.  No edema or consolidation.   Electronically Signed   By: Lowella Grip III M.D.   On: 09/02/2014 15:23    Microbiology: No results found for this or any previous visit (from the past 240 hour(s)).   Labs: Basic Metabolic Panel:  Recent Labs Lab 09/02/14 1514 09/03/14 0630  NA 139 139  K 3.7 3.8  CL 104 104  CO2 27 24  GLUCOSE 121* 99  BUN 14 12  CREATININE 1.31 1.15  CALCIUM 8.9 8.9   Liver Function Tests:  Recent Labs Lab 09/02/14 1514  AST 32  ALT 37  ALKPHOS 50  BILITOT 0.6  PROT 7.0  ALBUMIN 4.1   No results for input(s): LIPASE, AMYLASE in the last 168 hours. No results for input(s): AMMONIA in the last 168 hours. CBC:  Recent Labs Lab 09/02/14 1514 09/03/14 0630  WBC 11.5* 8.4  NEUTROABS 9.3*  --   HGB 14.0 13.6  HCT 42.3 41.9  MCV 82.6 83.6  PLT 205 223   Cardiac Enzymes:  Recent Labs Lab 09/02/14 2008 09/03/14 0015 09/03/14 0630  TROPONINI <0.03 <0.03 <0.03   BNP: BNP (last 3 results) No results for input(s): BNP in the last 8760 hours.  ProBNP (last 3 results) No results for input(s): PROBNP in the last  8760 hours.  CBG:  Recent Labs Lab 09/02/14 1451  GLUCAP 98       Signed:  Eryca Bolte  [facility] 09/03/2014, 11:53 AM

## 2014-09-04 ENCOUNTER — Telehealth: Payer: Self-pay | Admitting: *Deleted

## 2014-09-04 NOTE — Telephone Encounter (Signed)
Patient called and states his BP became low at work and he fainted.  Patient was transported to the ER ,where he was told to stop his Lisinopril/HCTZ.  Per Dr Melford Aase, continue to hold the BP med and monitor BP and restart med at 1/2 tab if BP gets above 150/90.  Patient came by the office and was informed of the advise.

## 2014-11-05 ENCOUNTER — Ambulatory Visit (INDEPENDENT_AMBULATORY_CARE_PROVIDER_SITE_OTHER): Payer: 59 | Admitting: Physician Assistant

## 2014-11-05 ENCOUNTER — Encounter: Payer: Self-pay | Admitting: Physician Assistant

## 2014-11-05 VITALS — BP 138/96 | HR 78 | Temp 98.2°F | Resp 18 | Ht 73.0 in

## 2014-11-05 DIAGNOSIS — R7303 Prediabetes: Secondary | ICD-10-CM

## 2014-11-05 DIAGNOSIS — E782 Mixed hyperlipidemia: Secondary | ICD-10-CM

## 2014-11-05 DIAGNOSIS — Z79899 Other long term (current) drug therapy: Secondary | ICD-10-CM

## 2014-11-05 DIAGNOSIS — E559 Vitamin D deficiency, unspecified: Secondary | ICD-10-CM

## 2014-11-05 DIAGNOSIS — I10 Essential (primary) hypertension: Secondary | ICD-10-CM

## 2014-11-05 DIAGNOSIS — R55 Syncope and collapse: Secondary | ICD-10-CM

## 2014-11-05 DIAGNOSIS — R7309 Other abnormal glucose: Secondary | ICD-10-CM

## 2014-11-05 DIAGNOSIS — G4733 Obstructive sleep apnea (adult) (pediatric): Secondary | ICD-10-CM

## 2014-11-05 MED ORDER — METHOCARBAMOL 500 MG PO TABS: 500.0000 mg | ORAL_TABLET | Freq: Three times a day (TID) | ORAL | Status: DC | PRN

## 2014-11-05 MED ORDER — LISINOPRIL 20 MG PO TABS: 20.0000 mg | ORAL_TABLET | Freq: Every day | ORAL | Status: DC

## 2014-11-05 NOTE — Patient Instructions (Addendum)
Add ENTERIC COATED low dose 81 mg Aspirin daily OR can do every other day if you have easy bruising to protect your heart and head. As well as to reduce risk of Colon Cancer by 20 %, Skin Cancer by 26 % , Melanoma by 46% and Pancreatic cancer by 60%  Diabetes is a very complicated disease...lets simplify it.  An easy way to look at it to understand the complications is if you think of the extra sugar floating in your blood stream as glass shards floating through your blood stream.    Diabetes affects your small vessels first: 1) The glass shards (sugar) scraps down the tiny blood vessels in your eyes and lead to diabetic retinopathy, the leading cause of blindness in the [place]. Diabetes is the leading cause of newly diagnosed adult (20 to 47 years of age) blindness in the [place].  2) The glass shards scratches down the tiny vessels of your legs leading to nerve damage called neuropathy and can lead to amputations of your feet. More than 60% of all non-traumatic amputations of lower limbs occur in people with diabetes.  3) Over time the small vessels in your brain are shredded and closed off, individually this does not cause any problems but over a long period of time many of the small vessels being blocked can lead to Vascular Dementia.   4) Your kidney's are a filter system and have a "net" that keeps certain things in the body and lets bad things out. Sugar shreds this net and leads to kidney damage and eventually failure. Decreasing the sugar that is destroying the net and certain blood pressure medications can help stop or decrease progression of kidney disease. Diabetes was the primary cause of kidney failure in 44 percent of all new cases in 2011.  5) Diabetes also destroys the small vessels in your penis that lead to erectile dysfunction. Eventually the vessels are so damaged that you may not be responsive to cialis or viagra.   Diabetes and your large vessels: Your larger vessels  consist of your coronary arteries in your heart and the carotid vessels to your brain. Diabetes or even increased sugars put you at 300% increased risk of heart attack and stroke and this is why.. The sugar scrapes down your large blood vessels and your body sees this as an internal injury and tries to repair itself. Just like you get a scab on your skin, your platelets will stick to the blood vessel wall trying to heal it. This is why we have diabetics on low dose aspirin daily, this prevents the platelets from sticking and can prevent plaque formation. In addition, your body takes cholesterol and tries to shove it into the open wound. This is why we want your LDL, or bad cholesterol, below 70.   The combination of platelets and cholesterol over 5-10 years forms plaque that can break off and cause a heart attack or stroke.   PLEASE REMEMBER:  Diabetes is preventable! Up to 85 percent of complications and morbidities among individuals with type 2 diabetes can be prevented, delayed, or effectively treated and minimized with regular visits to a health professional, appropriate monitoring and medication, and a healthy diet and lifestyle.     Bad carbs also include fruit juice, alcohol, and sweet tea. These are empty calories that do not signal to your brain that you are full.   Please remember the good carbs are still carbs which convert into sugar. So please measure them out   no more than 1/2-1 cup of rice, oatmeal, pasta, and beans  Veggies are however free foods! Pile them on.   Not all fruit is created equal. Please see the list below, the fruit at the bottom is higher in sugars than the fruit at the top. Please avoid all dried fruits.     

## 2014-11-05 NOTE — Progress Notes (Signed)
Assessment and Plan:  Hypertension: Continue medication, monitor blood pressure at home. Continue DASH diet.  Reminder to go to the ER if any CP, SOB, nausea, dizziness, severe HA, changes vision/speech, left arm numbness and tingling, and jaw pain. Cholesterol: Continue diet and exercise. Check cholesterol.  Pre-diabetes-Continue diet and exercise. Check A1C Vitamin D Def- check level and continue medications.  Obesity with co morbidities- long discussion about weight loss, diet, and exercise Back pain- will prescribe, robaxin.  OSA- needs to get on CPAP, [facility].  Syncopal episode- normal CT head, non smoker, negative EKG/troponin's, ? Versus Tanzanidine- stop this med,  Needs to get CPAP, has been exercising without syncope, dizziness, SOB, CP, was likely due to hypotension, will monitor closely.   HPI 49 y.o. male  presents for 3 month follow up with hypertension, hyperlipidemia, prediabetes and vitamin D. His blood pressure has been controlled at home, today their BP is BP: (!) 138/96 mmHg He does workout, walking 4-5 miles. He denies chest pain, shortness of breath, dizziness.  He is not on cholesterol medication and denies myalgias. His cholesterol is at goal. The cholesterol last visit was:   Lab Results  Component Value Date   CHOL 173 07/15/2014   HDL 36* 07/15/2014   LDLCALC 96 07/15/2014   TRIG 205* 07/15/2014   CHOLHDL 4.8 07/15/2014   He has been working on diet and exercise for diabetes, he is not on bASA, he is not on ACE, he is on cinnamon once daily and denies paresthesia of the feet, polydipsia, polyuria and visual disturbances. Last A1C in the office was:  Lab Results  Component Value Date   HGBA1C 6.7* 07/15/2014   Patient is on Vitamin D supplement.   Lab Results  Component Value Date   VD25OH 86 07/15/2014     BMI is not calculated, he is wearing his guns/gear today and declines weight, states at home he is at 230. , he is working on diet and  exercise. He has OSA.  Wt Readings from Last 3 Encounters:  07/31/14 270 lb (122.471 kg)  07/24/14 270 lb (122.471 kg)  07/15/14 240 lb 1.6 oz (108.909 kg)   Patient had lumbar laminectomy with Dr. Dellis Filbert in on March 9th 2016.  He then went to the hospital 09/02/2014 for a pre-syncopal event after eating chinese food with dizziness, nausea, and diaphoresis. He had 1 L of fluid and BP meds were discontinued. He had negative cardiac enzymes, Ddimer. They did suggest an outpatient echo and new CPAP machine. He had borderline cardiomegaly on CXR, normal CT head. He has been on tizanidine for his back.  Patient states that prior to presyncopal event he was still taking his BP meds while walking 5 miles daily after his lumbar surgery and thinks it was due to dehydration/BP meds. He is off his meds/takes sporadically, has been walking 2-5 miles daily and has not been having any more syncopal episodes.  He did see vertigo specialist in [facility] for his Meniere's and is being treated with valium PRN.    Current Medications:  Current Outpatient Prescriptions on File Prior to Visit  Medication Sig Dispense Refill  . cetirizine (ZYRTEC) 10 MG tablet Take 1 tablet (10 mg total) by mouth daily. 90 tablet 4  . Cholecalciferol (VITAMIN D-3) 5000 UNITS TABS Take 10,000 Units by mouth every other day.     . Cinnamon 500 MG TABS Take 500 mg by mouth daily.    . Magnesium 500 MG CAPS  Take 500 mg by mouth daily.     No current facility-administered medications on file prior to visit.   Medical History:  Past Medical History  Diagnosis Date  . Hypertension   . Hyperlipidemia   . Hypogonadism male   . Pre-diabetes   . Vertigo   . Sleep apnea     pt does not use CPAP  . GERD (gastroesophageal reflux disease)   . 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 Tinnitus   . PTSD (post-traumatic stress disorder)   . Eye abnormalities     pt states has had twice that the right eye  has come out of socket    Allergies:  Allergies  Allergen Reactions  . Ziac [Bisoprolol-Hydrochlorothiazide]     Erectile dysfunction   Review of Systems  Constitutional: Negative.   HENT: Negative.   Respiratory: Negative.   Cardiovascular: Negative.   Gastrointestinal: Negative.   Genitourinary: Negative.   Musculoskeletal: Positive for back pain (better with walking). Negative for myalgias, joint pain, falls and neck pain.  Skin: Negative.   Neurological: Negative.   Psychiatric/Behavioral: Negative.      Family history- Review and unchanged Social history- Review and unchanged Physical Exam: BP 138/96 mmHg  Pulse 78  Temp(Src) 98.2 F (36.8 C) (Temporal)  Resp 18  Ht 6\' 1"  (1.854 m) Wt Readings from Last 3 Encounters:  07/31/14 270 lb (122.471 kg)  07/24/14 270 lb (122.471 kg)  07/15/14 240 lb 1.6 oz (108.909 kg)   General Appearance: Well nourished, in no apparent distress. Eyes: PERRLA, EOMs, conjunctiva no swelling or erythema Sinuses: No Frontal/maxillary tenderness ENT/Mouth: Ext aud canals clear, TMs without erythema. No erythema, swelling, or exudate on post pharynx.  Tonsils not swollen or erythematous.  Neck: Supple, thyroid normal.  Respiratory: Respiratory effort normal, BS equal bilaterally without rales, rhonchi, wheezing or stridor.  Cardio: RRR with no MRGs. Brisk peripheral pulses without edema.  Abdomen: Soft, + BS.  Non tender, no guarding, rebound, hernias, masses. Lymphatics: Non tender without lymphadenopathy.  Musculoskeletal: Full ROM, 5/5 strength, normal gait.  Skin: Warm, dry without rashes, lesions, ecchymosis.  Neuro: Cranial nerves intact. Normal muscle tone, no cerebellar symptoms. Sensation intact.  Psych: Awake and oriented X 3, normal affect, Insight and Judgment appropriate.    Vicie Mutters, PA-C 4:37 PM [facility]

## 2014-11-06 LAB — CBC WITH DIFFERENTIAL/PLATELET
BASOS ABS: 0 10*3/uL (ref 0.0–0.1)
BASOS PCT: 0 % (ref 0–1)
EOS ABS: 0.1 10*3/uL (ref 0.0–0.7)
Eosinophils Relative: 2 % (ref 0–5)
HEMATOCRIT: 44.1 % (ref 39.0–52.0)
HEMOGLOBIN: 14.8 g/dL (ref 13.0–17.0)
Lymphocytes Relative: 35 % (ref 12–46)
Lymphs Abs: 2.6 10*3/uL (ref 0.7–4.0)
MCH: 27.2 pg (ref 26.0–34.0)
MCHC: 33.6 g/dL (ref 30.0–36.0)
MCV: 80.9 fL (ref 78.0–100.0)
MPV: 10.5 fL (ref 8.6–12.4)
Monocytes Absolute: 0.7 10*3/uL (ref 0.1–1.0)
Monocytes Relative: 10 % (ref 3–12)
NEUTROS PCT: 53 % (ref 43–77)
Neutro Abs: 3.9 10*3/uL (ref 1.7–7.7)
PLATELETS: 257 10*3/uL (ref 150–400)
RBC: 5.45 MIL/uL (ref 4.22–5.81)
RDW: 15.5 % (ref 11.5–15.5)
WBC: 7.4 10*3/uL (ref 4.0–10.5)

## 2014-11-06 LAB — HEMOGLOBIN A1C
Hgb A1c MFr Bld: 6 % — ABNORMAL HIGH (ref <5.7)
Mean Plasma Glucose: 126 mg/dL — ABNORMAL HIGH (ref <117)

## 2014-11-06 LAB — HEPATIC FUNCTION PANEL
ALT: 29 U/L (ref 0–53)
AST: 27 U/L (ref 0–37)
Albumin: 4.5 g/dL (ref 3.5–5.2)
Alkaline Phosphatase: 53 U/L (ref 39–117)
BILIRUBIN DIRECT: 0.1 mg/dL (ref 0.0–0.3)
BILIRUBIN TOTAL: 0.4 mg/dL (ref 0.2–1.2)
Indirect Bilirubin: 0.3 mg/dL (ref 0.2–1.2)
Total Protein: 7.3 g/dL (ref 6.0–8.3)

## 2014-11-06 LAB — BASIC METABOLIC PANEL WITH GFR
BUN: 16 mg/dL (ref 6–23)
CO2: 24 mEq/L (ref 19–32)
Calcium: 9.7 mg/dL (ref 8.4–10.5)
Chloride: 101 mEq/L (ref 96–112)
Creat: 1.25 mg/dL (ref 0.50–1.35)
GFR, EST AFRICAN AMERICAN: 79 mL/min
GFR, Est Non African American: 69 mL/min
GLUCOSE: 90 mg/dL (ref 70–99)
POTASSIUM: 3.8 meq/L (ref 3.5–5.3)
Sodium: 138 mEq/L (ref 135–145)

## 2014-11-06 LAB — MAGNESIUM: Magnesium: 1.9 mg/dL (ref 1.5–2.5)

## 2014-11-06 LAB — LIPID PANEL
CHOLESTEROL: 163 mg/dL (ref 0–200)
HDL: 37 mg/dL — AB (ref >=40)
LDL CALC: 102 mg/dL — AB (ref 0–99)
Total CHOL/HDL Ratio: 4.4 Ratio
Triglycerides: 121 mg/dL (ref <150)
VLDL: 24 mg/dL (ref 0–40)

## 2014-11-06 LAB — TSH: TSH: 1.624 u[IU]/mL (ref 0.350–4.500)

## 2014-11-06 LAB — VITAMIN D 25 HYDROXY (VIT D DEFICIENCY, FRACTURES): Vit D, 25-Hydroxy: 65 ng/mL (ref 30–100)

## 2014-11-06 LAB — INSULIN, FASTING: Insulin fasting, serum: 36.6 u[IU]/mL — ABNORMAL HIGH (ref 2.0–19.6)

## 2014-12-05 ENCOUNTER — Encounter: Payer: Self-pay | Admitting: Internal Medicine

## 2014-12-19 ENCOUNTER — Encounter: Payer: Self-pay | Admitting: Internal Medicine

## 2015-02-11 ENCOUNTER — Encounter: Payer: Self-pay | Admitting: Internal Medicine

## 2015-02-11 ENCOUNTER — Ambulatory Visit (INDEPENDENT_AMBULATORY_CARE_PROVIDER_SITE_OTHER): Payer: 59 | Admitting: Internal Medicine

## 2015-02-11 VITALS — BP 116/82 | HR 64 | Temp 97.2°F | Resp 16 | Ht 73.0 in | Wt 259.8 lb

## 2015-02-11 DIAGNOSIS — Z111 Encounter for screening for respiratory tuberculosis: Secondary | ICD-10-CM | POA: Diagnosis not present

## 2015-02-11 DIAGNOSIS — Z0001 Encounter for general adult medical examination with abnormal findings: Secondary | ICD-10-CM | POA: Diagnosis not present

## 2015-02-11 DIAGNOSIS — E782 Mixed hyperlipidemia: Secondary | ICD-10-CM

## 2015-02-11 DIAGNOSIS — K2100 Gastro-esophageal reflux disease with esophagitis, without bleeding: Secondary | ICD-10-CM

## 2015-02-11 DIAGNOSIS — Z1212 Encounter for screening for malignant neoplasm of rectum: Secondary | ICD-10-CM

## 2015-02-11 DIAGNOSIS — Z79899 Other long term (current) drug therapy: Secondary | ICD-10-CM

## 2015-02-11 DIAGNOSIS — R7309 Other abnormal glucose: Secondary | ICD-10-CM | POA: Diagnosis not present

## 2015-02-11 DIAGNOSIS — R6889 Other general symptoms and signs: Secondary | ICD-10-CM | POA: Diagnosis not present

## 2015-02-11 DIAGNOSIS — I10 Essential (primary) hypertension: Secondary | ICD-10-CM

## 2015-02-11 DIAGNOSIS — R7303 Prediabetes: Secondary | ICD-10-CM

## 2015-02-11 DIAGNOSIS — Z125 Encounter for screening for malignant neoplasm of prostate: Secondary | ICD-10-CM

## 2015-02-11 DIAGNOSIS — R5383 Other fatigue: Secondary | ICD-10-CM

## 2015-02-11 DIAGNOSIS — G4733 Obstructive sleep apnea (adult) (pediatric): Secondary | ICD-10-CM

## 2015-02-11 DIAGNOSIS — Z6831 Body mass index (BMI) 31.0-31.9, adult: Secondary | ICD-10-CM

## 2015-02-11 DIAGNOSIS — E559 Vitamin D deficiency, unspecified: Secondary | ICD-10-CM

## 2015-02-11 NOTE — Patient Instructions (Signed)

## 2015-02-11 NOTE — Progress Notes (Signed)
Patient ID: Craig [name], male   DOB: Jan 18, 1966, 49 y.o.   MRN: [id number]   Comprehensive Examination     This very nice 49 y.o. MBM presents for complete physical.  Patient has been followed for HTN, Prediabetes, Hyperlipidemia, Testosterone and Vitamin D Deficiency. Patient also relates a dx/o PTSD from his [place] experiences with chronic intermittent anxiety and poor sleep hygiene.      HTN predates since  July 2013. Patient's BP has been controlled at home.Today's BP: 116/82 mmHg. Patient denies any cardiac symptoms as chest pain, palpitations, shortness of breath, dizziness or ankle swelling.     Patient's hyperlipidemia is controlled with diet and medications. Patient denies myalgias or other medication SE's. Last lipids were near goal - Cholesterol 163; HDL 37*; LDL 102*; Triglycerides 121 on 11/05/2014.     Patient has Morbid Obesity (BMI 31.68) and consequent  prediabetes since July 2013 with A1c 5.9% and patient denies reactive hypoglycemic symptoms, visual blurring, diabetic polys or paresthesias. Last A1c was 6.0% on 11/05/2014.     Patient also has hx/o Low T of 171.32 in Dec 2013. He does report issues with ED/performance.Finally, patient has history of Vitamin D Deficiency of 16 in July 2013 and last vitamin D was 65 on 11/05/2014.   Medication Sig  . cetirizine  10 MG  Take 1 tablet (10 mg total) by mouth daily.  Marland Kitchen VITAMIN D  5000 UNITS Take 10,000 Units by mouth every other day.   . Cinnamon 500 MG  Take 500 mg by mouth daily.  Marland Kitchen lisinopril  20 MG  Take 1 tablet (20 mg total) by mouth daily.  . Magnesium 500 MG  Take 500 mg by mouth daily.  . methocarbamol (ROBAXIN) 500 MG  Take 1 tablet (500 mg total) by mouth every 8 (eight) hours as needed for muscle spasms.  .  OTC Acid Reducer as needed.   Allergies  Allergen Reactions  . Ziac [Bisoprolol-Hydrochlorothiazide]     Erectile dysfunction   Past Medical History  Diagnosis Date  . Hypertension   . Hyperlipidemia   .  Hypogonadism male   . Pre-diabetes   . Vertigo   . Sleep apnea     pt does not use CPAP  . GERD (gastroesophageal reflux disease)   . 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 Tinnitus   . PTSD (post-traumatic stress disorder)   . Eye abnormalities     pt states has had twice that the right eye has come out of socket    Health Maintenance  Topic Date Due  . TETANUS/TDAP  11/20/1986  . INFLUENZA VACCINE  12/23/2014  . HIV Screening  Completed   Immunization History  Administered Date(s) Administered  . PPD Test 12/18/2013, 02/11/2015  . Td 05/24/2010   Past Surgical History  Procedure Laterality Date  . Total hip arthroplasty Right 2007  . Kidney cyst removal  2008  . Cataract extraction w/ intraocular lens implant Left 2014  . Bunionectomy Bilateral   . Lumbar laminectomy/decompression microdiscectomy Left 07/31/2014    Procedure: LUMBAR LAMINECTOMY/DECOMPRESSION MICRODISCECTOMY 1 LEVEL L5-S1 ON LEFT;  Surgeon: Susa Day, MD;  Location: [facility];  Service: Orthopedics;  Laterality: Left;  . Joint replacement    . Back surgery     Family History  Problem Relation Age of Onset  . Breast cancer Mother    Social History   Social History  . Marital Status: Married  Spouse Name: N/A  . Number of Children: N/A  . Years of Education: N/A   Occupational History  . 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 Smoking status: Never Smoker   . Smokeless tobacco: Never Used  . Alcohol Use: No  . Drug Use: No  . Sexual Activity: Active     ROS Constitutional: Denies fever, chills, weight loss/gain, headaches, insomnia,  night sweats or change in appetite. Does c/o fatigue. Eyes: Denies redness, blurred vision, diplopia, discharge, itchy or watery eyes.  ENT: Denies discharge, congestion, post nasal drip, epistaxis, sore throat, earache, hearing loss, dental pain, Tinnitus, Vertigo, Sinus pain or snoring.  Cardio: Denies  chest pain, palpitations, irregular heartbeat, syncope, dyspnea, diaphoresis, orthopnea, PND, claudication or edema Respiratory: denies cough, dyspnea, DOE, pleurisy, hoarseness, laryngitis or wheezing.  Gastrointestinal: Denies dysphagia, heartburn, reflux, water brash, pain, cramps, nausea, vomiting, bloating, diarrhea, constipation, hematemesis, melena, hematochezia, jaundice or hemorrhoids Genitourinary: Denies dysuria, frequency, urgency, nocturia, hesitancy, discharge, hematuria or flank pain Musculoskeletal: Denies arthralgia, myalgia, stiffness, Jt. Swelling, pain, limp or strain/sprain. Denies Falls. Skin: Denies puritis, rash, hives, warts, acne, eczema or change in skin lesion Neuro: No weakness, tremor, incoordination, spasms, paresthesia or pain Psychiatric: Denies confusion, memory loss or sensory loss. Denies Depression. Endocrine: Denies change in weight, skin, hair change, nocturia, and paresthesia, diabetic polys, visual blurring or hyper / hypo glycemic episodes.  Heme/Lymph: No excessive bleeding, bruising or enlarged lymph nodes.               Physical Exam  BP 116/82 mmHg  Pulse 64  Temp(Src) 97.2 F (36.2 C)  Resp 16  Ht 6\' 1"  (1.854 m)  Wt 259 lb 12.8 oz (117.845 kg)  BMI 34.28 kg/m2  General Appearance: Well nourished &  in no apparent distress. Eyes: PERRLA, EOMs, conjunctiva no swelling or erythema, normal fundi and vessels. Sinuses: No frontal/maxillary tenderness ENT/Mouth: EACs patent / TMs  nl. Nares clear without erythema, swelling, mucoid exudates. Oral hygiene is good. No erythema, swelling, or exudate. Tongue normal, non-obstructing. Tonsils not swollen or erythematous. Hearing normal.  Neck: Supple, thyroid normal. No bruits, nodes or JVD. Respiratory: Respiratory effort normal.  BS equal and clear bilateral without rales, rhonci, wheezing or stridor. Cardio: Heart sounds are normal with regular rate and rhythm and no murmurs, rubs or gallops. Peripheral  pulses are normal and equal bilaterally without edema. No aortic or femoral bruits. Chest: symmetric with normal excursions and percussion.  Abdomen: Flat, soft, with bowel sounds. Nontender, no guarding, rebound, hernias, masses, or organomegaly.  Lymphatics: Non tender without lymphadenopathy.  Genitourinary: No hernias.Testes nl. DRE - prostate nl for age - smooth & firm w/o nodules. Musculoskeletal: Full ROM all peripheral extremities, joint stability, 5/5 strength, and normal gait. Skin: Warm and dry without rashes, lesions, cyanosis, clubbing or  ecchymosis.  Neuro: Cranial nerves intact, reflexes equal bilaterally. Normal muscle tone, no cerebellar symptoms. Sensation intact.  Pysch: Alert and oriented X 3 with normal affect, insight and judgment appropriate.   Assessment and Plan  1. Encounter for general adult medical examination with abnormal findings   2. Essential hypertension  - Microalbumin / creatinine urine ratio - EKG 12-Lead - [place], RETROPERITNL ABD,  LTD - TSH  3. Hyperlipidemia  - Lipid panel  4. Prediabetes  - Hemoglobin A1c - Insulin, random  5. Vitamin D deficiency  - Vit D  25 hydroxy (  6. BMI 31.0-31.9,adult   7. Morbid obesity (BMI 31.68)  8. GERD   9. OSA (obstructive sleep apnea)   10. Screening for rectal cancer  - POC Hemoccult Bld/Stl  11. Prostate cancer screening  - PSA  12. Other fatigue  - Vitamin B12 - Testosterone - Iron and TIBC - TSH  13. Screening examination for pulmonary tuberculosis  - PPD  14. Medication management  - Urinalysis, Routine w reflex microscopic  - CBC with Differential/Platelet - BASIC METABOLIC PANEL WITH GFR - Hepatic function panel - Magnesium   Continue prudent diet as discussed, weight control, BP monitoring, regular exercise, and medications as discussed.  Discussed med effects and SE's. Routine screening labs and tests as requested with regular follow-up as recommended.  Over 40  minutes of exam, counseling &  chart review was performed

## 2015-02-12 LAB — BASIC METABOLIC PANEL WITH GFR
BUN: 12 mg/dL (ref 7–25)
CHLORIDE: 103 mmol/L (ref 98–110)
CO2: 25 mmol/L (ref 20–31)
Calcium: 9.4 mg/dL (ref 8.6–10.3)
Creat: 1.18 mg/dL (ref 0.60–1.35)
GFR, EST AFRICAN AMERICAN: 84 mL/min (ref >=60)
GFR, EST NON AFRICAN AMERICAN: 73 mL/min (ref >=60)
Glucose, Bld: 89 mg/dL (ref 65–99)
POTASSIUM: 3.9 mmol/L (ref 3.5–5.3)
Sodium: 138 mmol/L (ref 135–146)

## 2015-02-12 LAB — HEPATIC FUNCTION PANEL
ALK PHOS: 52 U/L (ref 40–115)
ALT: 33 U/L (ref 9–46)
AST: 33 U/L (ref 10–40)
Albumin: 4.3 g/dL (ref 3.6–5.1)
BILIRUBIN INDIRECT: 0.3 mg/dL (ref 0.2–1.2)
BILIRUBIN TOTAL: 0.4 mg/dL (ref 0.2–1.2)
Bilirubin, Direct: 0.1 mg/dL (ref <=0.2)
Total Protein: 7 g/dL (ref 6.1–8.1)

## 2015-02-12 LAB — CBC WITH DIFFERENTIAL/PLATELET
BASOS ABS: 0 10*3/uL (ref 0.0–0.1)
Basophils Relative: 0 % (ref 0–1)
EOS ABS: 0.2 10*3/uL (ref 0.0–0.7)
EOS PCT: 2 % (ref 0–5)
HEMATOCRIT: 42.7 % (ref 39.0–52.0)
Hemoglobin: 14.3 g/dL (ref 13.0–17.0)
LYMPHS ABS: 2.7 10*3/uL (ref 0.7–4.0)
LYMPHS PCT: 35 % (ref 12–46)
MCH: 26.9 pg (ref 26.0–34.0)
MCHC: 33.5 g/dL (ref 30.0–36.0)
MCV: 80.3 fL (ref 78.0–100.0)
MPV: 11.4 fL (ref 8.6–12.4)
Monocytes Absolute: 0.7 10*3/uL (ref 0.1–1.0)
Monocytes Relative: 9 % (ref 3–12)
NEUTROS PCT: 54 % (ref 43–77)
Neutro Abs: 4.2 10*3/uL (ref 1.7–7.7)
PLATELETS: 265 10*3/uL (ref 150–400)
RBC: 5.32 MIL/uL (ref 4.22–5.81)
RDW: 15.9 % — AB (ref 11.5–15.5)
WBC: 7.8 10*3/uL (ref 4.0–10.5)

## 2015-02-12 LAB — IRON AND TIBC
%SAT: 23 % (ref 15–60)
IRON: 69 ug/dL (ref 50–180)
TIBC: 299 ug/dL (ref 250–425)
UIBC: 230 ug/dL (ref 125–400)

## 2015-02-12 LAB — MICROALBUMIN / CREATININE URINE RATIO
Creatinine, Urine: 291.8 mg/dL
MICROALB UR: 0.7 mg/dL (ref <2.0)
Microalb Creat Ratio: 2.4 mg/g (ref 0.0–30.0)

## 2015-02-12 LAB — HEMOGLOBIN A1C
Hgb A1c MFr Bld: 6.4 % — ABNORMAL HIGH (ref <5.7)
Mean Plasma Glucose: 137 mg/dL — ABNORMAL HIGH (ref <117)

## 2015-02-12 LAB — URINALYSIS, ROUTINE W REFLEX MICROSCOPIC
Bilirubin Urine: NEGATIVE
Glucose, UA: NEGATIVE
Hgb urine dipstick: NEGATIVE
Ketones, ur: NEGATIVE
LEUKOCYTES UA: NEGATIVE
NITRITE: NEGATIVE
PH: 5.5 (ref 5.0–8.0)
Protein, ur: NEGATIVE
SPECIFIC GRAVITY, URINE: 1.023 (ref 1.001–1.035)

## 2015-02-12 LAB — LIPID PANEL
CHOL/HDL RATIO: 4.7 ratio (ref <=5.0)
Cholesterol: 155 mg/dL (ref 125–200)
HDL: 33 mg/dL — AB (ref >=40)
LDL CALC: 77 mg/dL (ref <130)
TRIGLYCERIDES: 226 mg/dL — AB (ref <150)
VLDL: 45 mg/dL — AB (ref <30)

## 2015-02-12 LAB — TSH: TSH: 1.91 u[IU]/mL (ref 0.350–4.500)

## 2015-02-12 LAB — MAGNESIUM: Magnesium: 1.8 mg/dL (ref 1.5–2.5)

## 2015-02-12 LAB — VITAMIN D 25 HYDROXY (VIT D DEFICIENCY, FRACTURES): Vit D, 25-Hydroxy: 71 ng/mL (ref 30–100)

## 2015-02-12 LAB — PSA: PSA: 0.77 ng/mL (ref <=4.00)

## 2015-02-12 LAB — VITAMIN B12: Vitamin B-12: 295 pg/mL (ref 211–911)

## 2015-02-12 LAB — TESTOSTERONE: Testosterone: 277 ng/dL — ABNORMAL LOW (ref 300–890)

## 2015-02-12 LAB — INSULIN, RANDOM: Insulin: 33.3 u[IU]/mL — ABNORMAL HIGH (ref 2.0–19.6)

## 2015-02-18 ENCOUNTER — Other Ambulatory Visit: Payer: Self-pay | Admitting: *Deleted

## 2015-02-18 ENCOUNTER — Ambulatory Visit (INDEPENDENT_AMBULATORY_CARE_PROVIDER_SITE_OTHER): Payer: 59 | Admitting: *Deleted

## 2015-02-18 ENCOUNTER — Telehealth: Payer: Self-pay | Admitting: *Deleted

## 2015-02-18 DIAGNOSIS — E291 Testicular hypofunction: Secondary | ICD-10-CM

## 2015-02-18 LAB — TB SKIN TEST
Induration: 0 mm
TB Skin Test: NEGATIVE

## 2015-02-18 MED ORDER — TESTOSTERONE CYPIONATE 200 MG/ML IM SOLN: INTRAMUSCULAR | Status: DC

## 2015-02-18 MED ORDER — TESTOSTERONE CYPIONATE 200 MG/ML IM SOLN
300.0000 mg | Freq: Once | INTRAMUSCULAR | Status: AC
  Administered 2015-02-18: 300 mg via INTRAMUSCULAR

## 2015-02-18 NOTE — Progress Notes (Signed)
Patient ID: Craig [name], male   DOB: [birth year], 49 y.o.   MRN: [id number] Patient presents for testosterone injection for hypogonadism Tx.  Patient received 1.5 cc IM right glute and tolerated well. Patient wants to return in 3 weeks for next shot.

## 2015-02-18 NOTE — Telephone Encounter (Signed)
Pt aware of lab results.  RX Testosterone Cypionate called in to [facility].  The patient will start with 1 1/2 ml IM every 3 weeks and check testosterone level prior to third injection.

## 2015-03-04 ENCOUNTER — Ambulatory Visit: Payer: Self-pay

## 2015-03-11 ENCOUNTER — Ambulatory Visit (INDEPENDENT_AMBULATORY_CARE_PROVIDER_SITE_OTHER): Payer: 59

## 2015-03-11 DIAGNOSIS — E291 Testicular hypofunction: Secondary | ICD-10-CM | POA: Diagnosis not present

## 2015-03-11 MED ORDER — TESTOSTERONE CYPIONATE 200 MG/ML IM SOLN: 400.0000 mg | Freq: Once | INTRAMUSCULAR | Status: DC

## 2015-03-11 MED ORDER — TESTOSTERONE CYPIONATE 200 MG/ML IM SOLN
300.0000 mg | Freq: Once | INTRAMUSCULAR | Status: AC
  Administered 2015-03-11: 300 mg via INTRAMUSCULAR

## 2015-03-11 NOTE — Progress Notes (Signed)
Patient presents for testosterone injection for hypogonadism Tx. Patient received 1.5 cc IM leftt glute and tolerated well.

## 2015-03-25 ENCOUNTER — Ambulatory Visit: Payer: Self-pay

## 2015-04-07 ENCOUNTER — Other Ambulatory Visit: Payer: Self-pay | Admitting: *Deleted

## 2015-04-07 DIAGNOSIS — Z1212 Encounter for screening for malignant neoplasm of rectum: Secondary | ICD-10-CM

## 2015-04-07 LAB — POC HEMOCCULT BLD/STL (HOME/3-CARD/SCREEN)
Card #2 Fecal Occult Blod, POC: NEGATIVE
Card #3 Fecal Occult Blood, POC: NEGATIVE
Fecal Occult Blood, POC: NEGATIVE

## 2015-05-05 IMAGING — CR DG CHEST 1V PORT
1 series · 1 of 1 positions shown · non-contrast
Comparison: October 24, 2006

CLINICAL DATA: Loss of consciousness.  Hypertension

EXAM:
PORTABLE CHEST - 1 VIEW

[AP]
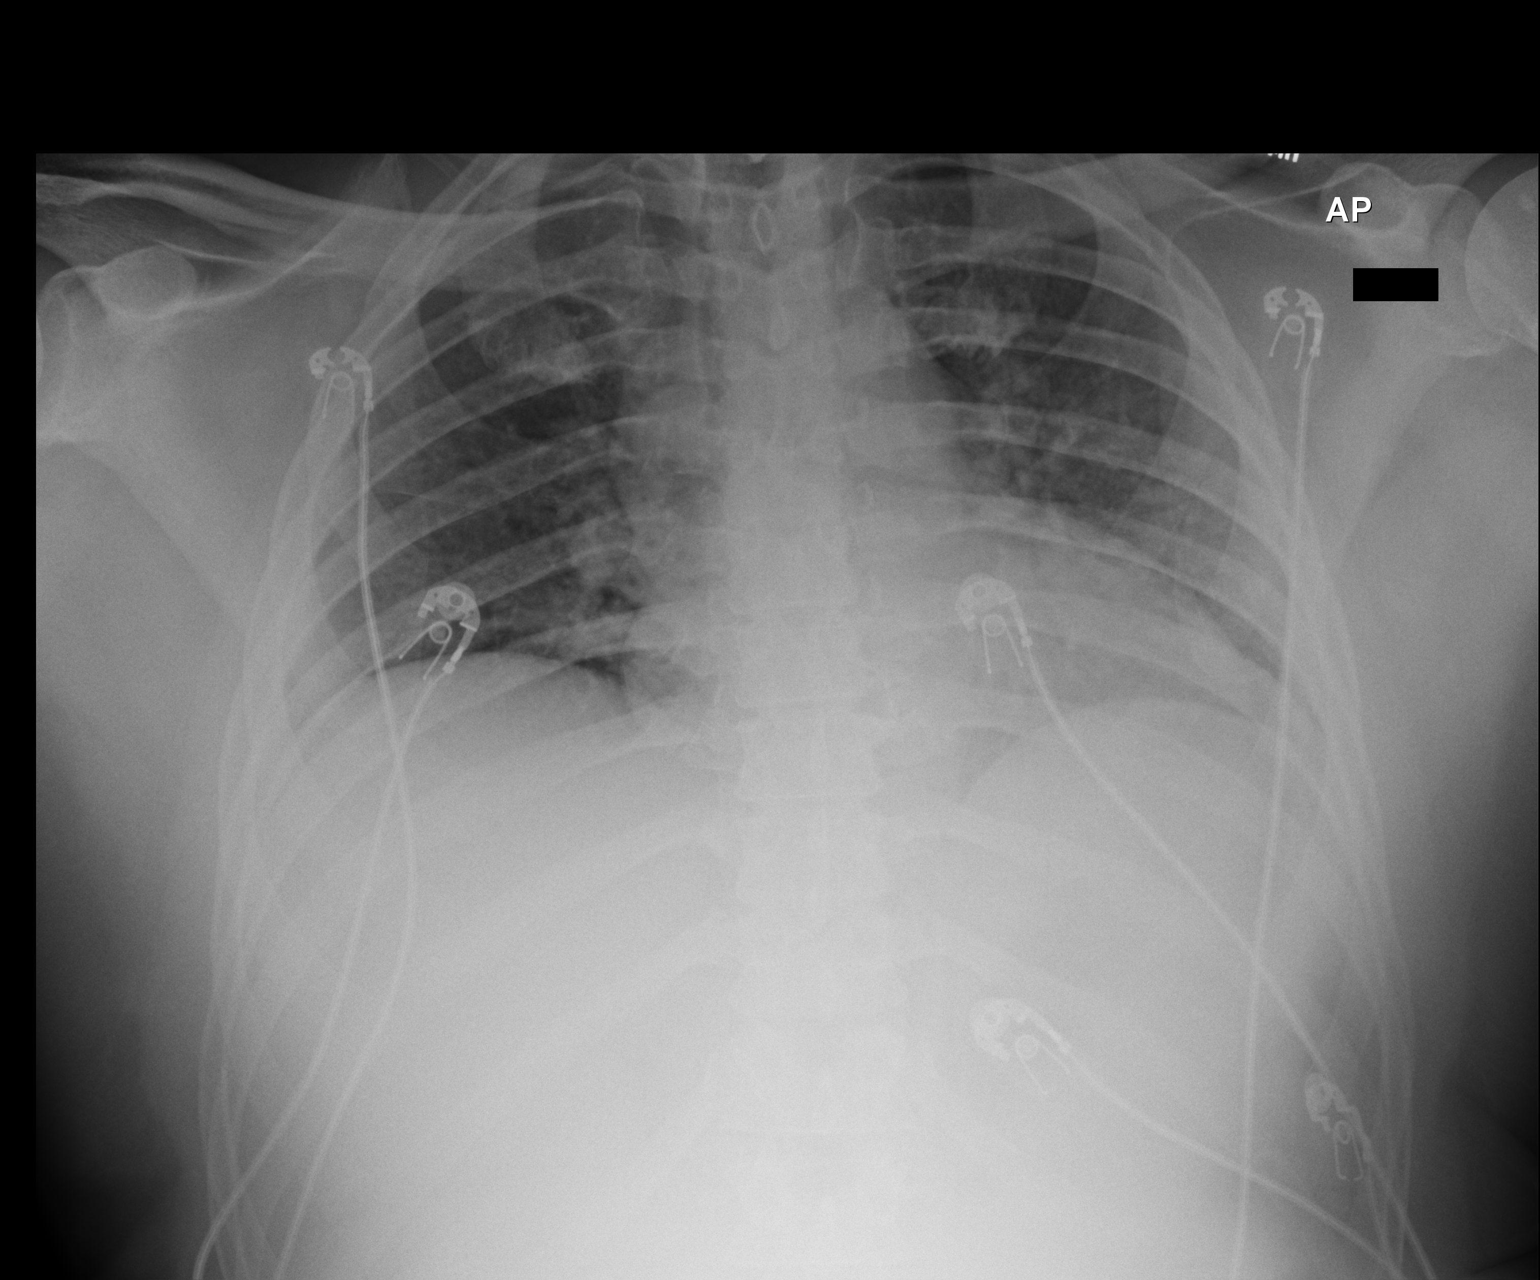

[1 of 1 positions shown; findings below may reference images not displayed]

FINDINGS: There is no edema or consolidation. Heart is borderline enlarged
with pulmonary vascularity within normal limits. No adenopathy. No
bone lesions.
IMPRESSION: Borderline cardiac enlargement.  No edema or consolidation.

## 2015-08-18 ENCOUNTER — Ambulatory Visit (INDEPENDENT_AMBULATORY_CARE_PROVIDER_SITE_OTHER): Payer: 59 | Admitting: Internal Medicine

## 2015-08-18 ENCOUNTER — Encounter: Payer: Self-pay | Admitting: Internal Medicine

## 2015-08-18 VITALS — BP 124/80 | HR 70 | Temp 98.0°F | Resp 16 | Ht 73.0 in

## 2015-08-18 DIAGNOSIS — I10 Essential (primary) hypertension: Secondary | ICD-10-CM | POA: Diagnosis not present

## 2015-08-18 DIAGNOSIS — E559 Vitamin D deficiency, unspecified: Secondary | ICD-10-CM

## 2015-08-18 DIAGNOSIS — Z79899 Other long term (current) drug therapy: Secondary | ICD-10-CM | POA: Diagnosis not present

## 2015-08-18 DIAGNOSIS — R7303 Prediabetes: Secondary | ICD-10-CM | POA: Diagnosis not present

## 2015-08-18 DIAGNOSIS — E782 Mixed hyperlipidemia: Secondary | ICD-10-CM | POA: Diagnosis not present

## 2015-08-18 NOTE — Progress Notes (Signed)
Assessment and Plan:  Hypertension:  -Continue medication -monitor blood pressure at home. -Continue DASH diet -Reminder to go to the ER if any CP, SOB, nausea, dizziness, severe HA, changes vision/speech, left arm numbness and tingling and jaw pain.  Cholesterol - Continue diet and exercise -Check cholesterol.   PreDiabetes without complications -Continue diet and exercise.  -Check A1C  Vitamin D Def -continue medications.   Sleep apnea -letter given for possible appliance   Continue diet and meds as discussed. Further disposition pending results of labs. Discussed med's effects and SE's.    HPI 50 y.o. male  presents for 3 month follow up with hypertension, hyperlipidemia, diabetes and vitamin D deficiency.   His blood pressure has been controlled at home, today their BP is BP: 124/80 mmHg.He does workout. He denies chest pain, shortness of breath, dizziness.  He has been walking a lot lately up to 5 miles daily and he has been on a juicing diet with his wife.   He has taken out disability with the [employer] for his menieres disease.  He is not currently using his CPAP machine due to cost and having to rent the machine from the [place].   He is not on cholesterol medication and denies myalgias. His cholesterol is at goal. The cholesterol was:  02/11/2015: Cholesterol 155; HDL 33*; LDL Cholesterol 77; Triglycerides 226*   He has been working on diet and exercise for diabetes without complications, he is not on bASA, he is on ACE/ARB, and denies  foot ulcerations, hyperglycemia, hypoglycemia , increased appetite, nausea, paresthesia of the feet, polydipsia, polyuria, visual disturbances, vomiting and weight loss. Last A1C was: 02/11/2015: Hgb A1c MFr Bld 6.4*   Patient is on Vitamin D supplement. 02/11/2015: Vit D, 25-Hydroxy 71    Current Medications:  Current Outpatient Prescriptions on File Prior to Visit  Medication Sig Dispense Refill  . cetirizine (ZYRTEC) 10 MG tablet Take 1  tablet (10 mg total) by mouth daily. 90 tablet 4  . Cholecalciferol (VITAMIN D-3) 5000 UNITS TABS Take 10,000 Units by mouth every other day.     . Glucosamine Sulfate 1000 MG CAPS Take 1 capsule by mouth daily.    Marland Kitchen lisinopril (PRINIVIL,ZESTRIL) 20 MG tablet Take 1 tablet (20 mg total) by mouth daily. 30 tablet 0  . Magnesium 500 MG CAPS Take 500 mg by mouth daily.    . methocarbamol (ROBAXIN) 500 MG tablet Take 1 tablet (500 mg total) by mouth every 8 (eight) hours as needed for muscle spasms. 60 tablet 0  . OVER THE COUNTER MEDICATION as needed. OTC Acid Reducer    . testosterone cypionate (DEPOTESTOSTERONE CYPIONATE) 200 MG/ML injection Inject 2 ml IM every 2 weeks. 10 mL 2   No current facility-administered medications on file prior to visit.   Medical History:  Past Medical History  Diagnosis Date  . Hypertension   . Hyperlipidemia   . Hypogonadism male   . Pre-diabetes   . Vertigo   . Sleep apnea     pt does not use CPAP  . GERD (gastroesophageal reflux disease)   . 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 Tinnitus   . PTSD (post-traumatic stress disorder)   . Eye abnormalities     pt states has had twice that the right eye has come out of socket    Allergies:  Allergies  Allergen Reactions  . Ziac [Bisoprolol-Hydrochlorothiazide]     Erectile dysfunction  Review of Systems:  Review of Systems  Constitutional: Negative for fever, chills and malaise/fatigue.  HENT: Negative for congestion, ear pain and sore throat.   Eyes: Negative.   Respiratory: Negative for cough, shortness of breath and wheezing.   Cardiovascular: Negative for chest pain, palpitations and leg swelling.  Gastrointestinal: Negative for heartburn, abdominal pain, constipation and melena.  Genitourinary: Negative.   Skin: Negative.   Neurological: Negative for dizziness, sensory change, loss of consciousness and headaches.  Psychiatric/Behavioral: Negative for  depression. The patient is not nervous/anxious and does not have insomnia.     Family history- Review and unchanged  Social history- Review and unchanged  Physical Exam: BP 124/80 mmHg  Pulse 70  Temp(Src) 98 F (36.7 C) (Temporal)  Resp 16  Ht 6\' 1"  (1.854 m) Wt Readings from Last 3 Encounters:  02/11/15 259 lb 12.8 oz (117.845 kg)  07/31/14 270 lb (122.471 kg)  07/24/14 270 lb (122.471 kg)   General Appearance: Well nourished well developed, non-toxic appearing, in no apparent distress. Eyes: PERRLA, EOMs, conjunctiva no swelling or erythema ENT/Mouth: Ear canals clear with no erythema, swelling, or discharge.  TMs normal bilaterally, oropharynx clear, moist, with no exudate.   Neck: Supple, thyroid normal, no JVD, no cervical adenopathy.  Respiratory: Respiratory effort normal, breath sounds clear A&P, no wheeze, rhonchi or rales noted.  No retractions, no accessory muscle usage Cardio: RRR with no MRGs. No noted edema.  Abdomen: Soft, + BS.  Non tender, no guarding, rebound, hernias, masses. Musculoskeletal: Full ROM, 5/5 strength, Normal gait Skin: Warm, dry without rashes, lesions, ecchymosis.  Neuro: Awake and oriented X 3, Cranial nerves intact. No cerebellar symptoms.  Psych: normal affect, Insight and Judgment appropriate.    Starlyn Skeans, PA-C 5:17 PM [facility]

## 2015-08-18 NOTE — Patient Instructions (Signed)

## 2015-08-19 LAB — LIPID PANEL
CHOLESTEROL: 174 mg/dL (ref 125–200)
HDL: 48 mg/dL (ref >=40)
LDL Cholesterol: 110 mg/dL (ref <130)
Total CHOL/HDL Ratio: 3.6 Ratio (ref <=5.0)
Triglycerides: 79 mg/dL (ref <150)
VLDL: 16 mg/dL (ref <30)

## 2015-08-19 LAB — CBC WITH DIFFERENTIAL/PLATELET
Basophils Absolute: 0 K/uL (ref 0.0–0.1)
Basophils Relative: 0 % (ref 0–1)
Eosinophils Absolute: 0.2 K/uL (ref 0.0–0.7)
Eosinophils Relative: 2 % (ref 0–5)
HCT: 44.7 % (ref 39.0–52.0)
Hemoglobin: 15.3 g/dL (ref 13.0–17.0)
Lymphocytes Relative: 33 % (ref 12–46)
Lymphs Abs: 3 K/uL (ref 0.7–4.0)
MCH: 28.3 pg (ref 26.0–34.0)
MCHC: 34.2 g/dL (ref 30.0–36.0)
MCV: 82.6 fL (ref 78.0–100.0)
MPV: 10.1 fL (ref 8.6–12.4)
Monocytes Absolute: 0.8 K/uL (ref 0.1–1.0)
Monocytes Relative: 9 % (ref 3–12)
Neutro Abs: 5 K/uL (ref 1.7–7.7)
Neutrophils Relative %: 56 % (ref 43–77)
Platelets: 237 K/uL (ref 150–400)
RBC: 5.41 MIL/uL (ref 4.22–5.81)
RDW: 16.6 % — ABNORMAL HIGH (ref 11.5–15.5)
WBC: 9 K/uL (ref 4.0–10.5)

## 2015-08-19 LAB — HEPATIC FUNCTION PANEL
ALBUMIN: 4.6 g/dL (ref 3.6–5.1)
ALK PHOS: 55 U/L (ref 40–115)
ALT: 51 U/L — ABNORMAL HIGH (ref 9–46)
AST: 30 U/L (ref 10–40)
BILIRUBIN DIRECT: 0.2 mg/dL (ref <=0.2)
BILIRUBIN INDIRECT: 0.6 mg/dL (ref 0.2–1.2)
BILIRUBIN TOTAL: 0.8 mg/dL (ref 0.2–1.2)
Total Protein: 7.3 g/dL (ref 6.1–8.1)

## 2015-08-19 LAB — TSH: TSH: 2.05 mIU/L (ref 0.40–4.50)

## 2015-08-19 LAB — HEMOGLOBIN A1C
HEMOGLOBIN A1C: 6.1 % — AB (ref <5.7)
Mean Plasma Glucose: 128 mg/dL

## 2015-08-19 LAB — BASIC METABOLIC PANEL WITH GFR
BUN: 15 mg/dL (ref 7–25)
CHLORIDE: 101 mmol/L (ref 98–110)
CO2: 28 mmol/L (ref 20–31)
Calcium: 9.7 mg/dL (ref 8.6–10.3)
Creat: 1.37 mg/dL — ABNORMAL HIGH (ref 0.60–1.35)
GFR, EST AFRICAN AMERICAN: 70 mL/min (ref >=60)
GFR, EST NON AFRICAN AMERICAN: 61 mL/min (ref >=60)
Glucose, Bld: 77 mg/dL (ref 65–99)
POTASSIUM: 4.1 mmol/L (ref 3.5–5.3)
SODIUM: 140 mmol/L (ref 135–146)

## 2015-11-12 ENCOUNTER — Other Ambulatory Visit: Payer: Self-pay | Admitting: Internal Medicine

## 2015-11-12 MED ORDER — SCOPOLAMINE 1 MG/3DAYS TD PT72: 1.0000 | MEDICATED_PATCH | TRANSDERMAL | Status: DC

## 2016-02-26 ENCOUNTER — Ambulatory Visit (INDEPENDENT_AMBULATORY_CARE_PROVIDER_SITE_OTHER): Payer: 59 | Admitting: Internal Medicine

## 2016-02-26 ENCOUNTER — Encounter: Payer: Self-pay | Admitting: Internal Medicine

## 2016-02-26 VITALS — BP 124/90 | HR 76 | Temp 97.3°F | Resp 16 | Ht 73.0 in | Wt 261.6 lb

## 2016-02-26 DIAGNOSIS — M26623 Arthralgia of bilateral temporomandibular joint: Secondary | ICD-10-CM

## 2016-02-26 DIAGNOSIS — G44219 Episodic tension-type headache, not intractable: Secondary | ICD-10-CM | POA: Diagnosis not present

## 2016-02-26 MED ORDER — CITALOPRAM HYDROBROMIDE 40 MG PO TABS: 40.0000 mg | ORAL_TABLET | Freq: Every day | ORAL | 2 refills | Status: DC

## 2016-02-26 MED ORDER — PREDNISONE 20 MG PO TABS: ORAL_TABLET | ORAL | 0 refills | Status: DC

## 2016-02-26 NOTE — Progress Notes (Signed)
[facility]   Unk Pinto, M.D.    Uvaldo Bristle. Silverio Lay, P.A.-C      Starlyn Skeans, P.A.-C  [facility]                [address] [place], [place] SSN-[id number] Telephone [phone] Telefax [phone] Subjective:    Patient ID: Craig [name], male    DOB: July 16, 1965, 50 y.o.   MRN: [id number]  HPI  Patient presents with c/o of HA's that he alleges were dx'd many years ago at the [place] [facility] as "Migraines". This is the 1st occasion that he has ever mentioned HA's since coming to this office since June 2013. He reports HA's occurring about every other day and usually relieved with Tylenol or caffeine ("sodas"). Location is in the vertex and the frontotemporal areas. 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 He does endorse anxiety and jaw clenching. No reported aura, visual or otherwise. No assoc N/V. He does have hx/o  unilateral hearing impairment and hx/o intermittent dizziness and was recently evaluated at [facility] and condluded insufficient evidence to declare Meniere's Disease.   Medication Sig  . cetirizine (ZYRTEC) 10 MG tablet Take 1 tablet (10 mg total) by mouth daily.  . Cholecalciferol (VITAMIN D-3) 5000 UNITS TABS Take 10,000 Units by mouth every other day.   . Magnesium 500 MG CAPS Take 500 mg by mouth daily.  Marland Kitchen OVER THE COUNTER MEDICATION as needed. OTC Acid Reducer  . methocarbamol (ROBAXIN) 500 MG tablet Take 1 tablet (500 mg total) by mouth every 8 (eight) hours as needed for muscle spasms.  Marland Kitchen lisinopril (PRINIVIL,ZESTRIL) 20 MG tablet Take 1 tablet (20 mg total) by mouth daily.  . Glucosamine Sulfate 1000 MG CAPS Take 1 capsule by mouth daily.  Marland Kitchen scopolamine (TRANSDERM-SCOP, 1.5 MG,) 1 MG/3DAYS Place 1 patch (1.5 mg total) onto the skin every 3 (three) days.   Allergies  Allergen Reactions  . Ziac [Bisoprolol-Hydrochlorothiazide]     Erectile dysfunction   Past Medical History:  Diagnosis  Date  . 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 GERD (gastroesophageal reflux disease)   . 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 Hyperlipidemia   . Hypertension   . Hypogonadism male   . Pre-diabetes   . PTSD (post-traumatic stress disorder)   . Sleep apnea    pt does not use CPAP  . Tinnitus   . Vertigo    Review of Systems  10 point systems review negative except as above.    Objective:   Physical Exam  BP 124/90   P 76   Te 97.3 F    R 16   Ht 6\' 1"     Wt 261 lb 9.6 oz    BMI 34.51    Skin - exposed is negative. HEENT - (+) tender to pressure palpitation at the fronto-temporal areas. Eac's patent. TM's Nl. (+) exquisite tenderness of TM jts. EOM's full. PERRLA. NasoOroPharynx clear. Neck - supple. Nl Thyroid. Carotids 2+ & No bruits, nodes, JVD Chest - Clear equal BS w/o Rales, rhonchi, wheezes. Cor - Nl HS. RRR w/o sig MGR. PP 1(+). No edema. MS- FROM w/o deformities. Muscle power, tone and bulk Nl. Gait Nl. Neuro - No obvious Cr N abnormalities.  Sensory, motor and Cerebellar functions appear Nl w/o focal abnormalities..    Assessment & Plan:   1. Episodic tension-type headache, not intractable  - citalopram (CELEXA) 40 MG tablet; Take 1 tablet (40 mg total) by mouth daily.  Dispense: 30 tablet; Refill: 2 - start at 1/2 tab til see about 2 weeks.  2. Bilateral temporomandibular joint pain  - Recc try heating pad - predniSONE  20 MG ; 1 tab 3 x day for 3 days, then 1 tab 2 x day for 3 days, then 1 tab 1 x day for 5 days  Dispense: 20 tab - can stop when HA resolved

## 2016-02-26 NOTE — Patient Instructions (Addendum)
  Start Citalopram (Celexa) 40 mg  at 1/2 tablet daily for stress & Tension  ++++++++++++++++++++++++++++++++++ Temporomandibular Joint Syndrome Temporomandibular joint (TMJ) syndrome is a condition that affects the joints between your jaw and your skull. The TMJs are located near your ears and allow your jaw to open and close. These joints and the nearby muscles are involved in all movements of the jaw. People with TMJ syndrome have pain in the area of these joints and muscles. Chewing, biting, or other movements of the jaw can be difficult or painful. TMJ syndrome can be caused by various things. In many cases, the condition is mild and goes away within a few weeks. For some people, the condition can become a long-term problem. CAUSES Possible causes of TMJ syndrome include:  Grinding your teeth or clenching your jaw. Some people do this when they are under stress.  Arthritis.  Injury to the jaw.  Head or neck injury.  Teeth or dentures that are not aligned well. In some cases, the cause of TMJ syndrome may not be known. SIGNS AND SYMPTOMS The most common symptom is an aching pain on the side of the head in the area of the TMJ. Other symptoms may include:  Pain when moving your jaw, such as when chewing or biting.  Being unable to open your jaw all the way.  Making a clicking sound when you open your mouth.  Headache.  Earache.  Neck or shoulder pain. DIAGNOSIS Diagnosis can usually be made based on your symptoms, your medical history, and a physical exam. Your health care provider may check the range of motion of your jaw. Imaging tests, such as X-rays or an MRI, are sometimes done. You may need to see your dentist to determine if your teeth and jaw are lined up correctly. TREATMENT TMJ syndrome often goes away on its own. If treatment is needed, the options may include:  Eating soft foods and applying ice or heat.  Medicines to relieve pain or inflammation.  Medicines  to relax the muscles.  A splint, bite plate, or mouthpiece to prevent teeth grinding or jaw clenching.  Relaxation techniques or counseling to help reduce stress.  Transcutaneous electrical nerve stimulation (TENS). This helps to relieve pain by applying an electrical current through the skin.  Acupuncture. This is sometimes helpful to relieve pain.  Jaw surgery. This is rarely needed. HOME CARE INSTRUCTIONS  Take medicines only as directed by your health care provider.  Eat a soft diet if you are having trouble chewing.  Apply ice to the painful area.  Put ice in a plastic bag.  Place a towel between your skin and the bag.  Leave the ice on for 20 minutes, 2-3 times a day.  Apply a warm compress to the painful area as directed.  Massage your jaw area and perform any jaw stretching exercises as recommended by your health care provider.  If you were given a mouthpiece or bite plate, wear it as directed.  Avoid foods that require a lot of chewing. Do not chew gum.  Keep all follow-up visits as directed by your health care provider. This is important. SEEK MEDICAL CARE IF:  You are having trouble eating.  You have new or worsening symptoms. SEEK IMMEDIATE MEDICAL CARE IF:  Your jaw locks open or closed.

## 2016-03-11 ENCOUNTER — Encounter: Payer: Self-pay | Admitting: Internal Medicine

## 2016-03-23 ENCOUNTER — Encounter: Payer: Self-pay | Admitting: Internal Medicine

## 2016-03-23 ENCOUNTER — Ambulatory Visit (INDEPENDENT_AMBULATORY_CARE_PROVIDER_SITE_OTHER): Payer: 59 | Admitting: Internal Medicine

## 2016-03-23 VITALS — BP 132/90 | HR 78 | Temp 98.0°F | Resp 18 | Ht 73.0 in | Wt 260.0 lb

## 2016-03-23 DIAGNOSIS — E559 Vitamin D deficiency, unspecified: Secondary | ICD-10-CM

## 2016-03-23 DIAGNOSIS — Z1329 Encounter for screening for other suspected endocrine disorder: Secondary | ICD-10-CM

## 2016-03-23 DIAGNOSIS — R7303 Prediabetes: Secondary | ICD-10-CM

## 2016-03-23 DIAGNOSIS — E349 Endocrine disorder, unspecified: Secondary | ICD-10-CM

## 2016-03-23 DIAGNOSIS — R51 Headache: Principal | ICD-10-CM

## 2016-03-23 DIAGNOSIS — I10 Essential (primary) hypertension: Secondary | ICD-10-CM

## 2016-03-23 DIAGNOSIS — Z13220 Encounter for screening for lipoid disorders: Secondary | ICD-10-CM

## 2016-03-23 DIAGNOSIS — Z136 Encounter for screening for cardiovascular disorders: Secondary | ICD-10-CM

## 2016-03-23 DIAGNOSIS — Z130 Encounter for screening for diseases of the blood and blood-forming organs and certain disorders involving the immune mechanism: Secondary | ICD-10-CM

## 2016-03-23 DIAGNOSIS — R519 Headache, unspecified: Secondary | ICD-10-CM

## 2016-03-23 DIAGNOSIS — Z0000 Encounter for general adult medical examination without abnormal findings: Secondary | ICD-10-CM | POA: Diagnosis not present

## 2016-03-23 LAB — CBC WITH DIFFERENTIAL/PLATELET
BASOS ABS: 0 {cells}/uL (ref 0–200)
Basophils Relative: 0 %
EOS PCT: 2 %
Eosinophils Absolute: 140 cells/uL (ref 15–500)
HCT: 44.8 % (ref 38.5–50.0)
HEMOGLOBIN: 14.9 g/dL (ref 13.2–17.1)
LYMPHS ABS: 2380 {cells}/uL (ref 850–3900)
Lymphocytes Relative: 34 %
MCH: 28 pg (ref 27.0–33.0)
MCHC: 33.3 g/dL (ref 32.0–36.0)
MCV: 84.1 fL (ref 80.0–100.0)
MONOS PCT: 9 %
MPV: 10.6 fL (ref 7.5–12.5)
Monocytes Absolute: 630 cells/uL (ref 200–950)
NEUTROS ABS: 3850 {cells}/uL (ref 1500–7800)
NEUTROS PCT: 55 %
PLATELETS: 256 10*3/uL (ref 140–400)
RBC: 5.33 MIL/uL (ref 4.20–5.80)
RDW: 16 % — ABNORMAL HIGH (ref 11.0–15.0)
WBC: 7 10*3/uL (ref 3.8–10.8)

## 2016-03-23 MED ORDER — CYCLOBENZAPRINE HCL 10 MG PO TABS: 10.0000 mg | ORAL_TABLET | Freq: Three times a day (TID) | ORAL | 0 refills | Status: DC | PRN

## 2016-03-23 MED ORDER — TOPIRAMATE 50 MG PO TABS: 50.0000 mg | ORAL_TABLET | Freq: Two times a day (BID) | ORAL | 2 refills | Status: DC

## 2016-03-23 NOTE — Patient Instructions (Signed)
Please take flexeril as needed for neck pain and stiffness.  Please use warm compresses on the jaw and area in front of your ears.  Please try to avoid chewing gum and chewy foods.  Please Use nasacort every single day.  2 sprays per nostril.  Please take topamax every night at bedtime.  Please take tylenol 1000 mg and 800 mg of ibuprofen together at the first sign of a headache.  We will meet back together in 1 month to talk about headaches.

## 2016-03-23 NOTE — Progress Notes (Signed)
Patient ID: Craig [name], male   DOB: [birth year], 50 y.o.   MRN: [id number]  Complete Physical  Assessment and Plan:   1. Mixed headache -headaches seem to be mulitfactorial in nature.  Likely component of tension headaches, TMJ, and also possible slight component of migraine.  Patient does not have true TBI.   -if no relief with treatment of topamax and flexeril prn will need to send to neuro - cyclobenzaprine (FLEXERIL) 10 MG tablet; Take 1 tablet (10 mg total) by mouth 3 (three) times daily as needed for muscle spasms.  Dispense: 30 tablet; Refill: 0 - topiramate (TOPAMAX) 50 MG tablet; Take 1 tablet (50 mg total) by mouth 2 (two) times daily.  Dispense: 60 tablet; Refill: 2  2. Routine general medical examination at a health care facility  - CBC with Differential/Platelet - BASIC METABOLIC PANEL WITH GFR - Hepatic function panel - Magnesium  3. Screening for hypercholesterolemia  - Lipid panel  4. Prediabetes  - Hemoglobin A1c - Insulin, random  5. Screening for deficiency anemia  - Iron and TIBC - Vitamin B12  6. Testosterone deficiency  - Testosterone  7. Vitamin D deficiency  - VITAMIN D 25 Hydroxy (Vit-D Deficiency, Fractures)  8. Screening for thyroid disorder  - TSH  9. Essential hypertension  - EKG 12-Lead   Discussed med's effects and SE's. Screening labs and tests as requested with regular follow-up as recommended.  HPI Patient presents for a complete physical.   His blood pressure has been controlled at home, today their BP is BP: 132/90 He does workout. He denies chest pain, shortness of breath, dizziness.   He is not on cholesterol medication and denies myalgias. His cholesterol is not at goal. The cholesterol last visit was:   Lab Results  Component Value Date   CHOL 174 08/18/2015   HDL 48 08/18/2015   LDLCALC 110 08/18/2015   TRIG 79 08/18/2015   CHOLHDL 3.6 08/18/2015    He has been working on diet and exercise for prediabetes,  he is on bASA, he is on ACE/ARB and denies foot ulcerations, hyperglycemia, hypoglycemia , increased appetite, nausea, paresthesia of the feet, polydipsia, polyuria, visual disturbances, vomiting and weight loss. Last A1C in the office was:  Lab Results  Component Value Date   HGBA1C 6.1 (H) 08/18/2015    Patient is on Vitamin D supplement.   Lab Results  Component Value Date   VD25OH 71 02/11/2015      Last PSA was: Lab Results  Component Value Date   PSA 0.77 02/11/2015  .  Denies BPH symptoms daytime frequency, double voiding, dysuria, hematuria, hesitancy, incontinence, intermittency, nocturia, sensation of incomplete bladder emptying, suprapubic pain, urgency or weak urinary stream.  He does follow with urology for his prostate.    Patient reports that he has been having some severe headaches.  He reports that his head has been bothering him in the front of his head, his jaw, and also in the temporal region.  He reports that this is exercerbated by eating sometimes, but sometimes it just comes for no reason.  He reports that he sometimes will drink a soda and this will help.  He also takes bayer back and body.  He does have some light sensitivity and some sound sensitivity.  He reports that he does get nauseated but does not have any vomiting.  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  He has not had any further concussions.  He reports that he does frequently get neck cramping though.  He reports that the headaches have gotten a lot worse in the last 5-6 years.    Current Medications:  Current Outpatient Prescriptions on File Prior to Visit  Medication Sig Dispense Refill  . cetirizine (ZYRTEC) 10 MG tablet Take 1 tablet (10 mg total) by mouth daily. 90 tablet 4  . Cholecalciferol (VITAMIN D-3) 5000 UNITS TABS Take 10,000 Units by mouth every other day.     . citalopram (CELEXA) 40 MG  tablet Take 1 tablet (40 mg total) by mouth daily. 30 tablet 2  . lisinopril (PRINIVIL,ZESTRIL) 20 MG tablet Take 1 tablet (20 mg total) by mouth daily. 30 tablet 0  . Magnesium 500 MG CAPS Take 500 mg by mouth daily.    Marland Kitchen OVER THE COUNTER MEDICATION as needed. OTC Acid Reducer     No current facility-administered medications on file prior to visit.     Health Maintenance:  Immunization History  Administered Date(s) Administered  . PPD Test 12/18/2013, 02/11/2015  . Td 05/24/2010    Tetanus: 2012 Eye Exam:  Dr. Lucita Ferrara, cataracts[facility] [place] [facility];  Dr. Haynes Kerns Patient Care Team: Unk Pinto, MD as PCP - General (Internal Medicine)  Allergies:  Allergies  Allergen Reactions  . Ziac [Bisoprolol-Hydrochlorothiazide]     Erectile dysfunction    Medical History:  Past Medical History:  Diagnosis Date  . 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 GERD (gastroesophageal reflux disease)   . 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 Hyperlipidemia   . Hypertension   . Hypogonadism male   . Pre-diabetes   . PTSD (post-traumatic stress disorder)   . Sleep apnea    pt does not use CPAP  . Tinnitus   . Vertigo     Surgical History:  Past Surgical History:  Procedure Laterality Date  . BACK SURGERY    . BUNIONECTOMY Bilateral   . CATARACT EXTRACTION W/ INTRAOCULAR LENS IMPLANT Left 2014  . JOINT REPLACEMENT    . KIDNEY CYST REMOVAL  2008  . LUMBAR LAMINECTOMY/DECOMPRESSION MICRODISCECTOMY Left 07/31/2014   Procedure: LUMBAR LAMINECTOMY/DECOMPRESSION MICRODISCECTOMY 1 LEVEL L5-S1 ON LEFT;  Surgeon: Susa Day, MD;  Location: [facility];  Service: Orthopedics;  Laterality: Left;  . TOTAL HIP ARTHROPLASTY Right 2007    Family History:  Family History  Problem Relation Age of Onset  . Breast cancer Mother     Social History:   Social History  Substance Use Topics  . Smoking  status: Never Smoker  . Smokeless tobacco: Never Used  . Alcohol use No    Review of Systems:  Review of Systems  Constitutional: Negative for chills, fever and malaise/fatigue.  HENT: Positive for congestion and ear pain. Negative for hearing loss, nosebleeds, sore throat and tinnitus.   Eyes: Negative for blurred vision, double vision and pain.  Respiratory: Negative for cough, shortness of breath and wheezing.   Cardiovascular: Negative for chest pain, palpitations and leg swelling.  Gastrointestinal: Negative for abdominal pain, blood in stool, constipation, diarrhea, heartburn and melena.  Genitourinary: Negative for dysuria, flank pain, frequency, hematuria and urgency.  Musculoskeletal: Positive for neck pain.  Skin: Negative.   Neurological: Positive for headaches. Negative for dizziness, tremors, sensory change, focal weakness, seizures  and loss of consciousness.  Psychiatric/Behavioral: Negative for depression and suicidal ideas. The patient does not have insomnia.     Physical Exam: Estimated body mass index is 34.3 kg/m as calculated from the following:   Height as of this encounter: 6\' 1"  (1.854 m).   Weight as of this encounter: 260 lb (117.9 kg). BP 132/90   Pulse 78   Temp 98 F (36.7 C) (Temporal)   Resp 18   Ht 6\' 1"  (1.854 m)   Wt 260 lb (117.9 kg)   BMI 34.30 kg/m   General Appearance: Well nourished, in no apparent distress.  Eyes: PERRLA, EOMs, conjunctiva no swelling or erythema ENT/Mouth: Ear canals clear bilaterally with no erythema, swelling, discharge.  TMs normal bilaterally with no erythema, bulging, or retractions.  Oropharynx clear and moist with no exudate, swelling, or erythema.  Dentition normal.   Neck: Supple, thyroid normal. No bruits, JVD, cervical adenopathy Respiratory: Respiratory effort normal, BS equal bilaterally without rales, rhonchi, wheezing or stridor.  Cardio: RRR without murmurs, rubs or gallops. Brisk peripheral pulses  without edema.  Chest: symmetric, with normal excursions Abdomen: Soft, nontender, no guarding, rebound, hernias, masses, or organomegaly. Genitourinary: Deferred to Dr. Gaynelle Arabian Musculoskeletal: Full ROM all peripheral extremities,5/5 strength, and normal gait.  Skin: Warm, dry without rashes, lesions, ecchymosis. Neuro: A&Ox3, Cranial nerves intact, reflexes equal bilaterally. Normal muscle tone, no cerebellar symptoms. Sensation intact.  Psych: Normal affect, Insight and Judgment appropriate.   EKG: WNL no changes.   Over 40 minutes of exam, counseling, chart review and critical decision making was performed  Loma Sousa Forcucci 3:13 PM [facility]

## 2016-03-24 ENCOUNTER — Encounter: Payer: Self-pay | Admitting: Internal Medicine

## 2016-03-24 LAB — VITAMIN B12: Vitamin B-12: 305 pg/mL (ref 200–1100)

## 2016-03-24 LAB — BASIC METABOLIC PANEL WITH GFR
BUN: 14 mg/dL (ref 7–25)
CALCIUM: 9.7 mg/dL (ref 8.6–10.3)
CO2: 25 mmol/L (ref 20–31)
CREATININE: 1.25 mg/dL (ref 0.60–1.35)
Chloride: 102 mmol/L (ref 98–110)
GFR, EST NON AFRICAN AMERICAN: 68 mL/min (ref >=60)
GFR, Est African American: 78 mL/min (ref >=60)
Glucose, Bld: 78 mg/dL (ref 65–99)
Potassium: 4 mmol/L (ref 3.5–5.3)
SODIUM: 139 mmol/L (ref 135–146)

## 2016-03-24 LAB — TSH: TSH: 1.73 m[IU]/L (ref 0.40–4.50)

## 2016-03-24 LAB — MAGNESIUM: MAGNESIUM: 1.9 mg/dL (ref 1.5–2.5)

## 2016-03-24 LAB — HEPATIC FUNCTION PANEL
ALT: 32 U/L (ref 9–46)
AST: 35 U/L (ref 10–40)
Albumin: 4.6 g/dL (ref 3.6–5.1)
Alkaline Phosphatase: 52 U/L (ref 40–115)
BILIRUBIN DIRECT: 0.1 mg/dL (ref <=0.2)
BILIRUBIN INDIRECT: 0.5 mg/dL (ref 0.2–1.2)
BILIRUBIN TOTAL: 0.6 mg/dL (ref 0.2–1.2)
Total Protein: 7.4 g/dL (ref 6.1–8.1)

## 2016-03-24 LAB — IRON AND TIBC
%SAT: 32 % (ref 15–60)
Iron: 101 ug/dL (ref 50–180)
TIBC: 320 ug/dL (ref 250–425)
UIBC: 219 ug/dL (ref 125–400)

## 2016-03-24 LAB — LIPID PANEL
CHOL/HDL RATIO: 4.1 ratio (ref <=5.0)
CHOLESTEROL: 167 mg/dL (ref 125–200)
HDL: 41 mg/dL (ref >=40)
LDL Cholesterol: 104 mg/dL (ref <130)
TRIGLYCERIDES: 110 mg/dL (ref <150)
VLDL: 22 mg/dL (ref <30)

## 2016-03-24 LAB — VITAMIN D 25 HYDROXY (VIT D DEFICIENCY, FRACTURES): VIT D 25 HYDROXY: 56 ng/mL (ref 30–100)

## 2016-03-24 LAB — INSULIN, RANDOM: Insulin: 22.7 u[IU]/mL — ABNORMAL HIGH (ref 2.0–19.6)

## 2016-03-24 LAB — HEMOGLOBIN A1C
Hgb A1c MFr Bld: 5.9 % — ABNORMAL HIGH (ref <5.7)
Mean Plasma Glucose: 123 mg/dL

## 2016-03-24 LAB — TESTOSTERONE: TESTOSTERONE: 281 ng/dL (ref 250–827)

## 2016-03-29 ENCOUNTER — Encounter: Payer: Self-pay | Admitting: Internal Medicine

## 2016-07-05 DIAGNOSIS — H00014 Hordeolum externum left upper eyelid: Secondary | ICD-10-CM | POA: Diagnosis not present

## 2016-07-12 DIAGNOSIS — M25512 Pain in left shoulder: Secondary | ICD-10-CM | POA: Diagnosis not present

## 2016-10-11 DIAGNOSIS — R51 Headache: Secondary | ICD-10-CM | POA: Diagnosis not present

## 2016-10-11 DIAGNOSIS — Z125 Encounter for screening for malignant neoplasm of prostate: Secondary | ICD-10-CM | POA: Diagnosis not present

## 2016-10-11 DIAGNOSIS — R21 Rash and other nonspecific skin eruption: Secondary | ICD-10-CM | POA: Diagnosis not present

## 2016-10-11 DIAGNOSIS — Z79899 Other long term (current) drug therapy: Secondary | ICD-10-CM | POA: Diagnosis not present

## 2016-10-11 DIAGNOSIS — I10 Essential (primary) hypertension: Secondary | ICD-10-CM | POA: Diagnosis not present

## 2016-10-15 DIAGNOSIS — R21 Rash and other nonspecific skin eruption: Secondary | ICD-10-CM | POA: Diagnosis not present

## 2016-10-26 ENCOUNTER — Other Ambulatory Visit: Payer: Self-pay | Admitting: Internal Medicine

## 2016-10-28 DIAGNOSIS — G43909 Migraine, unspecified, not intractable, without status migrainosus: Secondary | ICD-10-CM | POA: Diagnosis not present

## 2016-11-11 DIAGNOSIS — M25512 Pain in left shoulder: Secondary | ICD-10-CM | POA: Diagnosis not present

## 2016-11-14 ENCOUNTER — Other Ambulatory Visit: Payer: Self-pay | Admitting: Internal Medicine

## 2016-12-01 DIAGNOSIS — Z1211 Encounter for screening for malignant neoplasm of colon: Secondary | ICD-10-CM | POA: Diagnosis not present

## 2016-12-01 DIAGNOSIS — Z7189 Other specified counseling: Secondary | ICD-10-CM | POA: Diagnosis not present

## 2016-12-08 DIAGNOSIS — Z1211 Encounter for screening for malignant neoplasm of colon: Secondary | ICD-10-CM | POA: Diagnosis not present

## 2016-12-08 DIAGNOSIS — D125 Benign neoplasm of sigmoid colon: Secondary | ICD-10-CM | POA: Diagnosis not present

## 2016-12-08 DIAGNOSIS — Z800 Family history of malignant neoplasm of digestive organs: Secondary | ICD-10-CM | POA: Diagnosis not present

## 2016-12-08 DIAGNOSIS — K635 Polyp of colon: Secondary | ICD-10-CM | POA: Diagnosis not present

## 2016-12-08 LAB — HM COLONOSCOPY

## 2017-01-05 ENCOUNTER — Ambulatory Visit (INDEPENDENT_AMBULATORY_CARE_PROVIDER_SITE_OTHER): Payer: 59

## 2017-01-05 DIAGNOSIS — H6120 Impacted cerumen, unspecified ear: Secondary | ICD-10-CM | POA: Diagnosis not present

## 2017-01-05 NOTE — Progress Notes (Signed)
Pt present for ear cleaning, both ears were cleaned w/warm water & little to no wax was removed from both ears. Pt had no concerns & was happy with results.

## 2017-02-15 DIAGNOSIS — I10 Essential (primary) hypertension: Secondary | ICD-10-CM | POA: Diagnosis not present

## 2017-02-15 DIAGNOSIS — R51 Headache: Secondary | ICD-10-CM | POA: Diagnosis not present

## 2017-02-15 DIAGNOSIS — B360 Pityriasis versicolor: Secondary | ICD-10-CM | POA: Diagnosis not present

## 2017-05-02 ENCOUNTER — Encounter: Payer: Self-pay | Admitting: Internal Medicine

## 2017-05-09 NOTE — Progress Notes (Signed)
Complete Physical  Assessment and Plan:  Diagnoses and all orders for this visit:  Routine general medical examination at a health care facility  Essential hypertension Continue medication Monitor blood pressure at home; call if consistently over 130/80 Continue DASH diet.   Reminder to go to the ER if any CP, SOB, nausea, dizziness, severe HA, changes vision/speech, left arm numbness and tingling and jaw pain.  OSA (obstructive sleep apnea) Sleep apnea- continue CPAP, CPAP is helping with daytime fatigue, weight loss still advised.   GERD Well managed on current medications -OTC PRN Discussed diet, avoiding triggers and other lifestyle changes  Meniere's disease, unspecified laterality Suspected diagnosis by [facility] E[facility]/audiology after episodes of decreased hearing, vertigo and syncope. Reportedly currently treated by lifestyle modification; surgery was discussed but declined at this time. No further episodes of syncope.   Hyperlipidemia Currently at goal with lifestyle modification -     Lipid panel -     TSH  Prediabetes Discussed disease and risks Discussed diet/exercise, weight management  -     Hemoglobin A1c  Vitamin D deficiency Continue supplementation  -     VITAMIN D 25 Hydroxy (Vit-D Deficiency, Fractures)  Medication management -     CBC with Differential/Platelet -     BASIC METABOLIC PANEL WITH GFR -     Hepatic function panel  Obesity (BMI 30.0-34.9) Long discussion about weight loss, diet, and exercise Recommended diet heavy in fruits and veggies and low in animal meats, cheeses, and dairy products, appropriate calorie intake Discussed appropriate weight for height  Follow up at next visit  Spinal stenosis of lumbar region, unspecified whether neurogenic claudication present       Doing well post operation with Dr. Tonita Cong  Screening for cardiovascular condition -     EKG 12-Lead  Screening for hematuria or proteinuria -     Urinalysis w microscopic  + reflex cultur  Discussed med's effects and SE's. Screening labs and tests as requested with regular follow-up as recommended. Over 40 minutes of exam, counseling, chart review and critical decision making was performed  Future Appointments  Date Time Provider [place]  05/11/2018  3:00 PM [name], NP [facility] None     HPI 51 y/o AA male - retired early after working for [employer], separated with two grown children. Patient presents for a complete physical. has Essential hypertension; Hyperlipidemia; Prediabetes; Vitamin D deficiency; OSA (obstructive sleep apnea); Medication management; GERD; TMJ (temporomandibular joint syndrome); Obesity (BMI 30.0-34.9); Lumbar spinal stenosis; and Meniere disease on their problem list. He is being followed by the [place] for frequent headaches; he was prescribed topamax/flexeril at our last visit but reports this was not helpful; [facility] which he reports has helped. He is diagnosed with Meniere's disease and is followed as needed by [facility]- currently managed by lifestyle.   BMI is Body mass index is 34.04 kg/m., he has been working on diet and exercise- he reports he walks 5 miles a day as weather allows which also helps his back stiffness significantly.   Wt Readings from Last 3 Encounters:  05/10/17 258 lb (117 kg)  03/23/16 260 lb (117.9 kg)  02/26/16 261 lb 9.6 oz (118.7 kg)   His blood pressure has been controlled at home, today their BP is BP: 128/88 He does workout. He denies chest pain, shortness of breath, dizziness.   He is not on cholesterol medication and denies myalgias. His cholesterol is at goal. The cholesterol last  visit was:   Lab Results  Component Value Date   CHOL 167 03/23/2016   HDL 41 03/23/2016   LDLCALC 104 03/23/2016   TRIG 110 03/23/2016   CHOLHDL 4.1 03/23/2016   He has been working on diet and exercise for prediabetes, he is not on bASA, he is on ACE/ARB and denies  increased appetite, nausea, paresthesia of the feet, polydipsia, polyuria, visual disturbances and vomiting. Last A1C in the office was:  Lab Results  Component Value Date   HGBA1C 5.9 (H) 03/23/2016   Last GFR:   Lab Results  Component Value Date   GFRAA 78 03/23/2016   Patient is on Vitamin D supplement but below goal at last check:    Lab Results  Component Value Date   VD25OH 56 03/23/2016     Last PSA was: Lab Results  Component Value Date   PSA 0.77 02/11/2015    Current Medications:  Current Outpatient Medications on File Prior to Visit  Medication Sig Dispense Refill  . cetirizine (ZYRTEC) 10 MG tablet Take 1 tablet (10 mg total) by mouth daily. (Patient taking differently: Take 10 mg by mouth as needed. ) 90 tablet 4  . Cholecalciferol (VITAMIN D-3) 5000 UNITS TABS Take 10,000 Units by mouth every other day.     . Magnesium 500 MG CAPS Take 500 mg by mouth daily.    Marland Kitchen OVER THE COUNTER MEDICATION as needed. OTC Acid Reducer    . citalopram (CELEXA) 40 MG tablet Take 1 tablet (40 mg total) by mouth daily. 30 tablet 2  . cyclobenzaprine (FLEXERIL) 10 MG tablet Take 1 tablet (10 mg total) by mouth 3 (three) times daily as needed for muscle spasms. (Patient not taking: Reported on 05/10/2017) 30 tablet 0  . lisinopril (PRINIVIL,ZESTRIL) 20 MG tablet Take 1 tablet (20 mg total) by mouth daily. 30 tablet 0  . topiramate (TOPAMAX) 50 MG tablet Take 1 tablet (50 mg total) by mouth 2 (two) times daily. 60 tablet 2   No current facility-administered medications on file prior to visit.    Allergies:  Allergies  Allergen Reactions  . Ziac [Bisoprolol-Hydrochlorothiazide]     Erectile dysfunction   Health Maintenance:  Immunization History  Administered Date(s) Administered  . PPD Test 12/18/2013, 02/11/2015  . Td 05/24/2010   Tetanus:2012 Pneumovax: n/a Prevnar 13: n/a Flu vaccine: Declines Zostavax: n/a  DEXA: n/a Colonoscopy: Reportedly got one recently at [facility];  release form filled out and report requested EGD: n/a  Eye Exam: Dr. Lucita Ferrara, cataracts, [facility] [place] Dentist: Dr. Haynes Kerns -  Patient Care Team: Unk Pinto, MD as PCP - General (Internal Medicine)  Medical History:  has Essential hypertension; Hyperlipidemia; Prediabetes; Vitamin D deficiency; OSA (obstructive sleep apnea); Medication management; GERD; TMJ (temporomandibular joint syndrome); Obesity (BMI 30.0-34.9); Lumbar spinal stenosis; and Meniere disease on their problem list. Surgical History:  He  has a past surgical history that includes Total hip arthroplasty (Right, 2007); Kidney cyst removal (2008); Cataract extraction w/ intraocular lens implant (Left, 2014); Bunionectomy (Bilateral); Lumbar laminectomy/decompression microdiscectomy (Left, 07/31/2014); and Cataract extraction (Right). Family History:  His family history includes Breast cancer in his mother. Social History:   reports that  has never smoked. he has never used smokeless tobacco. He reports that he does not drink alcohol or use drugs. Review of Systems:  Review of Systems  Constitutional: Negative for malaise/fatigue and weight loss.  HENT: Negative for ear discharge, ear pain, hearing loss and tinnitus.   Eyes: Negative for blurred  vision and double vision.  Respiratory: Negative for cough, shortness of breath and wheezing.   Cardiovascular: Negative for chest pain, palpitations, orthopnea, claudication and leg swelling.  Gastrointestinal: Negative for abdominal pain, blood in stool, constipation, diarrhea, heartburn, melena, nausea and vomiting.  Genitourinary: Negative.   Musculoskeletal: Negative for back pain (Endorses stiffness with extended immobility), falls, joint pain and myalgias.  Skin: Negative for rash.  Neurological: Positive for dizziness (Intermittent; vertigo related to Meniere's). Negative for tingling, sensory change, weakness and headaches.  Endo/Heme/Allergies: Negative for  polydipsia.  Psychiatric/Behavioral: Negative.   All other systems reviewed and are negative.   Physical Exam: Estimated body mass index is 34.04 kg/m as calculated from the following:   Height as of this encounter: 6\' 1"  (1.854 m).   Weight as of this encounter: 258 lb (117 kg). BP 128/88   Pulse 79   Temp (!) 97.3 F (36.3 C)   Ht 6\' 1"  (1.854 m)   Wt 258 lb (117 kg)   SpO2 98%   BMI 34.04 kg/m  General Appearance: Well nourished, in no apparent distress.  Eyes: PERRLA, EOMs, conjunctiva no swelling or erythema, normal fundi and vessels.  Sinuses: No Frontal/maxillary tenderness  ENT/Mouth: Ext aud canals clear, normal light reflex with TMs without erythema, bulging. Good dentition. No erythema, swelling, or exudate on post pharynx. Tonsils not swollen or erythematous. Hearing normal.  Neck: Supple, thyroid normal. No bruits  Respiratory: Respiratory effort normal, BS equal bilaterally without rales, rhonchi, wheezing or stridor.  Cardio: RRR without murmurs, rubs or gallops. Brisk peripheral pulses without edema.  Chest: symmetric, with normal excursions and percussion.  Abdomen: Soft, nontender, no guarding, rebound, hernias, masses, or organomegaly.  Lymphatics: Non tender without lymphadenopathy.  Genitourinary: Defer Musculoskeletal: Full ROM all peripheral extremities,5/5 strength, and normal gait.  Skin: Warm, dry without rashes, lesions, ecchymosis. Neuro: Cranial nerves intact, reflexes equal bilaterally. Normal muscle tone, no cerebellar symptoms. Sensation intact.  Psych: Awake and oriented X 3, normal affect, Insight and Judgment appropriate.   EKG: WNL no changes.  Gorden Harms Atonya Templer 3:24 PM [facility]

## 2017-05-10 ENCOUNTER — Encounter: Payer: Self-pay | Admitting: Adult Health

## 2017-05-10 ENCOUNTER — Ambulatory Visit: Payer: 59 | Admitting: Adult Health

## 2017-05-10 VITALS — BP 128/88 | HR 79 | Temp 97.3°F | Ht 73.0 in | Wt 258.0 lb

## 2017-05-10 DIAGNOSIS — K2100 Gastro-esophageal reflux disease with esophagitis, without bleeding: Secondary | ICD-10-CM

## 2017-05-10 DIAGNOSIS — I10 Essential (primary) hypertension: Secondary | ICD-10-CM | POA: Diagnosis not present

## 2017-05-10 DIAGNOSIS — Z0000 Encounter for general adult medical examination without abnormal findings: Secondary | ICD-10-CM | POA: Diagnosis not present

## 2017-05-10 DIAGNOSIS — Z136 Encounter for screening for cardiovascular disorders: Secondary | ICD-10-CM

## 2017-05-10 DIAGNOSIS — H8113 Benign paroxysmal vertigo, bilateral: Secondary | ICD-10-CM

## 2017-05-10 DIAGNOSIS — E782 Mixed hyperlipidemia: Secondary | ICD-10-CM

## 2017-05-10 DIAGNOSIS — E669 Obesity, unspecified: Secondary | ICD-10-CM

## 2017-05-10 DIAGNOSIS — M26609 Unspecified temporomandibular joint disorder, unspecified side: Secondary | ICD-10-CM

## 2017-05-10 DIAGNOSIS — R7303 Prediabetes: Secondary | ICD-10-CM

## 2017-05-10 DIAGNOSIS — G4733 Obstructive sleep apnea (adult) (pediatric): Secondary | ICD-10-CM

## 2017-05-10 DIAGNOSIS — Z1389 Encounter for screening for other disorder: Secondary | ICD-10-CM

## 2017-05-10 DIAGNOSIS — M48061 Spinal stenosis, lumbar region without neurogenic claudication: Secondary | ICD-10-CM

## 2017-05-10 DIAGNOSIS — E559 Vitamin D deficiency, unspecified: Secondary | ICD-10-CM

## 2017-05-10 DIAGNOSIS — H8109 Meniere's disease, unspecified ear: Secondary | ICD-10-CM

## 2017-05-10 DIAGNOSIS — Z79899 Other long term (current) drug therapy: Secondary | ICD-10-CM

## 2017-05-11 LAB — CBC WITH DIFFERENTIAL/PLATELET
BASOS PCT: 0.4 %
Basophils Absolute: 30 cells/uL (ref 0–200)
Eosinophils Absolute: 163 cells/uL (ref 15–500)
Eosinophils Relative: 2.2 %
HEMATOCRIT: 45.3 % (ref 38.5–50.0)
Hemoglobin: 15 g/dL (ref 13.2–17.1)
LYMPHS ABS: 2420 {cells}/uL (ref 850–3900)
MCH: 26.8 pg — ABNORMAL LOW (ref 27.0–33.0)
MCHC: 33.1 g/dL (ref 32.0–36.0)
MCV: 81 fL (ref 80.0–100.0)
MPV: 11.3 fL (ref 7.5–12.5)
Monocytes Relative: 9.3 %
Neutro Abs: 4100 cells/uL (ref 1500–7800)
Neutrophils Relative %: 55.4 %
Platelets: 256 10*3/uL (ref 140–400)
RBC: 5.59 10*6/uL (ref 4.20–5.80)
RDW: 14 % (ref 11.0–15.0)
Total Lymphocyte: 32.7 %
WBC: 7.4 10*3/uL (ref 3.8–10.8)
WBCMIX: 688 {cells}/uL (ref 200–950)

## 2017-05-11 LAB — BASIC METABOLIC PANEL WITH GFR
BUN: 14 mg/dL (ref 7–25)
CALCIUM: 9.3 mg/dL (ref 8.6–10.3)
CHLORIDE: 103 mmol/L (ref 98–110)
CO2: 29 mmol/L (ref 20–32)
Creat: 1.11 mg/dL (ref 0.60–1.35)
GFR, EST AFRICAN AMERICAN: 90 mL/min/{1.73_m2} (ref >=60)
GFR, EST NON AFRICAN AMERICAN: 78 mL/min/{1.73_m2} (ref >=60)
Glucose, Bld: 79 mg/dL (ref 65–99)
Potassium: 3.7 mmol/L (ref 3.5–5.3)
Sodium: 140 mmol/L (ref 135–146)

## 2017-05-11 LAB — URINALYSIS W MICROSCOPIC + REFLEX CULTURE
Bacteria, UA: NONE SEEN /HPF
Bilirubin Urine: NEGATIVE
HGB URINE DIPSTICK: NEGATIVE
HYALINE CAST: NONE SEEN /LPF
Ketones, ur: NEGATIVE
LEUKOCYTE ESTERASE: NEGATIVE
Nitrites, Initial: NEGATIVE
PH: 5.5 (ref 5.0–8.0)
Protein, ur: NEGATIVE
RBC / HPF: NONE SEEN /HPF (ref 0–2)
SPECIFIC GRAVITY, URINE: 1.024 (ref 1.001–1.03)
Squamous Epithelial / LPF: NONE SEEN /HPF (ref <=5)
WBC UA: NONE SEEN /HPF (ref 0–5)

## 2017-05-11 LAB — LIPID PANEL
CHOLESTEROL: 159 mg/dL (ref <200)
HDL: 41 mg/dL (ref >40)
LDL CHOLESTEROL (CALC): 95 mg/dL
Non-HDL Cholesterol (Calc): 118 mg/dL (calc) (ref <130)
TRIGLYCERIDES: 129 mg/dL (ref <150)
Total CHOL/HDL Ratio: 3.9 (calc) (ref <5.0)

## 2017-05-11 LAB — TSH: TSH: 1.65 mIU/L (ref 0.40–4.50)

## 2017-05-11 LAB — VITAMIN D 25 HYDROXY (VIT D DEFICIENCY, FRACTURES): VIT D 25 HYDROXY: 37 ng/mL (ref 30–100)

## 2017-05-11 LAB — HEPATIC FUNCTION PANEL
AG Ratio: 1.9 (calc) (ref 1.0–2.5)
ALBUMIN MSPROF: 4.5 g/dL (ref 3.6–5.1)
ALT: 27 U/L (ref 9–46)
AST: 31 U/L (ref 10–40)
Alkaline phosphatase (APISO): 59 U/L (ref 40–115)
Bilirubin, Direct: 0.1 mg/dL (ref 0.0–0.2)
Globulin: 2.4 g/dL (calc) (ref 1.9–3.7)
Indirect Bilirubin: 0.4 mg/dL (calc) (ref 0.2–1.2)
Total Bilirubin: 0.5 mg/dL (ref 0.2–1.2)
Total Protein: 6.9 g/dL (ref 6.1–8.1)

## 2017-05-11 LAB — MAGNESIUM: Magnesium: 1.9 mg/dL (ref 1.5–2.5)

## 2017-05-11 LAB — HEMOGLOBIN A1C
EAG (MMOL/L): 7 (calc)
HEMOGLOBIN A1C: 6 %{Hb} — AB (ref <5.7)
Mean Plasma Glucose: 126 (calc)

## 2017-05-11 LAB — NO CULTURE INDICATED

## 2017-06-10 DIAGNOSIS — J029 Acute pharyngitis, unspecified: Secondary | ICD-10-CM | POA: Diagnosis not present

## 2017-06-10 DIAGNOSIS — J018 Other acute sinusitis: Secondary | ICD-10-CM | POA: Diagnosis not present

## 2017-06-10 DIAGNOSIS — R52 Pain, unspecified: Secondary | ICD-10-CM | POA: Diagnosis not present

## 2017-06-17 DIAGNOSIS — J988 Other specified respiratory disorders: Secondary | ICD-10-CM | POA: Diagnosis not present

## 2017-06-17 DIAGNOSIS — R05 Cough: Secondary | ICD-10-CM | POA: Diagnosis not present

## 2017-06-17 DIAGNOSIS — H6993 Unspecified Eustachian tube disorder, bilateral: Secondary | ICD-10-CM | POA: Diagnosis not present

## 2017-06-24 DIAGNOSIS — J018 Other acute sinusitis: Secondary | ICD-10-CM | POA: Diagnosis not present

## 2017-06-24 DIAGNOSIS — R05 Cough: Secondary | ICD-10-CM | POA: Diagnosis not present

## 2017-07-15 DIAGNOSIS — J018 Other acute sinusitis: Secondary | ICD-10-CM | POA: Diagnosis not present

## 2017-07-23 DIAGNOSIS — S3991XA Unspecified injury of abdomen, initial encounter: Secondary | ICD-10-CM | POA: Diagnosis not present

## 2017-07-23 DIAGNOSIS — R079 Chest pain, unspecified: Secondary | ICD-10-CM | POA: Diagnosis not present

## 2017-07-23 DIAGNOSIS — S199XXA Unspecified injury of neck, initial encounter: Secondary | ICD-10-CM | POA: Diagnosis not present

## 2017-07-23 DIAGNOSIS — M549 Dorsalgia, unspecified: Secondary | ICD-10-CM | POA: Diagnosis not present

## 2017-07-23 DIAGNOSIS — S3992XA Unspecified injury of lower back, initial encounter: Secondary | ICD-10-CM | POA: Diagnosis not present

## 2017-07-23 DIAGNOSIS — R102 Pelvic and perineal pain: Secondary | ICD-10-CM | POA: Diagnosis not present

## 2017-07-23 DIAGNOSIS — S299XXA Unspecified injury of thorax, initial encounter: Secondary | ICD-10-CM | POA: Diagnosis not present

## 2017-07-23 DIAGNOSIS — M542 Cervicalgia: Secondary | ICD-10-CM | POA: Diagnosis not present

## 2017-07-23 DIAGNOSIS — R109 Unspecified abdominal pain: Secondary | ICD-10-CM | POA: Diagnosis not present

## 2017-07-28 ENCOUNTER — Other Ambulatory Visit: Payer: Self-pay | Admitting: Internal Medicine

## 2017-07-28 ENCOUNTER — Other Ambulatory Visit: Payer: Self-pay | Admitting: *Deleted

## 2017-07-28 MED ORDER — SILDENAFIL CITRATE 100 MG PO TABS: ORAL_TABLET | ORAL | 0 refills | Status: DC

## 2017-08-01 DIAGNOSIS — S63641A Sprain of metacarpophalangeal joint of right thumb, initial encounter: Secondary | ICD-10-CM | POA: Diagnosis not present

## 2017-08-01 DIAGNOSIS — M79644 Pain in right finger(s): Secondary | ICD-10-CM | POA: Diagnosis not present

## 2017-08-01 DIAGNOSIS — W010XXA Fall on same level from slipping, tripping and stumbling without subsequent striking against object, initial encounter: Secondary | ICD-10-CM | POA: Diagnosis not present

## 2017-08-02 DIAGNOSIS — C642 Malignant neoplasm of left kidney, except renal pelvis: Secondary | ICD-10-CM | POA: Diagnosis not present

## 2017-08-02 DIAGNOSIS — N400 Enlarged prostate without lower urinary tract symptoms: Secondary | ICD-10-CM | POA: Diagnosis not present

## 2017-08-15 NOTE — Progress Notes (Signed)
FOLLOW UP  Assessment and Plan:   Hypertension Well controlled with current medications  Monitor blood pressure at home; patient to call if consistently greater than 130/80 Continue DASH diet.   Reminder to go to the ER if any CP, SOB, nausea, dizziness, severe HA, changes vision/speech, left arm numbness and tingling and jaw pain.  Cholesterol Borderline elevated for several years; most recent check at goal by lifestyle modification  Continue low cholesterol diet and exercise.  Check lipid panel.   Prediabetes Continue diet and exercise.  Perform daily foot/skin check, notify office of any concerning changes.  Check A1C  Obesity with co morbidities Long discussion about weight loss, diet, and exercise Recommended diet heavy in fruits and veggies and low in animal meats, cheeses, and dairy products, appropriate calorie intake Discussed ideal weight for height  Patient will work on checking fat percentage, start tracking calories/macros on app  Will follow up in 3 months  Vitamin D Def Below goal at last visit; has increased supplement to reach goal of 70-100 Check Vit D level  Testosterone deficiency Previously on supplementation, has been working on weight loss and exercise Recommended start zinc 50 mg daily supplement  Can recheck in 6 months, if continues to be low and symptomatic, can discuss supplementation  Continue diet and meds as discussed. Further disposition pending results of labs. Discussed med's effects and SE's.   Over 30 minutes of exam, counseling, chart review, and critical decision making was performed.   Future Appointments  Date Time Provider [place]  01/26/2018  9:30 AM [name], NP [facility] None  05/11/2018  3:00 PM [name], NP [facility] None    ----------------------------------------------------------------------------------------------------------------------  HPI 52 y.o. Craig [name]  presents for 3 month follow up on  hypertension, cholesterol, prediabetes, obestiy and vitamin D deficiency.   BMI is Body mass index is 34.43 kg/m., he has been working on diet and exercise - walks 5 miles daily.  Wt Readings from Last 3 Encounters:  08/16/17 261 lb (118.4 kg)  05/10/17 258 lb (117 kg)  03/23/16 260 lb (117.9 kg)   His blood pressure has been controlled at home, today their BP is BP: 122/82  He does workout. He denies chest pain, shortness of breath, dizziness.   He is not on cholesterol medication and denies myalgias. His cholesterol is at goal. The cholesterol last visit was:   Lab Results  Component Value Date   CHOL 159 05/10/2017   HDL 41 05/10/2017   LDLCALC 95 05/10/2017   TRIG 129 05/10/2017   CHOLHDL 3.9 05/10/2017    He has been working on diet and exercise for prediabetes, and denies foot ulcerations, increased appetite, nausea, paresthesia of the feet, polydipsia, polyuria, visual disturbances, vomiting and weight loss. Last A1C in the office was:  Lab Results  Component Value Date   HGBA1C 6.0 (H) 05/10/2017   Patient is on Vitamin D supplement but remains well below goal of 70 at most recent check:   Lab Results  Component Value Date   VD25OH 37 05/10/2017     He has a history of testosterone deficiency but is not currently on supplement:  Lab Results  Component Value Date   TESTOSTERONE 281 03/23/2016     Current Medications:  Current Outpatient Medications on File Prior to Visit  Medication Sig  . cetirizine (ZYRTEC) 10 MG tablet Take 1 tablet (10 mg total) by mouth daily. (Patient taking differently: Take 10 mg by mouth as needed. )  . Cholecalciferol (VITAMIN  D-3) 5000 UNITS TABS Take 10,000 Units by mouth every other day.   . Magnesium 500 MG CAPS Take 500 mg by mouth daily.  Marland Kitchen OVER THE COUNTER MEDICATION as needed. OTC Acid Reducer  . SUMAtriptan (IMITREX) 25 MG tablet Take 25 mg by mouth as needed for migraine. May repeat in 2 hours if headache persists or recurs.  Marland Kitchen  lisinopril (PRINIVIL,ZESTRIL) 20 MG tablet Take 1 tablet (20 mg total) by mouth daily. (Patient not taking: Reported on 08/16/2017)  . sildenafil (VIAGRA) 100 MG tablet Take 1/2 to 1 tablet daily as needed for erectile dysfunction. (Patient not taking: Reported on 08/16/2017)   No current facility-administered medications on file prior to visit.      Allergies:  Allergies  Allergen Reactions  . Ziac [Bisoprolol-Hydrochlorothiazide]     Erectile dysfunction     Medical History:  Past Medical History:  Diagnosis Date  . 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 GERD (gastroesophageal reflux disease)   . 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 Hyperlipidemia   . Hypertension   . Hypogonadism Craig [name]   . Lumbar spinal stenosis 07/31/2014  . Pre-diabetes   . PTSD (post-traumatic stress disorder)   . Sleep apnea    pt does not use CPAP  . Tinnitus   . Vertigo    Family history- Reviewed and unchanged Social history- Reviewed and unchanged   Review of Systems:  Review of Systems  Constitutional: Negative for malaise/fatigue and weight loss.  HENT: Negative for hearing loss and tinnitus.   Eyes: Negative for blurred vision and double vision.  Respiratory: Negative for cough, shortness of breath and wheezing.   Cardiovascular: Negative for chest pain, palpitations, orthopnea, claudication and leg swelling.  Gastrointestinal: Negative for abdominal pain, blood in stool, constipation, diarrhea, heartburn, melena, nausea and vomiting.  Genitourinary: Negative.   Musculoskeletal: Negative for joint pain and myalgias.  Skin: Negative for rash.  Neurological: Negative for dizziness, tingling, sensory change, weakness and headaches.  Endo/Heme/Allergies: Negative for polydipsia.  Psychiatric/Behavioral: Negative.   All other systems reviewed and are negative.     Physical Exam: BP 122/82   Pulse 71   Temp  (!) 96.8 F (36 C)   Ht 6\' 1"  (1.854 m)   Wt 261 lb (118.4 kg)   SpO2 97%   BMI 34.43 kg/m  Wt Readings from Last 3 Encounters:  08/16/17 261 lb (118.4 kg)  05/10/17 258 lb (117 kg)  03/23/16 260 lb (117.9 kg)   General Appearance: Well nourished, in no apparent distress. Eyes: PERRLA, EOMs, conjunctiva no swelling or erythema Sinuses: No Frontal/maxillary tenderness ENT/Mouth: Ext aud canals clear, TMs without erythema, bulging. No erythema, swelling, or exudate on post pharynx.  Tonsils not swollen or erythematous. Hearing normal.  Neck: Supple, thyroid normal.  Respiratory: Respiratory effort normal, BS equal bilaterally without rales, rhonchi, wheezing or stridor.  Cardio: RRR with no MRGs. Brisk peripheral pulses without edema.  Abdomen: Soft, + BS.  Non tender, no guarding, rebound, hernias, masses. Lymphatics: Non tender without lymphadenopathy.  Musculoskeletal: Full ROM, 5/5 strength, Normal gait Skin: Warm, dry without rashes, lesions, ecchymosis.  Neuro: Cranial nerves intact. No cerebellar symptoms.  Psych: Awake and oriented X 3, normal affect, Insight and Judgment appropriate.    Izora Ribas, NP 10:15 AM [facility]

## 2017-08-16 ENCOUNTER — Ambulatory Visit: Payer: 59 | Admitting: Adult Health

## 2017-08-16 ENCOUNTER — Encounter: Payer: Self-pay | Admitting: Adult Health

## 2017-08-16 VITALS — BP 122/82 | HR 71 | Temp 96.8°F | Ht 73.0 in | Wt 261.0 lb

## 2017-08-16 DIAGNOSIS — I10 Essential (primary) hypertension: Secondary | ICD-10-CM

## 2017-08-16 DIAGNOSIS — E559 Vitamin D deficiency, unspecified: Secondary | ICD-10-CM | POA: Diagnosis not present

## 2017-08-16 DIAGNOSIS — E349 Endocrine disorder, unspecified: Secondary | ICD-10-CM | POA: Diagnosis not present

## 2017-08-16 DIAGNOSIS — E669 Obesity, unspecified: Secondary | ICD-10-CM

## 2017-08-16 DIAGNOSIS — Z79899 Other long term (current) drug therapy: Secondary | ICD-10-CM | POA: Diagnosis not present

## 2017-08-16 DIAGNOSIS — E782 Mixed hyperlipidemia: Secondary | ICD-10-CM

## 2017-08-16 DIAGNOSIS — R7303 Prediabetes: Secondary | ICD-10-CM | POA: Diagnosis not present

## 2017-08-16 NOTE — Patient Instructions (Addendum)
Try zinc 50 mg daily for low testosterone -   Consider getting a fat monitor (25$ on [email or web address])   Recommend trying tracking calories on app - and see what your macro intakes look like - Lose it! App is free and provides all these things.   Aim for 7+ servings of fruits and vegetables daily  80+ fluid ounces of water or unsweet tea for healthy kidneys  Limit alcohol intake   Limit animal fats in diet for cholesterol and heart health - choose grass fed whenever available  Aim for low stress - take time to unwind and care for your mental health  Aim for 150 min of moderate intensity exercise weekly for heart health, and weights twice weekly for bone health  Aim for 7-9 hours of sleep daily      When it comes to diets, agreement about the perfect plan isn't easy to find, even among the experts. Experts at the [employer] developed an idea known as the Healthy Eating Plate. Just imagine a plate divided into logical, healthy portions.  The emphasis is on diet quality:  Load up on vegetables and fruits - one-half of your plate: Aim for color and variety, and remember that potatoes don't count.  Go for whole grains - one-quarter of your plate: Whole wheat, barley, wheat berries, quinoa, oats, brown rice, and foods made with them. If you want pasta, go with whole wheat pasta.  Protein power - one-quarter of your plate: Fish, chicken, beans, and nuts are all healthy, versatile protein sources. Limit red meat.  The diet, however, does go beyond the plate, offering a few other suggestions.  Use healthy plant oils, such as olive, canola, soy, corn, sunflower and peanut. Check the labels, and avoid partially hydrogenated oil, which have unhealthy trans fats.  If you're thirsty, drink water. Coffee and tea are good in moderation, but skip sugary drinks and limit milk and dairy products to one or two daily servings.  The type of carbohydrate in the diet is more important  than the amount. Some sources of carbohydrates, such as vegetables, fruits, whole grains, and beans-are healthier than others.  Finally, stay active.   Intermittent fasting is more about strategy than starvation. It's meant to reset your body in different ways, hopefully with fitness and nutrition changes as a result.  Like any big switchover, though, results may vary when it comes down to the individual level. What works for your friends may not work for you, or vice versa. That's why it's helpful to play around with variations on intermittent fasting and healthy habits and find what works best for you.  WHAT IS INTERMITTENT FASTING AND WHY DO IT?  Intermittent fasting doesn't involve specific foods, but rather, a strict schedule regarding when you eat. Also called "time-restricted eating," the tactic has been praised for its contribution to weight loss, improved body composition, and decreased cravings. Preliminary research also suggests it may be beneficial for glucose tolerance, hormone regulation, better muscle mass and lower body fat.  Part of its appeal is the simplicity of the effort. Unlike some other trends, there's no calculations to intermittent fasting.  You simply eat within a certain block of time, usually a window of 8-10 hours. In the other big block of time - about 14-16 hours, including when you're asleep - you don't eat anything, not even snacks. You can drink water, coffee, tea or any other beverage that doesn't have calories.  For example, if you like  having a late dinner, you might skip breakfast and have your first meal at noon and your last meal of the day at 8 p.m., and then not eat until noon again the next day.  IDEAS FOR GETTING STARTED  If you're new to the strategy, it may be helpful to eat within the typical circadian rhythm and keep eating within daylight hours. This can be especially beneficial if you're looking at intermittent fasting for weight-loss  goals.  So first try only eating between 12pm to 8pm.  Outside of this time you may have water, black coffee, and hot tea. You may not eat it drink anything that has carbs, sugars, OR artificial sugars like diet soda.   Like any major eating and fitness shift, it can take time to find the perfect fit, so don't be afraid to experiment with different options - including ditching intermittent fasting altogether if it's simply not for you. But if it is, you may be surprised by some of the benefits that come along with the strategy.

## 2017-08-17 LAB — TSH: TSH: 1.73 mIU/L (ref 0.40–4.50)

## 2017-08-17 LAB — HEMOGLOBIN A1C
HEMOGLOBIN A1C: 6 %{Hb} — AB (ref <5.7)
Mean Plasma Glucose: 126 (calc)
eAG (mmol/L): 7 (calc)

## 2017-08-17 LAB — CBC WITH DIFFERENTIAL/PLATELET
BASOS PCT: 0.5 %
Basophils Absolute: 37 cells/uL (ref 0–200)
EOS PCT: 2.6 %
Eosinophils Absolute: 190 cells/uL (ref 15–500)
HCT: 47.3 % (ref 38.5–50.0)
Hemoglobin: 15.9 g/dL (ref 13.2–17.1)
LYMPHS ABS: 2124 {cells}/uL (ref 850–3900)
MCH: 27.3 pg (ref 27.0–33.0)
MCHC: 33.6 g/dL (ref 32.0–36.0)
MCV: 81.1 fL (ref 80.0–100.0)
MPV: 11.5 fL (ref 7.5–12.5)
Monocytes Relative: 11 %
Neutro Abs: 4146 cells/uL (ref 1500–7800)
Neutrophils Relative %: 56.8 %
PLATELETS: 256 10*3/uL (ref 140–400)
RBC: 5.83 10*6/uL — AB (ref 4.20–5.80)
RDW: 15.7 % — ABNORMAL HIGH (ref 11.0–15.0)
TOTAL LYMPHOCYTE: 29.1 %
WBC: 7.3 10*3/uL (ref 3.8–10.8)
WBCMIX: 803 {cells}/uL (ref 200–950)

## 2017-08-17 LAB — BASIC METABOLIC PANEL WITH GFR
BUN: 14 mg/dL (ref 7–25)
CALCIUM: 9.2 mg/dL (ref 8.6–10.3)
CHLORIDE: 101 mmol/L (ref 98–110)
CO2: 27 mmol/L (ref 20–32)
Creat: 1.29 mg/dL (ref 0.60–1.35)
GFR, Est African American: 75 mL/min/{1.73_m2} (ref >=60)
GFR, Est Non African American: 65 mL/min/{1.73_m2} (ref >=60)
Glucose, Bld: 91 mg/dL (ref 65–99)
POTASSIUM: 3.5 mmol/L (ref 3.5–5.3)
Sodium: 139 mmol/L (ref 135–146)

## 2017-08-17 LAB — LIPID PANEL
CHOLESTEROL: 165 mg/dL (ref <200)
HDL: 47 mg/dL (ref >40)
LDL CHOLESTEROL (CALC): 97 mg/dL
Non-HDL Cholesterol (Calc): 118 mg/dL (calc) (ref <130)
TRIGLYCERIDES: 110 mg/dL (ref <150)
Total CHOL/HDL Ratio: 3.5 (calc) (ref <5.0)

## 2017-08-17 LAB — HEPATIC FUNCTION PANEL
AG Ratio: 1.9 (calc) (ref 1.0–2.5)
ALBUMIN MSPROF: 4.7 g/dL (ref 3.6–5.1)
ALT: 27 U/L (ref 9–46)
AST: 28 U/L (ref 10–40)
Alkaline phosphatase (APISO): 58 U/L (ref 40–115)
Bilirubin, Direct: 0.1 mg/dL (ref 0.0–0.2)
Globulin: 2.5 g/dL (calc) (ref 1.9–3.7)
Indirect Bilirubin: 0.5 mg/dL (calc) (ref 0.2–1.2)
TOTAL PROTEIN: 7.2 g/dL (ref 6.1–8.1)
Total Bilirubin: 0.6 mg/dL (ref 0.2–1.2)

## 2017-08-22 DIAGNOSIS — I10 Essential (primary) hypertension: Secondary | ICD-10-CM | POA: Diagnosis not present

## 2017-08-22 DIAGNOSIS — M542 Cervicalgia: Secondary | ICD-10-CM | POA: Diagnosis not present

## 2017-08-22 DIAGNOSIS — M4302 Spondylolysis, cervical region: Secondary | ICD-10-CM | POA: Diagnosis not present

## 2017-08-22 DIAGNOSIS — M25512 Pain in left shoulder: Secondary | ICD-10-CM | POA: Diagnosis not present

## 2017-08-22 DIAGNOSIS — M19012 Primary osteoarthritis, left shoulder: Secondary | ICD-10-CM | POA: Diagnosis not present

## 2017-08-23 DIAGNOSIS — M542 Cervicalgia: Secondary | ICD-10-CM | POA: Diagnosis not present

## 2017-08-23 DIAGNOSIS — M25512 Pain in left shoulder: Secondary | ICD-10-CM | POA: Diagnosis not present

## 2017-08-23 DIAGNOSIS — J3089 Other allergic rhinitis: Secondary | ICD-10-CM | POA: Diagnosis not present

## 2017-08-29 ENCOUNTER — Other Ambulatory Visit: Payer: Self-pay | Admitting: *Deleted

## 2017-08-29 MED ORDER — SILDENAFIL CITRATE 100 MG PO TABS: ORAL_TABLET | ORAL | 11 refills | Status: DC

## 2017-12-22 ENCOUNTER — Encounter: Payer: Self-pay | Admitting: *Deleted

## 2018-01-24 NOTE — Progress Notes (Signed)
FOLLOW UP  Assessment and Plan:   Hypertension Well controlled with current medications  Monitor blood pressure at home; patient to call if consistently greater than 130/80 Continue DASH diet.   Reminder to go to the ER if any CP, SOB, nausea, dizziness, severe HA, changes vision/speech, left arm numbness and tingling and jaw pain.  Cholesterol Borderline elevated for several years; most recent check at goal by lifestyle modification  Continue low cholesterol diet and exercise.  Check lipid panel.   Prediabetes Continue diet and exercise.  Perform daily foot/skin check, notify office of any concerning changes.  Check A1C  Obesity with co morbidities Long discussion about weight loss, diet, and exercise Recommended diet heavy in fruits and veggies and low in animal meats, cheeses, and dairy products, appropriate calorie intake Discussed ideal weight for height  Patient will work on checking fat percentage, start tracking calories/macros on app, advised to decreased fruit juice/sweet tea  Will follow up in 3 months  Vitamin D Def Below goal at last visit; has increased supplement to reach goal of 70-100 Check Vit D level  Testosterone deficiency Previously on supplementation, has been working on weight loss and exercise Check testosterone today Sildenafil not working well, requests to try cialis as insurance will apparently cover this - risks and SE discussed  Continue diet and meds as discussed. Further disposition pending results of labs. Discussed med's effects and SE's.   Over 30 minutes of exam, counseling, chart review, and critical decision making was performed.   Future Appointments  Date Time Provider [place]  05/11/2018  3:00 PM [name], NP [facility] None    ----------------------------------------------------------------------------------------------------------------------  HPI 52 y.o. male  presents for 6 month follow up on hypertension,  cholesterol, prediabetes, obesity, hypogonadism and vitamin D deficiency. He is being followed [facility] for frequent headaches; [facility] which [facility] reports has helped. He is diagnosed with ? Meniere's disease and is followed as needed by [facility]- currently managed by lifestyle.   BMI is Body mass index is 34.57 kg/m., he has been working on diet and exercise - walks 6-7 miles daily.  Wt Readings from Last 3 Encounters:  01/26/18 262 lb (118.8 kg)  08/16/17 261 lb (118.4 kg)  05/10/17 258 lb (117 kg)  Breakfast: skips, will drink cranberry juice Lunch: grilled chicken salad - feta cheese, lettuce, green dressing, chicken, has with sweet tea Dinner: Varies,   His blood pressure has been controlled at home, today their BP is BP: 122/82  He does workout. He denies chest pain, shortness of breath, dizziness.   He is not on cholesterol medication and denies myalgias. His cholesterol is at goal. The cholesterol last visit was:   Lab Results  Component Value Date   CHOL 165 08/16/2017   HDL 47 08/16/2017   LDLCALC 97 08/16/2017   TRIG 110 08/16/2017   CHOLHDL 3.5 08/16/2017    He has been working on diet and exercise for prediabetes, and denies foot ulcerations, increased appetite, nausea, paresthesia of the feet, polydipsia, polyuria, visual disturbances, vomiting and weight loss. Last A1C in the office was:  Lab Results  Component Value Date   HGBA1C 6.0 (H) 08/16/2017   Patient is on Vitamin D supplement but remains well below goal of 70 at most recent check, currently taking 5000 IU daily but forgetting to take regularly:   Lab Results  Component Value Date   VD25OH 37 05/10/2017     He has a history  of testosterone deficiency but is not currently on supplement, was recommended weight loss and zinc supplement:   Lab Results  Component Value Date   TESTOSTERONE 281 03/23/2016     Current Medications:  Current Outpatient Medications on File Prior to Visit   Medication Sig  . cetirizine (ZYRTEC) 10 MG tablet Take 1 tablet (10 mg total) by mouth daily. (Patient taking differently: Take 10 mg by mouth as needed. )  . Cholecalciferol (VITAMIN D-3) 5000 UNITS TABS Take 10,000 Units by mouth every other day.   . lisinopril (PRINIVIL,ZESTRIL) 20 MG tablet Take 1 tablet (20 mg total) by mouth daily.  . Magnesium 500 MG CAPS Take 500 mg by mouth daily.  Marland Kitchen OVER THE COUNTER MEDICATION as needed. OTC Acid Reducer  . SUMAtriptan (IMITREX) 25 MG tablet Take 25 mg by mouth as needed for migraine. May repeat in 2 hours if headache persists or recurs.   No current facility-administered medications on file prior to visit.      Allergies:  Allergies  Allergen Reactions  . Ziac [Bisoprolol-Hydrochlorothiazide]     Erectile dysfunction     Medical History:  Past Medical History:  Diagnosis Date  . 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 GERD (gastroesophageal reflux disease)   . 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 Hyperlipidemia   . Hypertension   . Hypogonadism male   . Lumbar spinal stenosis 07/31/2014  . Pre-diabetes   . PTSD (post-traumatic stress disorder)   . Sleep apnea    pt does not use CPAP  . Tinnitus   . Vertigo    Family history- Reviewed and unchanged Social history- Reviewed and unchanged   Review of Systems:  Review of Systems  Constitutional: Negative for malaise/fatigue and weight loss.  HENT: Negative for hearing loss and tinnitus.   Eyes: Negative for blurred vision and double vision.  Respiratory: Negative for cough, shortness of breath and wheezing.   Cardiovascular: Negative for chest pain, palpitations, orthopnea, claudication and leg swelling.  Gastrointestinal: Negative for abdominal pain, blood in stool, constipation, diarrhea, heartburn, melena, nausea and vomiting.  Genitourinary: Negative.   Musculoskeletal: Negative for joint  pain and myalgias.  Skin: Negative for rash.  Neurological: Negative for dizziness, tingling, sensory change, weakness and headaches.  Endo/Heme/Allergies: Negative for polydipsia.  Psychiatric/Behavioral: Negative.   All other systems reviewed and are negative.   Physical Exam: BP 122/82   Pulse 81   Temp (!) 97.3 F (36.3 C)   Ht 6\' 1"  (1.854 m)   Wt 262 lb (118.8 kg)   SpO2 96%   BMI 34.57 kg/m  Wt Readings from Last 3 Encounters:  01/26/18 262 lb (118.8 kg)  08/16/17 261 lb (118.4 kg)  05/10/17 258 lb (117 kg)   General Appearance: Well nourished, in no apparent distress. Eyes: PERRLA, EOMs, conjunctiva no swelling or erythema Sinuses: No Frontal/maxillary tenderness ENT/Mouth: Ext aud canals clear, TMs without erythema, bulging. No erythema, swelling, or exudate on post pharynx.  Tonsils not swollen or erythematous. Hearing normal.  Neck: Supple, thyroid normal.  Respiratory: Respiratory effort normal, BS equal bilaterally without rales, rhonchi, wheezing or stridor.  Cardio: RRR with no MRGs. Brisk peripheral pulses without edema.  Abdomen: Soft, + BS.  Non tender, no guarding, rebound, hernias, masses. Lymphatics: Non tender without lymphadenopathy.  Musculoskeletal: Full ROM, 5/5 strength, Normal gait Skin: Warm,  dry without rashes, lesions, ecchymosis.  Neuro: Cranial nerves intact. No cerebellar symptoms.  Psych: Awake and oriented X 3, normal affect, Insight and Judgment appropriate.    Izora Ribas, NP 9:36 AM [facility]

## 2018-01-26 ENCOUNTER — Encounter: Payer: Self-pay | Admitting: Adult Health

## 2018-01-26 ENCOUNTER — Ambulatory Visit (INDEPENDENT_AMBULATORY_CARE_PROVIDER_SITE_OTHER): Payer: 59 | Admitting: Adult Health

## 2018-01-26 VITALS — BP 122/82 | HR 81 | Temp 97.3°F | Ht 73.0 in | Wt 262.0 lb

## 2018-01-26 DIAGNOSIS — Z79899 Other long term (current) drug therapy: Secondary | ICD-10-CM | POA: Diagnosis not present

## 2018-01-26 DIAGNOSIS — R7303 Prediabetes: Secondary | ICD-10-CM

## 2018-01-26 DIAGNOSIS — E782 Mixed hyperlipidemia: Secondary | ICD-10-CM | POA: Diagnosis not present

## 2018-01-26 DIAGNOSIS — E349 Endocrine disorder, unspecified: Secondary | ICD-10-CM

## 2018-01-26 DIAGNOSIS — E669 Obesity, unspecified: Secondary | ICD-10-CM

## 2018-01-26 DIAGNOSIS — E559 Vitamin D deficiency, unspecified: Secondary | ICD-10-CM | POA: Diagnosis not present

## 2018-01-26 DIAGNOSIS — I10 Essential (primary) hypertension: Secondary | ICD-10-CM

## 2018-01-26 MED ORDER — TADALAFIL 20 MG PO TABS: ORAL_TABLET | ORAL | 0 refills | Status: DC

## 2018-01-26 NOTE — Patient Instructions (Signed)
Goals    . Weight (lb) < 245 lb (111.1 kg)      Recommend getting a fat monitor- the omron off of [email or web address] is about~$25 and pretty good  Recommend tracking your food/drinks via an app for a while to get an idea why you might be having difficulty losing weight - recommend Lose it! Or MyFitnessPAL   Know what a healthy weight is for you (roughly BMI <25) and aim to achieve and maintain this  Aim for 5-10+ servings of fruits and vegetables daily  65-80+ fluid ounces of water or unsweet tea for healthy kidneys  Limit to max 1 drink of alcohol per day; avoid smoking/tobacco  Limit animal fats in diet for cholesterol and heart health - choose grass fed whenever available  Avoid highly processed foods, and foods high in saturated/trans fats  Aim for low stress - take time to unwind and care for your mental health  Aim for 150 min of moderate intensity exercise weekly for heart health, and weights twice weekly for bone health  Aim for 7-9 hours of sleep daily   Tadalafil tablets (Cialis) What is this medicine? TADALAFIL (tah DA la fil) is used to treat erection problems in men. It is also used for enlargement of the prostate gland in men, a condition called benign prostatic hyperplasia or BPH. This medicine improves urine flow and reduces BPH symptoms. This medicine can also treat both erection problems and BPH when they occur together. This medicine may be used for other purposes; ask your health care provider or pharmacist if you have questions. COMMON BRAND NAME(S): Cialis What should I tell my health care provider before I take this medicine? They need to know if you have any of these conditions: -bleeding disorders -eye or vision problems, including a rare inherited eye disease called retinitis pigmentosa -anatomical deformation of the penis, Peyronie's disease, or history of priapism (painful and prolonged erection) -heart disease, angina, a history of heart attack, irregular  heart beats, or other heart problems -high or low blood pressure -history of blood diseases, like sickle cell anemia or leukemia -history of stomach bleeding -kidney disease -liver disease -stroke -an unusual or allergic reaction to tadalafil, other medicines, foods, dyes, or preservatives -pregnant or trying to get pregnant -breast-feeding How should I use this medicine? Take this medicine by mouth with a glass of water. Follow the directions on the prescription label. You may take this medicine with or without meals. When this medicine is used for erection problems, your doctor may prescribe it to be taken once daily or as needed. If you are taking the medicine as needed, you may be able to have sexual activity 30 minutes after taking it and for up to 36 hours after taking it. Whether you are taking the medicine as needed or once daily, you should not take more than one dose per day. If you are taking this medicine for symptoms of benign prostatic hyperplasia (BPH) or to treat both BPH and an erection problem, take the dose once daily at about the same time each day. Do not take your medicine more often than directed. Talk to your pediatrician regarding the use of this medicine in children. Special care may be needed. Overdosage: If you think you have taken too much of this medicine contact a poison control center or emergency room at once. NOTE: This medicine is only for you. Do not share this medicine with others. What if I miss a dose? If you are taking this  medicine as needed for erection problems, this does not apply. If you miss a dose while taking this medicine once daily for an erection problem, benign prostatic hyperplasia, or both, take it as soon as you remember, but do not take more than one dose per day. What may interact with this medicine? Do not take this medicine with any of the following medications: -nitrates like amyl nitrite, isosorbide dinitrate, isosorbide mononitrate,  nitroglycerin -other medicines for erectile dysfunction like avanafil, sildenafil, vardenafil -other tadalafil products (Adcirca) -riociguat This medicine may also interact with the following medications: -certain drugs for high blood pressure -certain drugs for the treatment of HIV infection or AIDS -certain drugs used for fungal or yeast infections, like fluconazole, itraconazole, ketoconazole, and voriconazole -certain drugs used for seizures like carbamazepine, phenytoin, and phenobarbital -grapefruit juice -macrolide antibiotics like clarithromycin, erythromycin, troleandomycin -medicines for prostate problems -rifabutin, rifampin or rifapentine This list may not describe all possible interactions. Give your health care provider a list of all the medicines, herbs, non-prescription drugs, or dietary supplements you use. Also tell them if you smoke, drink alcohol, or use illegal drugs. Some items may interact with your medicine. What should I watch for while using this medicine? If you notice any changes in your vision while taking this drug, call your doctor or health care professional as soon as possible. Stop using this medicine and call your health care provider right away if you have a loss of sight in one or both eyes. Contact your doctor or health care professional right away if the erection lasts longer than 4 hours or if it becomes painful. This may be a sign of serious problem and must be treated right away to prevent permanent damage. If you experience symptoms of nausea, dizziness, chest pain or arm pain upon initiation of sexual activity after taking this medicine, you should refrain from further activity and call your doctor or health care professional as soon as possible. Do not drink alcohol to excess (examples, 5 glasses of wine or 5 shots of whiskey) when taking this medicine. When taken in excess, alcohol can increase your chances of getting a headache or getting dizzy,  increasing your heart rate or lowering your blood pressure. Using this medicine does not protect you or your partner against HIV infection (the virus that causes AIDS) or other sexually transmitted diseases. What side effects may I notice from receiving this medicine? Side effects that you should report to your doctor or health care professional as soon as possible: -allergic reactions like skin rash, itching or hives, swelling of the face, lips, or tongue -breathing problems -changes in hearing -changes in vision -chest pain -fast, irregular heartbeat -prolonged or painful erection -seizures Side effects that usually do not require medical attention (report to your doctor or health care professional if they continue or are bothersome): -back pain -dizziness -flushing -headache -indigestion -muscle aches -nausea -stuffy or runny nose This list may not describe all possible side effects. Call your doctor for medical advice about side effects. You may report side effects to FDA at 1-800-FDA-1088. Where should I keep my medicine? Keep out of the reach of children. Store at room temperature between 15 and 30 degrees C (59 and 86 degrees F). Throw away any unused medicine after the expiration date. NOTE: This sheet is a summary. It may not cover all possible information. If you have questions about this medicine, talk to your doctor, pharmacist, or health care provider.  2018 [employer]vier/Gold Standard (2013-09-28 13:15:49)

## 2018-01-26 NOTE — Addendum Note (Signed)
Addended by: Chancy Hurter on: 01/26/2018 10:14 AM   Modules accepted: Orders

## 2018-01-27 LAB — CBC WITH DIFFERENTIAL/PLATELET
BASOS ABS: 43 {cells}/uL (ref 0–200)
Basophils Relative: 0.6 %
EOS ABS: 199 {cells}/uL (ref 15–500)
Eosinophils Relative: 2.8 %
HEMATOCRIT: 48.5 % (ref 38.5–50.0)
HEMOGLOBIN: 15.9 g/dL (ref 13.2–17.1)
LYMPHS ABS: 2038 {cells}/uL (ref 850–3900)
MCH: 26.9 pg — AB (ref 27.0–33.0)
MCHC: 32.8 g/dL (ref 32.0–36.0)
MCV: 81.9 fL (ref 80.0–100.0)
MONOS PCT: 8.8 %
MPV: 11.5 fL (ref 7.5–12.5)
NEUTROS ABS: 4196 {cells}/uL (ref 1500–7800)
Neutrophils Relative %: 59.1 %
Platelets: 264 10*3/uL (ref 140–400)
RBC: 5.92 10*6/uL — ABNORMAL HIGH (ref 4.20–5.80)
RDW: 14.5 % (ref 11.0–15.0)
Total Lymphocyte: 28.7 %
WBC: 7.1 10*3/uL (ref 3.8–10.8)
WBCMIX: 625 {cells}/uL (ref 200–950)

## 2018-01-27 LAB — COMPLETE METABOLIC PANEL WITH GFR
AG RATIO: 1.5 (calc) (ref 1.0–2.5)
ALBUMIN MSPROF: 4.4 g/dL (ref 3.6–5.1)
ALKALINE PHOSPHATASE (APISO): 59 U/L (ref 40–115)
ALT: 36 U/L (ref 9–46)
AST: 43 U/L — ABNORMAL HIGH (ref 10–35)
BILIRUBIN TOTAL: 0.6 mg/dL (ref 0.2–1.2)
BUN: 12 mg/dL (ref 7–25)
CO2: 26 mmol/L (ref 20–32)
Calcium: 9.6 mg/dL (ref 8.6–10.3)
Chloride: 102 mmol/L (ref 98–110)
Creat: 1.21 mg/dL (ref 0.70–1.33)
GFR, Est African American: 80 mL/min/{1.73_m2} (ref >=60)
GFR, Est Non African American: 69 mL/min/{1.73_m2} (ref >=60)
Globulin: 3 g/dL (calc) (ref 1.9–3.7)
Glucose, Bld: 102 mg/dL — ABNORMAL HIGH (ref 65–99)
Potassium: 3.7 mmol/L (ref 3.5–5.3)
Sodium: 138 mmol/L (ref 135–146)
Total Protein: 7.4 g/dL (ref 6.1–8.1)

## 2018-01-27 LAB — LIPID PANEL
Cholesterol: 168 mg/dL (ref <200)
HDL: 42 mg/dL (ref >40)
LDL CHOLESTEROL (CALC): 104 mg/dL — AB
Non-HDL Cholesterol (Calc): 126 mg/dL (calc) (ref <130)
Total CHOL/HDL Ratio: 4 (calc) (ref <5.0)
Triglycerides: 119 mg/dL (ref <150)

## 2018-01-27 LAB — TSH: TSH: 1.91 m[IU]/L (ref 0.40–4.50)

## 2018-01-27 LAB — TESTOSTERONE: Testosterone: 452 ng/dL (ref 250–827)

## 2018-01-27 LAB — VITAMIN D 25 HYDROXY (VIT D DEFICIENCY, FRACTURES): VIT D 25 HYDROXY: 35 ng/mL (ref 30–100)

## 2018-01-27 LAB — HEMOGLOBIN A1C
HEMOGLOBIN A1C: 5.9 %{Hb} — AB (ref <5.7)
MEAN PLASMA GLUCOSE: 123 (calc)
eAG (mmol/L): 6.8 (calc)

## 2018-02-27 DIAGNOSIS — M9903 Segmental and somatic dysfunction of lumbar region: Secondary | ICD-10-CM | POA: Diagnosis not present

## 2018-02-27 DIAGNOSIS — M5136 Other intervertebral disc degeneration, lumbar region: Secondary | ICD-10-CM | POA: Diagnosis not present

## 2018-02-27 DIAGNOSIS — M47816 Spondylosis without myelopathy or radiculopathy, lumbar region: Secondary | ICD-10-CM | POA: Diagnosis not present

## 2018-03-01 DIAGNOSIS — M47816 Spondylosis without myelopathy or radiculopathy, lumbar region: Secondary | ICD-10-CM | POA: Diagnosis not present

## 2018-03-01 DIAGNOSIS — M5136 Other intervertebral disc degeneration, lumbar region: Secondary | ICD-10-CM | POA: Diagnosis not present

## 2018-03-01 DIAGNOSIS — M9903 Segmental and somatic dysfunction of lumbar region: Secondary | ICD-10-CM | POA: Diagnosis not present

## 2018-03-07 DIAGNOSIS — S8002XA Contusion of left knee, initial encounter: Secondary | ICD-10-CM | POA: Diagnosis not present

## 2018-03-07 DIAGNOSIS — W2203XA Walked into furniture, initial encounter: Secondary | ICD-10-CM | POA: Diagnosis not present

## 2018-03-07 DIAGNOSIS — M1712 Unilateral primary osteoarthritis, left knee: Secondary | ICD-10-CM | POA: Diagnosis not present

## 2018-03-07 DIAGNOSIS — Z23 Encounter for immunization: Secondary | ICD-10-CM | POA: Diagnosis not present

## 2018-03-13 DIAGNOSIS — M9903 Segmental and somatic dysfunction of lumbar region: Secondary | ICD-10-CM | POA: Diagnosis not present

## 2018-03-13 DIAGNOSIS — M47816 Spondylosis without myelopathy or radiculopathy, lumbar region: Secondary | ICD-10-CM | POA: Diagnosis not present

## 2018-03-13 DIAGNOSIS — M5136 Other intervertebral disc degeneration, lumbar region: Secondary | ICD-10-CM | POA: Diagnosis not present

## 2018-03-17 DIAGNOSIS — M9903 Segmental and somatic dysfunction of lumbar region: Secondary | ICD-10-CM | POA: Diagnosis not present

## 2018-03-17 DIAGNOSIS — M47816 Spondylosis without myelopathy or radiculopathy, lumbar region: Secondary | ICD-10-CM | POA: Diagnosis not present

## 2018-03-17 DIAGNOSIS — M5136 Other intervertebral disc degeneration, lumbar region: Secondary | ICD-10-CM | POA: Diagnosis not present

## 2018-03-20 DIAGNOSIS — M9903 Segmental and somatic dysfunction of lumbar region: Secondary | ICD-10-CM | POA: Diagnosis not present

## 2018-03-20 DIAGNOSIS — M5136 Other intervertebral disc degeneration, lumbar region: Secondary | ICD-10-CM | POA: Diagnosis not present

## 2018-03-20 DIAGNOSIS — M47816 Spondylosis without myelopathy or radiculopathy, lumbar region: Secondary | ICD-10-CM | POA: Diagnosis not present

## 2018-03-22 DIAGNOSIS — M9903 Segmental and somatic dysfunction of lumbar region: Secondary | ICD-10-CM | POA: Diagnosis not present

## 2018-03-22 DIAGNOSIS — M47816 Spondylosis without myelopathy or radiculopathy, lumbar region: Secondary | ICD-10-CM | POA: Diagnosis not present

## 2018-03-22 DIAGNOSIS — M5136 Other intervertebral disc degeneration, lumbar region: Secondary | ICD-10-CM | POA: Diagnosis not present

## 2018-03-27 DIAGNOSIS — M9903 Segmental and somatic dysfunction of lumbar region: Secondary | ICD-10-CM | POA: Diagnosis not present

## 2018-03-27 DIAGNOSIS — M47816 Spondylosis without myelopathy or radiculopathy, lumbar region: Secondary | ICD-10-CM | POA: Diagnosis not present

## 2018-03-27 DIAGNOSIS — M5136 Other intervertebral disc degeneration, lumbar region: Secondary | ICD-10-CM | POA: Diagnosis not present

## 2018-03-30 DIAGNOSIS — M9903 Segmental and somatic dysfunction of lumbar region: Secondary | ICD-10-CM | POA: Diagnosis not present

## 2018-03-30 DIAGNOSIS — M5136 Other intervertebral disc degeneration, lumbar region: Secondary | ICD-10-CM | POA: Diagnosis not present

## 2018-03-30 DIAGNOSIS — M47816 Spondylosis without myelopathy or radiculopathy, lumbar region: Secondary | ICD-10-CM | POA: Diagnosis not present

## 2018-03-31 DIAGNOSIS — W19XXXA Unspecified fall, initial encounter: Secondary | ICD-10-CM | POA: Diagnosis not present

## 2018-03-31 DIAGNOSIS — S8992XA Unspecified injury of left lower leg, initial encounter: Secondary | ICD-10-CM | POA: Diagnosis not present

## 2018-04-07 DIAGNOSIS — M5136 Other intervertebral disc degeneration, lumbar region: Secondary | ICD-10-CM | POA: Diagnosis not present

## 2018-04-07 DIAGNOSIS — M47816 Spondylosis without myelopathy or radiculopathy, lumbar region: Secondary | ICD-10-CM | POA: Diagnosis not present

## 2018-04-07 DIAGNOSIS — M9903 Segmental and somatic dysfunction of lumbar region: Secondary | ICD-10-CM | POA: Diagnosis not present

## 2018-04-13 DIAGNOSIS — M5136 Other intervertebral disc degeneration, lumbar region: Secondary | ICD-10-CM | POA: Diagnosis not present

## 2018-04-13 DIAGNOSIS — M47816 Spondylosis without myelopathy or radiculopathy, lumbar region: Secondary | ICD-10-CM | POA: Diagnosis not present

## 2018-04-13 DIAGNOSIS — M9903 Segmental and somatic dysfunction of lumbar region: Secondary | ICD-10-CM | POA: Diagnosis not present

## 2018-04-29 DIAGNOSIS — R05 Cough: Secondary | ICD-10-CM | POA: Diagnosis not present

## 2018-04-29 DIAGNOSIS — J018 Other acute sinusitis: Secondary | ICD-10-CM | POA: Diagnosis not present

## 2018-05-10 ENCOUNTER — Ambulatory Visit: Payer: 59 | Admitting: Adult Health

## 2018-05-10 ENCOUNTER — Encounter: Payer: Self-pay | Admitting: Adult Health

## 2018-05-10 VITALS — BP 122/90 | HR 76 | Temp 97.5°F | Ht 73.0 in | Wt 261.4 lb

## 2018-05-10 DIAGNOSIS — R7303 Prediabetes: Secondary | ICD-10-CM

## 2018-05-10 DIAGNOSIS — I10 Essential (primary) hypertension: Secondary | ICD-10-CM

## 2018-05-10 DIAGNOSIS — Z136 Encounter for screening for cardiovascular disorders: Secondary | ICD-10-CM

## 2018-05-10 DIAGNOSIS — Z0000 Encounter for general adult medical examination without abnormal findings: Secondary | ICD-10-CM

## 2018-05-10 DIAGNOSIS — Z85528 Personal history of other malignant neoplasm of kidney: Secondary | ICD-10-CM | POA: Insufficient documentation

## 2018-05-10 DIAGNOSIS — N529 Male erectile dysfunction, unspecified: Secondary | ICD-10-CM

## 2018-05-10 DIAGNOSIS — E559 Vitamin D deficiency, unspecified: Secondary | ICD-10-CM

## 2018-05-10 DIAGNOSIS — E349 Endocrine disorder, unspecified: Secondary | ICD-10-CM

## 2018-05-10 DIAGNOSIS — H8109 Meniere's disease, unspecified ear: Secondary | ICD-10-CM

## 2018-05-10 DIAGNOSIS — Z125 Encounter for screening for malignant neoplasm of prostate: Secondary | ICD-10-CM

## 2018-05-10 DIAGNOSIS — E782 Mixed hyperlipidemia: Secondary | ICD-10-CM | POA: Diagnosis not present

## 2018-05-10 DIAGNOSIS — Z79899 Other long term (current) drug therapy: Secondary | ICD-10-CM

## 2018-05-10 DIAGNOSIS — K2100 Gastro-esophageal reflux disease with esophagitis, without bleeding: Secondary | ICD-10-CM

## 2018-05-10 DIAGNOSIS — G4733 Obstructive sleep apnea (adult) (pediatric): Secondary | ICD-10-CM

## 2018-05-10 DIAGNOSIS — M26609 Unspecified temporomandibular joint disorder, unspecified side: Secondary | ICD-10-CM

## 2018-05-10 DIAGNOSIS — E669 Obesity, unspecified: Secondary | ICD-10-CM

## 2018-05-10 DIAGNOSIS — N400 Enlarged prostate without lower urinary tract symptoms: Secondary | ICD-10-CM

## 2018-05-10 MED ORDER — TADALAFIL 20 MG PO TABS: ORAL_TABLET | ORAL | 0 refills | Status: DC

## 2018-05-10 NOTE — Progress Notes (Signed)
Complete Physical  Assessment and Plan:  Diagnoses and all orders for this visit:  Routine general medical examination at a health care facility  Essential hypertension Continue medication Monitor blood pressure at home; call if consistently over 130/80 Continue DASH diet.   Reminder to go to the ER if any CP, SOB, nausea, dizziness, severe HA, changes vision/speech, left arm numbness and tingling and jaw pain.  OSA (obstructive sleep apnea) Sleep apnea- continue CPAP, CPAP is helping with daytime fatigue, weight loss still advised. He has sleep study scheduled through [place]  GERD Well managed on current medications -OTC PRN Discussed diet, avoiding triggers and other lifestyle changes  Meniere's disease, unspecified laterality Suspected diagnosis by [facility] ENT/audiology after episodes of decreased hearing, vertigo and syncope. Reportedly currently treated by lifestyle modification; surgery was discussed but declined at this time. No further episodes of syncope.   Hyperlipidemia Currently at goal with lifestyle modification -     Lipid panel -     TSH  Prediabetes Discussed disease and risks Discussed diet/exercise, weight management  -     Hemoglobin A1c  Vitamin D deficiency Continue supplementation  -     VITAMIN D 25 Hydroxy (Vit-D Deficiency, Fractures)  Medication management -     CBC with Differential/Platelet -     BASIC METABOLIC PANEL WITH GFR -     Hepatic function panel  Obesity (BMI 30.0-34.9) Long discussion about weight loss, diet, and exercise Recommended diet heavy in fruits and veggies and low in animal meats, cheeses, and dairy products, appropriate calorie intake Discussed appropriate weight for height  Follow up at next visit  Spinal stenosis of lumbar region, unspecified whether neurogenic claudication present       Doing well post operation with Dr. Tonita Cong  Screening for cardiovascular condition -     EKG 12-Lead  History of L kidney cancer S/p  partial nephrectomy, was followed annually by Dr. Gaynelle Arabian until retirement then lost to follow up and hasn't been in last 2 years. Will refer back for monitoring and to clarify recommendations for ongoing monitoring needs.   BPH without symptoms Per Dr. Arlyn Leak notes; denies symptoms; monitor Will refer back to urology  Erectile dysfunction Doing well with cialis; refilled today; follow up urology  Discussed med's effects and SE's. Screening labs and tests as requested with regular follow-up as recommended. Over 40 minutes of exam, counseling, chart review and critical decision making was performed  Future Appointments  Date Time Provider [place]  08/18/2018  9:15 AM [name], NP [facility] None  05/15/2019  3:00 PM [name], NP [facility] None     HPI 52 y/o AA male - retired early after working for [employer], separated with two grown children. Patient presents for a complete physical. has Essential hypertension; Hyperlipidemia; Prediabetes; Vitamin D deficiency; OSA (obstructive sleep apnea); Medication management; GERD; TMJ (temporomandibular joint syndrome); Obesity (BMI 30.0-34.9); Meniere disease; Testosterone deficiency; BPH (benign prostatic hyperplasia); and History of kidney cancer on their problem list.   He is being followed by [facility] for frequent headaches; he was prescribed topamax/flexeril at our last visit but reports this was not helpful; [facility] which [facility]. He is diagnosed with Meniere's disease and is followed as needed by [facility]- currently managed by lifestyle.   He reports he has a sleep study scheduled through the [place].   Today he reports he has history of L kidney cancer, s/p partial nephrectomy in ? 2008,  which was followed by Dr. Gaynelle Arabian annually until he retired, then apparently patient was lost to follow up and hasn't been seen recently.   BMI is Body mass index is 34.49 kg/m.,  he has been working on diet and exercise- he reports he walks 5 miles a day as weather allows which also helps his back stiffness significantly.   Wt Readings from Last 3 Encounters:  05/10/18 261 lb 6.4 oz (118.6 kg)  01/26/18 262 lb (118.8 kg)  08/16/17 261 lb (118.4 kg)   His blood pressure has been controlled at home, today their BP is BP: 122/90 He does workout. He denies chest pain, shortness of breath, dizziness.   He is not on cholesterol medication and denies myalgias. His cholesterol is at goal. The cholesterol last visit was:   Lab Results  Component Value Date   CHOL 168 01/26/2018   HDL 42 01/26/2018   LDLCALC 104 (H) 01/26/2018   TRIG 119 01/26/2018   CHOLHDL 4.0 01/26/2018   He has been working on diet and exercise for prediabetes, he is not on bASA, he is on ACE/ARB and denies increased appetite, nausea, paresthesia of the feet, polydipsia, polyuria, visual disturbances and vomiting. Last A1C in the office was:  Lab Results  Component Value Date   HGBA1C 5.9 (H) 01/26/2018   Last GFR:   Lab Results  Component Value Date   GFRAA 80 01/26/2018   Patient is on Vitamin D supplement but below goal at last check:    Lab Results  Component Value Date   VD25OH 35 01/26/2018     Last PSA was: Lab Results  Component Value Date   PSA 0.77 02/11/2015  He endorses some erectile dysfunction for which he uses 20 mg cialis; he has BPH   Current Medications:  Current Outpatient Medications on File Prior to Visit  Medication Sig Dispense Refill  . cetirizine (ZYRTEC) 10 MG tablet Take 1 tablet (10 mg total) by mouth daily. (Patient taking differently: Take 10 mg by mouth as needed. ) 90 tablet 4  . Cholecalciferol (VITAMIN D-3) 5000 UNITS TABS Take 10,000 Units by mouth daily.     Marland Kitchen lisinopril (PRINIVIL,ZESTRIL) 20 MG tablet Take 1 tablet (20 mg total) by mouth daily. 30 tablet 0  . nortriptyline (PAMELOR) 25 MG capsule Take 25 mg by mouth at bedtime.    Marland Kitchen OVER THE COUNTER  MEDICATION as needed. OTC Acid Reducer    . SUMAtriptan (IMITREX) 25 MG tablet Take 25 mg by mouth daily. May repeat in 2 hours if headache persists or recurs.     . Zinc 50 MG TABS Take by mouth daily.    Marland Kitchen zolmitriptan (ZOMIG) 5 MG tablet Take 5 mg by mouth as needed for migraine.    . Magnesium 500 MG CAPS Take 500 mg by mouth daily.     No current facility-administered medications on file prior to visit.    Allergies:  Allergies  Allergen Reactions  . Ziac [Bisoprolol-Hydrochlorothiazide]     Erectile dysfunction   Health Maintenance:  Immunization History  Administered Date(s) Administered  . PPD Test 12/18/2013, 02/11/2015  . Td 05/24/2010   Tetanus:2012 Pneumovax: n/a Prevnar 13: n/a Flu vaccine: 03/2018 Zostavax: n/a  DEXA: n/a Colonoscopy: 2018, due 2023 EGD: n/a  Eye Exam: Dr. Lucita Ferrara, hx of cataracts, now going to my eye care at [facility]  Dentist: Dr. Haynes Kerns, 2019   Patient Care Team: Unk Pinto, MD as PCP - General (Internal Medicine)  Medical History:  has Essential hypertension; Hyperlipidemia; Prediabetes; Vitamin D deficiency; OSA (obstructive sleep apnea); Medication management; GERD; TMJ (temporomandibular joint syndrome); Obesity (BMI 30.0-34.9); Meniere disease; Testosterone deficiency; BPH (benign prostatic hyperplasia); and History of kidney cancer on their problem list. Surgical History:  He  has a past surgical history that includes Total hip arthroplasty (Right, 2007); Kidney cyst removal (2008); Cataract extraction w/ intraocular lens implant (Left, 2014); Bunionectomy (Bilateral); Lumbar laminectomy/decompression microdiscectomy (Left, 07/31/2014); and Cataract extraction (Right). Family History:  His family history includes Breast cancer in his mother and paternal grandmother; Hypertension in his maternal grandmother. Social History:   reports that he has never smoked. He has never used smokeless tobacco. He reports that he does not drink  alcohol or use drugs. Review of Systems:  Review of Systems  Constitutional: Negative for malaise/fatigue and weight loss.  HENT: Negative for ear discharge, ear pain, hearing loss and tinnitus.   Eyes: Negative for blurred vision and double vision.  Respiratory: Negative for cough, shortness of breath and wheezing.   Cardiovascular: Negative for chest pain, palpitations, orthopnea, claudication and leg swelling.  Gastrointestinal: Negative for abdominal pain, blood in stool, constipation, diarrhea, heartburn, melena, nausea and vomiting.  Genitourinary: Negative.   Musculoskeletal: Negative for back pain (Endorses stiffness with extended immobility), falls, joint pain and myalgias.  Skin: Negative for rash.  Neurological: Positive for dizziness (Intermittent; vertigo related to Meniere's). Negative for tingling, sensory change, weakness and headaches.  Endo/Heme/Allergies: Negative for polydipsia.  Psychiatric/Behavioral: Negative.   All other systems reviewed and are negative.   Physical Exam: Estimated body mass index is 34.49 kg/m as calculated from the following:   Height as of this encounter: 6\' 1"  (1.854 m).   Weight as of this encounter: 261 lb 6.4 oz (118.6 kg). BP 122/90   Pulse 76   Temp (!) 97.5 F (36.4 C)   Ht 6\' 1"  (1.854 m)   Wt 261 lb 6.4 oz (118.6 kg)   SpO2 97%   BMI 34.49 kg/m  General Appearance: Well nourished, in no apparent distress.  Eyes: PERRLA, EOMs, conjunctiva no swelling or erythema, normal fundi and vessels.  Sinuses: No Frontal/maxillary tenderness  ENT/Mouth: Ext aud canals clear, normal light reflex with TMs without erythema, bulging. Good dentition. No erythema, swelling, or exudate on post pharynx. Tonsils not swollen or erythematous. Hearing normal.  Neck: Supple, thyroid normal. No bruits  Respiratory: Respiratory effort normal, BS equal bilaterally without rales, rhonchi, wheezing or stridor.  Cardio: RRR without murmurs, rubs or gallops.  Brisk peripheral pulses without edema.  Chest: symmetric, with normal excursions and percussion.  Abdomen: Soft, nontender, no guarding, rebound, hernias, masses, or organomegaly.  Lymphatics: Non tender without lymphadenopathy.  Genitourinary: Defer Musculoskeletal: Full ROM all peripheral extremities,5/5 strength, and normal gait.  Skin: Warm, dry without rashes, lesions, ecchymosis. Neuro: Cranial nerves intact, reflexes equal bilaterally. Normal muscle tone, no cerebellar symptoms. Sensation intact.  Psych: Awake and oriented X 3, normal affect, Insight and Judgment appropriate.   EKG: WNL no changes.  Gorden Harms Harvest Stanco 12:18 PM [facility]

## 2018-05-11 ENCOUNTER — Other Ambulatory Visit: Payer: Self-pay | Admitting: Adult Health

## 2018-05-11 ENCOUNTER — Encounter: Payer: Self-pay | Admitting: Adult Health

## 2018-05-11 LAB — PSA: PSA: 1.3 ng/mL (ref <=4.0)

## 2018-05-11 LAB — LIPID PANEL
Cholesterol: 181 mg/dL (ref <200)
HDL: 43 mg/dL (ref >40)
LDL Cholesterol (Calc): 116 mg/dL (calc) — ABNORMAL HIGH
Non-HDL Cholesterol (Calc): 138 mg/dL (calc) — ABNORMAL HIGH (ref <130)
TRIGLYCERIDES: 117 mg/dL (ref <150)
Total CHOL/HDL Ratio: 4.2 (calc) (ref <5.0)

## 2018-05-11 LAB — CBC WITH DIFFERENTIAL/PLATELET
Absolute Monocytes: 616 cells/uL (ref 200–950)
Basophils Absolute: 47 cells/uL (ref 0–200)
Basophils Relative: 0.7 %
EOS PCT: 2.4 %
Eosinophils Absolute: 161 cells/uL (ref 15–500)
HCT: 46.2 % (ref 38.5–50.0)
Hemoglobin: 15.5 g/dL (ref 13.2–17.1)
Lymphs Abs: 1916 cells/uL (ref 850–3900)
MCH: 27.5 pg (ref 27.0–33.0)
MCHC: 33.5 g/dL (ref 32.0–36.0)
MCV: 81.9 fL (ref 80.0–100.0)
MONOS PCT: 9.2 %
MPV: 11.5 fL (ref 7.5–12.5)
NEUTROS ABS: 3960 {cells}/uL (ref 1500–7800)
NEUTROS PCT: 59.1 %
PLATELETS: 261 10*3/uL (ref 140–400)
RBC: 5.64 10*6/uL (ref 4.20–5.80)
RDW: 14.5 % (ref 11.0–15.0)
TOTAL LYMPHOCYTE: 28.6 %
WBC: 6.7 10*3/uL (ref 3.8–10.8)

## 2018-05-11 LAB — COMPLETE METABOLIC PANEL WITH GFR
AG Ratio: 1.6 (calc) (ref 1.0–2.5)
ALBUMIN MSPROF: 4.4 g/dL (ref 3.6–5.1)
ALKALINE PHOSPHATASE (APISO): 64 U/L (ref 40–115)
ALT: 23 U/L (ref 9–46)
AST: 21 U/L (ref 10–35)
BILIRUBIN TOTAL: 0.4 mg/dL (ref 0.2–1.2)
BUN: 11 mg/dL (ref 7–25)
CHLORIDE: 103 mmol/L (ref 98–110)
CO2: 27 mmol/L (ref 20–32)
Calcium: 9.5 mg/dL (ref 8.6–10.3)
Creat: 1.18 mg/dL (ref 0.70–1.33)
GFR, Est African American: 83 mL/min/{1.73_m2} (ref >=60)
GFR, Est Non African American: 72 mL/min/{1.73_m2} (ref >=60)
GLUCOSE: 95 mg/dL (ref 65–99)
Globulin: 2.8 g/dL (calc) (ref 1.9–3.7)
Potassium: 3.7 mmol/L (ref 3.5–5.3)
Sodium: 139 mmol/L (ref 135–146)
Total Protein: 7.2 g/dL (ref 6.1–8.1)

## 2018-05-11 LAB — HEMOGLOBIN A1C
Hgb A1c MFr Bld: 5.9 % of total Hgb — ABNORMAL HIGH (ref <5.7)
Mean Plasma Glucose: 123 (calc)
eAG (mmol/L): 6.8 (calc)

## 2018-05-11 LAB — URINALYSIS, ROUTINE W REFLEX MICROSCOPIC
Bilirubin Urine: NEGATIVE
GLUCOSE, UA: NEGATIVE
Hgb urine dipstick: NEGATIVE
Ketones, ur: NEGATIVE
LEUKOCYTES UA: NEGATIVE
Nitrite: NEGATIVE
Protein, ur: NEGATIVE
Specific Gravity, Urine: 1.017 (ref 1.001–1.03)
pH: 5.5 (ref 5.0–8.0)

## 2018-05-11 LAB — MAGNESIUM: MAGNESIUM: 2.1 mg/dL (ref 1.5–2.5)

## 2018-05-11 LAB — TESTOSTERONE: Testosterone: 315 ng/dL (ref 250–827)

## 2018-05-11 LAB — TSH: TSH: 1.65 mIU/L (ref 0.40–4.50)

## 2018-05-11 LAB — MICROALBUMIN / CREATININE URINE RATIO
Creatinine, Urine: 149 mg/dL (ref 20–320)
Microalb Creat Ratio: 3 mcg/mg creat (ref <30)
Microalb, Ur: 0.4 mg/dL

## 2018-05-11 LAB — VITAMIN D 25 HYDROXY (VIT D DEFICIENCY, FRACTURES): Vit D, 25-Hydroxy: 41 ng/mL (ref 30–100)

## 2018-05-27 DIAGNOSIS — J018 Other acute sinusitis: Secondary | ICD-10-CM | POA: Diagnosis not present

## 2018-05-27 DIAGNOSIS — R0981 Nasal congestion: Secondary | ICD-10-CM | POA: Diagnosis not present

## 2018-05-27 DIAGNOSIS — H9203 Otalgia, bilateral: Secondary | ICD-10-CM | POA: Diagnosis not present

## 2018-06-05 ENCOUNTER — Encounter: Payer: Self-pay | Admitting: Internal Medicine

## 2018-06-19 DIAGNOSIS — I10 Essential (primary) hypertension: Secondary | ICD-10-CM | POA: Diagnosis not present

## 2018-07-13 DIAGNOSIS — G4733 Obstructive sleep apnea (adult) (pediatric): Secondary | ICD-10-CM | POA: Diagnosis not present

## 2018-07-19 DIAGNOSIS — M5136 Other intervertebral disc degeneration, lumbar region: Secondary | ICD-10-CM | POA: Diagnosis not present

## 2018-07-19 DIAGNOSIS — M9903 Segmental and somatic dysfunction of lumbar region: Secondary | ICD-10-CM | POA: Diagnosis not present

## 2018-07-19 DIAGNOSIS — M47816 Spondylosis without myelopathy or radiculopathy, lumbar region: Secondary | ICD-10-CM | POA: Diagnosis not present

## 2018-07-20 DIAGNOSIS — Z85528 Personal history of other malignant neoplasm of kidney: Secondary | ICD-10-CM | POA: Diagnosis not present

## 2018-07-20 DIAGNOSIS — R3911 Hesitancy of micturition: Secondary | ICD-10-CM | POA: Diagnosis not present

## 2018-07-20 DIAGNOSIS — N401 Enlarged prostate with lower urinary tract symptoms: Secondary | ICD-10-CM | POA: Diagnosis not present

## 2018-07-26 DIAGNOSIS — G4730 Sleep apnea, unspecified: Secondary | ICD-10-CM | POA: Diagnosis not present

## 2018-07-26 DIAGNOSIS — G4700 Insomnia, unspecified: Secondary | ICD-10-CM | POA: Diagnosis not present

## 2018-07-26 DIAGNOSIS — G43819 Other migraine, intractable, without status migrainosus: Secondary | ICD-10-CM | POA: Diagnosis not present

## 2018-07-27 DIAGNOSIS — M47816 Spondylosis without myelopathy or radiculopathy, lumbar region: Secondary | ICD-10-CM | POA: Diagnosis not present

## 2018-07-27 DIAGNOSIS — M9903 Segmental and somatic dysfunction of lumbar region: Secondary | ICD-10-CM | POA: Diagnosis not present

## 2018-07-27 DIAGNOSIS — M5136 Other intervertebral disc degeneration, lumbar region: Secondary | ICD-10-CM | POA: Diagnosis not present

## 2018-07-31 DIAGNOSIS — M9903 Segmental and somatic dysfunction of lumbar region: Secondary | ICD-10-CM | POA: Diagnosis not present

## 2018-07-31 DIAGNOSIS — M47816 Spondylosis without myelopathy or radiculopathy, lumbar region: Secondary | ICD-10-CM | POA: Diagnosis not present

## 2018-07-31 DIAGNOSIS — M5136 Other intervertebral disc degeneration, lumbar region: Secondary | ICD-10-CM | POA: Diagnosis not present

## 2018-08-08 DIAGNOSIS — M5136 Other intervertebral disc degeneration, lumbar region: Secondary | ICD-10-CM | POA: Diagnosis not present

## 2018-08-08 DIAGNOSIS — M47816 Spondylosis without myelopathy or radiculopathy, lumbar region: Secondary | ICD-10-CM | POA: Diagnosis not present

## 2018-08-08 DIAGNOSIS — M9903 Segmental and somatic dysfunction of lumbar region: Secondary | ICD-10-CM | POA: Diagnosis not present

## 2018-08-09 DIAGNOSIS — Z79899 Other long term (current) drug therapy: Secondary | ICD-10-CM | POA: Diagnosis not present

## 2018-08-14 ENCOUNTER — Encounter: Payer: Self-pay | Admitting: Adult Health

## 2018-08-14 ENCOUNTER — Ambulatory Visit: Payer: 59 | Admitting: Adult Health

## 2018-08-14 ENCOUNTER — Other Ambulatory Visit: Payer: Self-pay

## 2018-08-14 VITALS — BP 110/78 | HR 89 | Temp 98.1°F | Ht 73.0 in | Wt 266.0 lb

## 2018-08-14 DIAGNOSIS — E669 Obesity, unspecified: Secondary | ICD-10-CM

## 2018-08-14 DIAGNOSIS — Z79899 Other long term (current) drug therapy: Secondary | ICD-10-CM

## 2018-08-14 DIAGNOSIS — R7303 Prediabetes: Secondary | ICD-10-CM

## 2018-08-14 DIAGNOSIS — E559 Vitamin D deficiency, unspecified: Secondary | ICD-10-CM

## 2018-08-14 DIAGNOSIS — E349 Endocrine disorder, unspecified: Secondary | ICD-10-CM

## 2018-08-14 DIAGNOSIS — E782 Mixed hyperlipidemia: Secondary | ICD-10-CM

## 2018-08-14 DIAGNOSIS — I10 Essential (primary) hypertension: Secondary | ICD-10-CM

## 2018-08-14 NOTE — Progress Notes (Signed)
FOLLOW UP  Assessment and Plan:   Hypertension Well controlled with current medications  Monitor blood pressure at home; patient to call if consistently greater than 130/80 Continue DASH diet.   Reminder to go to the ER if any CP, SOB, nausea, dizziness, severe HA, changes vision/speech, left arm numbness and tingling and jaw pain.  Cholesterol Borderline elevated for several years; most recent check near goal by lifestyle modification  Continue low cholesterol diet and exercise.  Check lipid panel.   Prediabetes Continue diet and exercise.  Perform daily foot/skin check, notify office of any concerning changes.  Check A1C  Obesity with co morbidities Long discussion about weight loss, diet, and exercise Recommended diet heavy in fruits and veggies and low in animal meats, cheeses, and dairy products, appropriate calorie intake Discussed ideal weight for height  Patient will work on checking fat percentage, start tracking calories/macros on app Will follow up in 3 months  Vitamin D Def Below goal at last visit; has increased supplement to reach goal of 70-100 Check Vit D level next visit   Testosterone deficiency Previously on supplementation, has been working on weight loss and exercise Last testosterone at normal range 3 months ago; continue zinc supplement and exercise; defer checking levels to CPE  Continue diet and meds as discussed. Further disposition pending results of labs. Discussed med's effects and SE's.   Over 30 minutes of exam, counseling, chart review, and critical decision making was performed.   Future Appointments  Date Time Provider [place]  05/15/2019  3:00 PM [name], NP [facility] None    ----------------------------------------------------------------------------------------------------------------------  HPI 53 y.o. male  presents for 6 month follow up on hypertension, cholesterol, prediabetes, obesity, hypogonadism and  vitamin D deficiency. He is being followed by the [facility] for [facility]; [facility] which he reports has helped. He is diagnosed with ? Meniere's disease and is followed as needed by [facility]- currently managed by lifestyle, and reports daily allergy medication seems to be beneficial.   BMI is Body mass index is 35.09 kg/m., he has been working on diet and exercise - walks 5 miles every other day, 50 situps daily.  Wt Readings from Last 3 Encounters:  08/14/18 266 lb (120.7 kg)  05/10/18 261 lb 6.4 oz (118.6 kg)  01/26/18 262 lb (118.8 kg)  Breakfast: skips, will drink cranberry juice Lunch: grilled chicken salad - feta cheese, lettuce, green dressing, chicken, has with sweet tea Dinner: Varies, had grilled chicken and kale   His blood pressure has been controlled at home, today their BP is BP: 110/78  He does workout. He denies chest pain, shortness of breath, dizziness.   He is not on cholesterol medication and denies myalgias. His cholesterol is at goal. The cholesterol last visit was:   Lab Results  Component Value Date   CHOL 181 05/10/2018   HDL 43 05/10/2018   LDLCALC 116 (H) 05/10/2018   TRIG 117 05/10/2018   CHOLHDL 4.2 05/10/2018    He has been working on diet and exercise for prediabetes, and denies foot ulcerations, increased appetite, nausea, paresthesia of the feet, polydipsia, polyuria, visual disturbances, vomiting and weight loss. Last A1C in the office was:  Lab Results  Component Value Date   HGBA1C 5.9 (H) 05/10/2018   Patient is on Vitamin D supplement but remains well below goal of 60 at most recent check, currently taking 5000 IU daily but forgetting to take regularly:   Lab Results  Component Value  Date   VD25OH 106 05/10/2018     He has a history of testosterone deficiency but is not currently on supplement, was recommended weight loss and zinc supplement:   Lab Results  Component Value Date   TESTOSTERONE 315 05/10/2018      Current Medications:  Current Outpatient Medications on File Prior to Visit  Medication Sig  . cetirizine (ZYRTEC) 10 MG tablet Take 1 tablet (10 mg total) by mouth daily.  . Cholecalciferol (VITAMIN D-3) 5000 UNITS TABS Take 10,000 Units by mouth daily.   Marland Kitchen lisinopril-hydrochlorothiazide (PRINZIDE,ZESTORETIC) 20-12.5 MG tablet Take 1 tablet by mouth daily.  . Magnesium 500 MG CAPS Take 500 mg by mouth daily.  . nortriptyline (PAMELOR) 25 MG capsule Take 25 mg by mouth at bedtime.  Marland Kitchen OVER THE COUNTER MEDICATION as needed. OTC Acid Reducer  . SUMAtriptan (IMITREX) 25 MG tablet Take 25 mg by mouth daily. May repeat in 2 hours if headache persists or recurs.   . tadalafil (CIALIS) 20 MG tablet Take 1/2-1 tab once daily for erectile dysfunction.  . Zinc 50 MG TABS Take by mouth daily.  Marland Kitchen zolmitriptan (ZOMIG) 5 MG tablet Take 5 mg by mouth as needed for migraine.   No current facility-administered medications on file prior to visit.      Allergies:  Allergies  Allergen Reactions  . Ziac [Bisoprolol-Hydrochlorothiazide]     Erectile dysfunction     Medical History:  Past Medical History:  Diagnosis Date  . 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 GERD (gastroesophageal reflux disease)   . 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 Hyperlipidemia   . Hypertension   . Hypogonadism male   . Lumbar spinal stenosis 07/31/2014  . Pre-diabetes   . PTSD (post-traumatic stress disorder)   . Sleep apnea    pt does not use CPAP  . Tinnitus   . Vertigo    Family history- Reviewed and unchanged Social history- Reviewed and unchanged   Review of Systems:  Review of Systems  Constitutional: Negative for malaise/fatigue and weight loss.  HENT: Negative for hearing loss and tinnitus.   Eyes: Negative for blurred vision and double vision.  Respiratory: Negative for cough, shortness of breath and wheezing.    Cardiovascular: Negative for chest pain, palpitations, orthopnea, claudication and leg swelling.  Gastrointestinal: Negative for abdominal pain, blood in stool, constipation, diarrhea, heartburn, melena, nausea and vomiting.  Genitourinary: Negative.   Musculoskeletal: Negative for joint pain and myalgias.  Skin: Negative for rash.  Neurological: Negative for dizziness, tingling, sensory change, weakness and headaches.  Endo/Heme/Allergies: Negative for polydipsia.  Psychiatric/Behavioral: Negative.   All other systems reviewed and are negative.   Physical Exam: BP 110/78   Pulse 89   Temp 98.1 F (36.7 C)   Ht 6\' 1"  (1.854 m)   Wt 266 lb (120.7 kg)   SpO2 96%   BMI 35.09 kg/m  Wt Readings from Last 3 Encounters:  08/14/18 266 lb (120.7 kg)  05/10/18 261 lb 6.4 oz (118.6 kg)  01/26/18 262 lb (118.8 kg)   General Appearance: Well nourished, in no apparent distress. Eyes: PERRLA, EOMs, conjunctiva no swelling or erythema Sinuses: No Frontal/maxillary tenderness ENT/Mouth: Ext aud canals clear, TMs without erythema, bulging. No erythema, swelling, or exudate on post pharynx.  Tonsils not swollen or erythematous. Hearing normal.  Neck: Supple, thyroid normal.  Respiratory: Respiratory effort normal, BS equal bilaterally without rales, rhonchi, wheezing or stridor.  Cardio: RRR with no MRGs. Brisk peripheral pulses without edema.  Abdomen: Soft, + BS.  Non tender, no guarding, rebound, hernias, masses. Lymphatics: Non tender without lymphadenopathy.  Musculoskeletal: Full ROM, 5/5 strength, Normal gait Skin: Warm, dry without rashes, lesions, ecchymosis.  Neuro: Cranial nerves intact. No cerebellar symptoms.  Psych: Awake and oriented X 3, normal affect, Insight and Judgment appropriate.    Izora Ribas, NP 3:00 PM [facility]

## 2018-08-14 NOTE — Patient Instructions (Signed)
Goals    . HEMOGLOBIN A1C < 5.7    . Weight (lb) < 245 lb (111.1 kg)        Recommend working on weight loss (fat % reduction) for diabetes goal; can get fat monitor ([employer] makes a good hand-held device that you can get on [employer].com; goal is to get A1C <5.7%   Prediabetes Eating Plan Prediabetes is a condition that causes blood sugar (glucose) levels to be higher than normal. This increases the risk for developing diabetes. In order to prevent diabetes from developing, your health care provider may recommend a diet and other lifestyle changes to help you:  Control your blood glucose levels.  Improve your cholesterol levels.  Manage your blood pressure. Your health care provider may recommend working with a diet and nutrition specialist (dietitian) to make a meal plan that is best for you. What are tips for following this plan? Lifestyle  Set weight loss goals with the help of your health care team. It is recommended that most people with prediabetes lose 7% of their current body weight.  Exercise for at least 30 minutes at least 5 days a week.  Attend a support group or seek ongoing support from a mental health counselor.  Take over-the-counter and prescription medicines only as told by your health care provider. Reading food labels  Read food labels to check the amount of fat, salt (sodium), and sugar in prepackaged foods. Avoid foods that have: ? Saturated fats. ? Trans fats. ? Added sugars.  Avoid foods that have more than 300 milligrams (mg) of sodium per serving. Limit your daily sodium intake to less than 2,300 mg each day. Shopping  Avoid buying pre-made and processed foods. Cooking  Cook with olive oil. Do not use butter, lard, or ghee.  Bake, broil, grill, or boil foods. Avoid frying. Meal planning   Work with your dietitian to develop an eating plan that is right for you. This may include: ? Tracking how many calories you take in. Use a food diary,  notebook, or mobile application to track what you eat at each meal. ? Using the glycemic index (GI) to plan your meals. The index tells you how quickly a food will raise your blood glucose. Choose low-GI foods. These foods take a longer time to raise blood glucose.  Consider following a Mediterranean diet. This diet includes: ? Several servings each day of fresh fruits and vegetables. ? Eating fish at least twice a week. ? Several servings each day of whole grains, beans, nuts, and seeds. ? Using olive oil instead of other fats. ? Moderate alcohol consumption. ? Eating small amounts of red meat and whole-fat dairy.  If you have high blood pressure, you may need to limit your sodium intake or follow a diet such as the DASH eating plan. DASH is an eating plan that aims to lower high blood pressure. What foods are recommended? The items listed below may not be a complete list. Talk with your dietitian about what dietary choices are best for you. Grains Whole grains, such as whole-wheat or whole-grain breads, crackers, cereals, and pasta. Unsweetened oatmeal. Bulgur. Barley. Quinoa. Brown rice. Corn or whole-wheat flour tortillas or taco shells. Vegetables Lettuce. Spinach. Peas. Beets. Cauliflower. Cabbage. Broccoli. Carrots. Tomatoes. Squash. Eggplant. Herbs. Peppers. Onions. Cucumbers. Brussels sprouts. Fruits Berries. Bananas. Apples. Oranges. Grapes. Papaya. Mango. Pomegranate. Kiwi. Grapefruit. Cherries. Meats and other protein foods Seafood. Poultry without skin. Lean cuts of pork and beef. Tofu. Eggs. Nuts. Beans. Dairy Low-fat  or fat-free dairy products, such as yogurt, cottage cheese, and cheese. Beverages Water. Tea. Coffee. Sugar-free or diet soda. Seltzer water. Lowfat or no-fat milk. Milk alternatives, such as soy or almond milk. Fats and oils Olive oil. Canola oil. Sunflower oil. Grapeseed oil. Avocado. Walnuts. Sweets and desserts Sugar-free or low-fat pudding. Sugar-free or  low-fat ice cream and other frozen treats. Seasoning and other foods Herbs. Sodium-free spices. Mustard. Relish. Low-fat, low-sugar ketchup. Low-fat, low-sugar barbecue sauce. Low-fat or fat-free mayonnaise. What foods are not recommended? The items listed below may not be a complete list. Talk with your dietitian about what dietary choices are best for you. Grains Refined white flour and flour products, such as bread, pasta, snack foods, and cereals. Vegetables Canned vegetables. Frozen vegetables with butter or cream sauce. Fruits Fruits canned with syrup. Meats and other protein foods Fatty cuts of meat. Poultry with skin. Breaded or fried meat. Processed meats. Dairy Full-fat yogurt, cheese, or milk. Beverages Sweetened drinks, such as sweet iced tea and soda. Fats and oils Butter. Lard. Ghee. Sweets and desserts Baked goods, such as cake, cupcakes, pastries, cookies, and cheesecake. Seasoning and other foods Spice mixes with added salt. Ketchup. Barbecue sauce. Mayonnaise. Summary  To prevent diabetes from developing, you may need to make diet and other lifestyle changes to help control blood sugar, improve cholesterol levels, and manage your blood pressure.  Set weight loss goals with the help of your health care team. It is recommended that most people with prediabetes lose 7 percent of their current body weight.  Consider following a Mediterranean diet that includes plenty of fresh fruits and vegetables, whole grains, beans, nuts, seeds, fish, lean meat, low-fat dairy, and healthy oils. This information is not intended to replace advice given to you by your health care provider. Make sure you discuss any questions you have with your health care provider. Document Released: 09/24/2014 Document Revised: 07/14/2016 Document Reviewed: 07/14/2016 Elsevier Interactive Patient Education  2019 [employer].

## 2018-08-15 ENCOUNTER — Ambulatory Visit: Payer: Self-pay | Admitting: Adult Health

## 2018-08-15 LAB — LIPID PANEL
Cholesterol: 185 mg/dL (ref <200)
HDL: 43 mg/dL (ref >=40)
LDL CHOLESTEROL (CALC): 124 mg/dL — AB
Non-HDL Cholesterol (Calc): 142 mg/dL (calc) — ABNORMAL HIGH (ref <130)
Total CHOL/HDL Ratio: 4.3 (calc) (ref <5.0)
Triglycerides: 85 mg/dL (ref <150)

## 2018-08-15 LAB — COMPLETE METABOLIC PANEL WITH GFR
AG Ratio: 1.6 (calc) (ref 1.0–2.5)
ALT: 34 U/L (ref 9–46)
AST: 31 U/L (ref 10–35)
Albumin: 4.7 g/dL (ref 3.6–5.1)
Alkaline phosphatase (APISO): 56 U/L (ref 35–144)
BUN: 17 mg/dL (ref 7–25)
CO2: 29 mmol/L (ref 20–32)
Calcium: 9.9 mg/dL (ref 8.6–10.3)
Chloride: 102 mmol/L (ref 98–110)
Creat: 1.27 mg/dL (ref 0.70–1.33)
GFR, EST AFRICAN AMERICAN: 76 mL/min/{1.73_m2} (ref >=60)
GFR, EST NON AFRICAN AMERICAN: 65 mL/min/{1.73_m2} (ref >=60)
Globulin: 2.9 g/dL (calc) (ref 1.9–3.7)
Glucose, Bld: 86 mg/dL (ref 65–99)
Potassium: 4 mmol/L (ref 3.5–5.3)
Sodium: 140 mmol/L (ref 135–146)
Total Bilirubin: 0.6 mg/dL (ref 0.2–1.2)
Total Protein: 7.6 g/dL (ref 6.1–8.1)

## 2018-08-15 LAB — HEMOGLOBIN A1C
Hgb A1c MFr Bld: 6.3 % of total Hgb — ABNORMAL HIGH (ref <5.7)
Mean Plasma Glucose: 134 (calc)
eAG (mmol/L): 7.4 (calc)

## 2018-08-15 LAB — MAGNESIUM: Magnesium: 2 mg/dL (ref 1.5–2.5)

## 2018-08-15 LAB — CBC WITH DIFFERENTIAL/PLATELET
Absolute Monocytes: 570 cells/uL (ref 200–950)
BASOS ABS: 38 {cells}/uL (ref 0–200)
Basophils Relative: 0.5 %
Eosinophils Absolute: 152 cells/uL (ref 15–500)
Eosinophils Relative: 2 %
HCT: 45.9 % (ref 38.5–50.0)
Hemoglobin: 15.5 g/dL (ref 13.2–17.1)
Lymphs Abs: 2166 cells/uL (ref 850–3900)
MCH: 27.7 pg (ref 27.0–33.0)
MCHC: 33.8 g/dL (ref 32.0–36.0)
MCV: 82 fL (ref 80.0–100.0)
MPV: 11.4 fL (ref 7.5–12.5)
Monocytes Relative: 7.5 %
Neutro Abs: 4674 cells/uL (ref 1500–7800)
Neutrophils Relative %: 61.5 %
Platelets: 269 10*3/uL (ref 140–400)
RBC: 5.6 10*6/uL (ref 4.20–5.80)
RDW: 15 % (ref 11.0–15.0)
Total Lymphocyte: 28.5 %
WBC: 7.6 10*3/uL (ref 3.8–10.8)

## 2018-08-15 LAB — TSH: TSH: 1.21 m[IU]/L (ref 0.40–4.50)

## 2018-08-18 ENCOUNTER — Ambulatory Visit: Payer: Self-pay | Admitting: Adult Health

## 2018-08-22 ENCOUNTER — Encounter: Payer: Self-pay | Admitting: Internal Medicine

## 2018-08-24 DIAGNOSIS — M9903 Segmental and somatic dysfunction of lumbar region: Secondary | ICD-10-CM | POA: Diagnosis not present

## 2018-08-24 DIAGNOSIS — M47816 Spondylosis without myelopathy or radiculopathy, lumbar region: Secondary | ICD-10-CM | POA: Diagnosis not present

## 2018-08-24 DIAGNOSIS — M5136 Other intervertebral disc degeneration, lumbar region: Secondary | ICD-10-CM | POA: Diagnosis not present

## 2018-09-04 ENCOUNTER — Other Ambulatory Visit: Payer: Self-pay

## 2018-09-14 ENCOUNTER — Other Ambulatory Visit: Payer: Self-pay

## 2018-09-14 ENCOUNTER — Emergency Department
Admission: EM | Admit: 2018-09-14 | Discharge: 2018-09-15 | Disposition: A | Discharge status: Home / Self Care | Payer: 59 | Attending: Emergency Medicine | Admitting: Emergency Medicine

## 2018-09-14 DIAGNOSIS — I10 Essential (primary) hypertension: Secondary | ICD-10-CM | POA: Insufficient documentation

## 2018-09-14 DIAGNOSIS — T7840XA Allergy, unspecified, initial encounter: Secondary | ICD-10-CM | POA: Diagnosis not present

## 2018-09-14 DIAGNOSIS — Z79899 Other long term (current) drug therapy: Secondary | ICD-10-CM | POA: Insufficient documentation

## 2018-09-14 DIAGNOSIS — R600 Localized edema: Secondary | ICD-10-CM | POA: Diagnosis present

## 2018-09-14 MED ORDER — METHYLPREDNISOLONE SODIUM SUCC 125 MG IJ SOLR
125.0000 mg | Freq: Once | INTRAMUSCULAR | Status: AC
  Administered 2018-09-14: 125 mg via INTRAVENOUS
  Filled 2018-09-14: qty 2

## 2018-09-14 MED ORDER — FAMOTIDINE IN NACL 20-0.9 MG/50ML-% IV SOLN
20.0000 mg | Freq: Once | INTRAVENOUS | Status: AC
  Administered 2018-09-14: 22:00:00 20 mg via INTRAVENOUS
  Filled 2018-09-14: qty 50

## 2018-09-14 MED ORDER — DIPHENHYDRAMINE HCL 50 MG/ML IJ SOLN
25.0000 mg | Freq: Once | INTRAMUSCULAR | Status: AC
  Administered 2018-09-14: 25 mg via INTRAVENOUS
  Filled 2018-09-14: qty 1

## 2018-09-14 NOTE — ED Triage Notes (Signed)
Patient c/o facial swelling post administration of dye to beard.

## 2018-09-14 NOTE — ED Provider Notes (Signed)
[facility] Emergency Department Provider Note   ____________________________________________    I have reviewed the triage vital signs and the nursing notes.   HISTORY  Chief Complaint Allergic Reaction     HPI Craig [name] is a 53 y.o. male who presents with complaints of swelling to the face.  Patient reports that approximately 4:00 he applied hair dye to his beard.  At 5:00 he took a shower.  He then went and had some shrimp from [employer].  He does take lisinopril for blood pressure has taken it for many many years.  Around 8 PM he noticed that he had swelling to his cheeks and jawline and chin where he had applied the hair dye.  He is never had a reaction like this before.  Denies any difficulty breathing.  No throat swelling.  No intraoral swelling  Past Medical History:  Diagnosis Date  . 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 GERD (gastroesophageal reflux disease)   . 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 Hyperlipidemia   . Hypertension   . Hypogonadism male   . Lumbar spinal stenosis 07/31/2014  . Pre-diabetes   . PTSD (post-traumatic stress disorder)   . Sleep apnea    pt does not use CPAP  . Tinnitus   . Vertigo     Patient Active Problem List   Diagnosis Date Noted  . BPH (benign prostatic hyperplasia) 05/10/2018  . History of kidney cancer 05/10/2018  . Testosterone deficiency 08/16/2017  . Meniere disease 09/02/2014  . Obesity (BMI 30.0-34.9) 07/15/2014  . TMJ (temporomandibular joint syndrome) 04/01/2014  . Medication management 08/31/2013  . GERD 08/31/2013  . OSA (obstructive sleep apnea) 07/13/2013  . Essential hypertension 05/31/2013  . Hyperlipidemia 05/31/2013  . Prediabetes 05/31/2013  . Vitamin D deficiency 05/31/2013    Past Surgical History:  Procedure Laterality Date  . BUNIONECTOMY Bilateral   . CATARACT  EXTRACTION Right    2016  . CATARACT EXTRACTION W/ INTRAOCULAR LENS IMPLANT Left 2014  . LUMBAR LAMINECTOMY/DECOMPRESSION MICRODISCECTOMY Left 07/31/2014   Procedure: LUMBAR LAMINECTOMY/DECOMPRESSION MICRODISCECTOMY 1 LEVEL L5-S1 ON LEFT;  Surgeon: Susa Day, MD;  Location: [facility] ORS;  Service: Orthopedics;  Laterality: Left;  . PARTIAL NEPHRECTOMY Left 2008  . TOTAL HIP ARTHROPLASTY Right 2007    Prior to Admission medications   Medication Sig Start Date End Date Taking? Authorizing Provider  cetirizine (ZYRTEC) 10 MG tablet Take 1 tablet (10 mg total) by mouth daily. 07/15/14   Unk Pinto, MD  Cholecalciferol (VITAMIN D-3) 5000 UNITS TABS Take 10,000 Units by mouth daily.     [provider]  lisinopril-hydrochlorothiazide (PRINZIDE,ZESTORETIC) 20-12.5 MG tablet Take 1 tablet by mouth daily.    [provider]  Magnesium 500 MG CAPS Take 500 mg by mouth daily.    [provider]  nortriptyline (PAMELOR) 25 MG capsule Take 25 mg by mouth at bedtime.    [provider]  OVER THE COUNTER MEDICATION as needed. OTC Acid Reducer    [provider]  SUMAtriptan (IMITREX) 25 MG tablet Take 25 mg by mouth daily. May repeat in 2 hours if headache persists or recurs.     [provider]  tadalafil (CIALIS) 20 MG tablet Take 1/2-1 tab once daily for erectile dysfunction. 05/10/18   [name], NP  Zinc 50 MG TABS  Take by mouth daily.    [provider]  zolmitriptan (ZOMIG) 5 MG tablet Take 5 mg by mouth as needed for migraine.    [provider]     Allergies Ziac [bisoprolol-hydrochlorothiazide]  Family History  Problem Relation Age of Onset  . Breast cancer Mother   . Hypertension Maternal Grandmother   . Breast cancer Paternal Grandmother     Social History Social History   Tobacco Use  . Smoking status: Never Smoker  . Smokeless tobacco: Never Used  Substance Use Topics  . Alcohol use: No  . Drug use:  No    Review of Systems  Constitutional: No fever/chills Eyes: No visual changes.  ENT: As above Cardiovascular: Denies chest pain. Respiratory: Denies shortness of breath. Gastrointestinal:  No nausea, no vomiting.   Genitourinary: No incontinence Musculoskeletal: No extremity swelling Skin: Negative for rash. Neurological: Negative for headaches    ____________________________________________   PHYSICAL EXAM:  VITAL SIGNS: ED Triage Vitals  Enc Vitals Group     BP 09/14/18 2202 (!) 132/98     Pulse Rate 09/14/18 2202 96     Resp 09/14/18 2202 18     Temp 09/14/18 2202 98.2 F (36.8 C)     Temp Source 09/14/18 2202 Oral     SpO2 09/14/18 2202 100 %     Weight 09/14/18 2203 120 kg (264 lb 8.8 oz)     Height --      Head Circumference --      Peak Flow --      Pain Score 09/14/18 2203 4     Pain Loc --      Pain Edu? --      Excl. in [place]? --     Constitutional: Alert and oriented. Eyes: Conjunctivae are normal.  Head: Swelling to the chin and mild swelling to the angle of the jaw bilaterally Nose: No congestion/rhinnorhea. Mouth/Throat: Mucous membranes are moist.  No intraoral swelling, pharynx is normal  Cardiovascular: Normal rate, regular rhythm. Grossly normal heart sounds.  Good peripheral circulation. Respiratory: Normal respiratory effort.  No retractions. Lungs CTAB. Gastrointestinal: Soft and nontender. No distention.  Musculoskeletal: Warm and well perfused Neurologic:  Normal speech and language. No gross focal neurologic deficits are appreciated.  Skin:  Skin is warm, dry and intact. No rash noted. Psychiatric: Mood and affect are normal. Speech and behavior are normal.  ____________________________________________   LABS (all labs ordered are listed, but only abnormal results are displayed)  Labs Reviewed - No data to display ____________________________________________  EKG  None ____________________________________________  RADIOLOGY   None ____________________________________________   PROCEDURES  Procedure(s) performed: No  Procedures   Critical Care performed: No ____________________________________________   INITIAL IMPRESSION / ASSESSMENT AND PLAN / ED COURSE  Pertinent labs & imaging results that were available during my care of the patient were reviewed by me and considered in my medical decision making (see chart for details).  Patient presents with what appears to be an allergic reaction to hair dye.  This does not appear to be angioedema related to ACE inhibitor as it has presented only in the area where he applied the hair dye.  No intraoral swelling, no urticaria.  No difficulty breathing.  We will give IV Solu-Medrol, IV Pepcid, IV Benadryl and observe the patient in the emergency department    ____________________________________________   FINAL CLINICAL IMPRESSION(S) / ED DIAGNOSES  Final diagnoses:  Allergic reaction, initial encounter        Note:  This document was prepared using Dragon voice recognition software and may include unintentional dictation errors.   Lavonia Drafts, MD 09/14/18 2248

## 2018-09-14 NOTE — ED Notes (Signed)
ED Provider at bedside. 

## 2018-09-15 MED ORDER — PREDNISONE 20 MG PO TABS: 60.0000 mg | ORAL_TABLET | Freq: Every day | ORAL | 0 refills | Status: AC

## 2018-10-11 DIAGNOSIS — M9903 Segmental and somatic dysfunction of lumbar region: Secondary | ICD-10-CM | POA: Diagnosis not present

## 2018-10-11 DIAGNOSIS — M5136 Other intervertebral disc degeneration, lumbar region: Secondary | ICD-10-CM | POA: Diagnosis not present

## 2018-10-11 DIAGNOSIS — M47816 Spondylosis without myelopathy or radiculopathy, lumbar region: Secondary | ICD-10-CM | POA: Diagnosis not present

## 2018-10-13 DIAGNOSIS — M5136 Other intervertebral disc degeneration, lumbar region: Secondary | ICD-10-CM | POA: Diagnosis not present

## 2018-10-13 DIAGNOSIS — M47816 Spondylosis without myelopathy or radiculopathy, lumbar region: Secondary | ICD-10-CM | POA: Diagnosis not present

## 2018-10-13 DIAGNOSIS — M9903 Segmental and somatic dysfunction of lumbar region: Secondary | ICD-10-CM | POA: Diagnosis not present

## 2019-01-02 ENCOUNTER — Ambulatory Visit: Payer: 59 | Admitting: Adult Health Nurse Practitioner

## 2019-01-02 ENCOUNTER — Encounter: Payer: Self-pay | Admitting: Adult Health Nurse Practitioner

## 2019-01-02 ENCOUNTER — Other Ambulatory Visit: Payer: Self-pay

## 2019-01-02 VITALS — BP 124/84 | HR 87 | Temp 97.5°F | Ht 73.0 in | Wt 271.0 lb

## 2019-01-02 DIAGNOSIS — K5900 Constipation, unspecified: Secondary | ICD-10-CM

## 2019-01-02 DIAGNOSIS — K644 Residual hemorrhoidal skin tags: Secondary | ICD-10-CM | POA: Diagnosis not present

## 2019-01-02 DIAGNOSIS — I10 Essential (primary) hypertension: Secondary | ICD-10-CM | POA: Diagnosis not present

## 2019-01-02 NOTE — Progress Notes (Signed)
Assessment and Plan:  Craig [name] was seen today for hemorrhoids.  Diagnoses and all orders for this visit:  Hemorrhoids, external without complications Continue preporation H / Tuck as needed Monitor for changes Discussed follow up S&S if worsening Increase fiber in diet to reduce straining Continue healthy exercise habits  Constipation Increase fiber in diet, list of food high in fiber provided. Add Miralax daily, if too  Much every other day Consider Metamucil or colace as other agents. Important to reduce straining  Essential hypertension Controlled today Continue medications Monitor blood pressure at home; call if consistently over 130/80 Continue DASH diet.   Reminder to go to the ER if any CP, SOB, nausea, dizziness, severe HA, changes vision/speech, left arm numbness and tingling and jaw pain.       Further disposition pending results of labs. Discussed med's effects and SE's.   Over 30 minutes of exam, counseling, chart review, and critical decision making was performed.   Future Appointments  Date Time Provider [place]  01/02/2019  2:00 PM Garnet Sierras, NP [facility] None  01/15/2019  9:30 AM [name], NP [facility] None  05/15/2019  3:00 PM [name], NP [facility] None    ------------------------------------------------------------------------------------------------------------------   HPI 53 y.o.male presents for evaluation of questional hemrroids?  He feels a knot when wiping after bowel movement.  Denies any blood on tissue or in the toilet.  He has never had anything like this in the past.  He tried some preperation H and witch Hazels pads that he thought helped some.  Denies any rectal pain or bleeding, abdominal pain, nausea vomiting or diarrhea.    He is active and walk 6 miles a day.  He admits that he does not drink enough water during the day.  Reports he has a bowel movement every day but has had to strain.  Does report some  days he has 2-3 bowel movements, formed.  He had [facility].  2 polyps noted and repeat in 10years.  No familial history for colon cancer.    Past Medical History:  Diagnosis Date  . 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 GERD (gastroesophageal reflux disease)   . 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 Hyperlipidemia   . Hypertension   . Hypogonadism male   . Lumbar spinal stenosis 07/31/2014  . Pre-diabetes   . PTSD (post-traumatic stress disorder)   . Sleep apnea    pt does not use CPAP  . Tinnitus   . Vertigo      Allergies  Allergen Reactions  . Ziac [Bisoprolol-Hydrochlorothiazide]     Erectile dysfunction    Current Outpatient Medications on File Prior to Visit  Medication Sig  . cetirizine (ZYRTEC) 10 MG tablet Take 1 tablet (10 mg total) by mouth daily.  . Cholecalciferol (VITAMIN D-3) 5000 UNITS TABS Take 10,000 Units by mouth daily.   Marland Kitchen lisinopril-hydrochlorothiazide (PRINZIDE,ZESTORETIC) 20-12.5 MG tablet Take 1 tablet by mouth daily.  . Magnesium 500 MG CAPS Take 500 mg by mouth daily.  . nortriptyline (PAMELOR) 25 MG capsule Take 25 mg by mouth at bedtime.  Marland Kitchen OVER THE COUNTER MEDICATION as needed. OTC Acid Reducer  . SUMAtriptan (IMITREX) 25 MG tablet Take 25 mg by mouth daily. May repeat in 2 hours if headache persists or recurs.   . tadalafil (CIALIS) 20 MG tablet  Take 1/2-1 tab once daily for erectile dysfunction.  . Zinc 50 MG TABS Take by mouth daily.  Marland Kitchen zolmitriptan (ZOMIG) 5 MG tablet Take 5 mg by mouth as needed for migraine.   No current facility-administered medications on file prior to visit.     ROS: all negative except above.   Physical Exam:  There were no vitals taken for this visit.  General Appearance: Well nourished, in no apparent distress. Eyes: PERRLA, EOMs, conjunctiva no swelling or erythema Sinuses: No Frontal/maxillary  tenderness ENT/Mouth: Ext aud canals clear, TMs without erythema, bulging. No erythema, swelling, or exudate on post pharynx.  Tonsils not swollen or erythematous. Hearing normal.  Neck: Supple, thyroid normal.  Respiratory: Respiratory effort normal, BS equal bilaterally without rales, rhonchi, wheezing or stridor.  Cardio: RRR with no MRGs. Brisk peripheral pulses without edema.  Abdomen: Soft, + BS.  Non tender, no guarding, rebound, hernias, masses. Rectal: Single external hemorrhoid, 1.5cmx1cm at 3o'clock. Skin in tact. Eternal exam:deferred Musculoskeletal: Full ROM, 5/5 strength, normal gait.  Skin: Warm, dry without rashes, lesions, ecchymosis.  Neuro: Cranial nerves intact. Normal muscle tone, no cerebellar symptoms. Sensation intact.  Psych: Awake and oriented X 3, normal affect, Insight and Judgment appropriate.     Garnet Sierras, NP 1:25 AM [facility]

## 2019-01-02 NOTE — Patient Instructions (Addendum)
Constipation: Increase water intake Increasing fiber will also help.  You can get this from vegetables, high fiber cereals, prune juice.  Increasing activity can also help with this, walking If diet/activity adjustments alone are not working try one of the following below.  You may purchase these at any local pharmacy.  Colace (ducosate sodium) 100mg  tablet once a day.  This is a stool softener, not a stimulant.  So this will not cause you to rush to the bathroom.  If you find everyday is too much, try every other or every third day.  Miralax is another product, powder that can be mixed with water, juice or coffee.  Try once a day, if too much every other.  This medicine works to add bulk, water, to your stool to reduce straining, not a stimulant.  You can also use Metamucil to increase fiber.  The goal is to prevent straining.  Please call or return if you have any severe abdominal pains, rectal bleeding or blood in your stool.     Constipation, Adult Constipation is when a person:  Poops (has a bowel movement) fewer times in a week than normal.  Has a hard time pooping.  Has poop that is dry, hard, or bigger than normal. Follow these instructions at home: Eating and drinking   Eat foods that have a lot of fiber, such as: ? Fresh fruits and vegetables. ? Whole grains. ? Beans.  Eat less of foods that are high in fat, low in fiber, or overly processed, such as: ? [place] fries. ? Hamburgers. ? Cookies. ? Candy. ? Soda.  Drink enough fluid to keep your pee (urine) clear or pale yellow. General instructions  Exercise regularly or as told by your doctor.  Go to the restroom when you feel like you need to poop. Do not hold it in.  Take over-the-counter and prescription medicines only as told by your doctor. These include any fiber supplements.  Do pelvic floor retraining exercises, such as: ? Doing deep breathing while relaxing your lower belly (abdomen). ?  Relaxing your pelvic floor while pooping.  Watch your condition for any changes.  Keep all follow-up visits as told by your doctor. This is important. Contact a doctor if:  You have pain that gets worse.  You have a fever.  You have not pooped for 4 days.  You throw up (vomit).  You are not hungry.  You lose weight.  You are bleeding from the anus.  You have thin, pencil-like poop (stool). Get help right away if:  You have a fever, and your symptoms suddenly get worse.  You leak poop or have blood in your poop.  Your belly feels hard or bigger than normal (is bloated).  You have very bad belly pain.  You feel dizzy or you faint. This information is not intended to replace advice given to you by your health care provider. Make sure you discuss any questions you have with your health care provider. Document Released: 10/27/2007 Document Revised: 04/22/2017 Document Reviewed: 10/29/2015 Elsevier Patient Education  2020 [employer].  Hemorrhoids Hemorrhoids are swollen veins in and around the rectum or anus. There are two types of hemorrhoids:  Internal hemorrhoids. These occur in the veins that are just inside the rectum. They may poke through to the outside and become irritated and painful.  External hemorrhoids. These occur in the veins that are outside the anus and can be felt as a painful swelling or hard lump near the anus.  Most hemorrhoids do not cause serious problems, and they can be managed with home treatments such as diet and lifestyle changes. If home treatments do not help the symptoms, procedures can be done to shrink or remove the hemorrhoids. What are the causes? This condition is caused by increased pressure in the anal area. This pressure may result from various things, including:  Constipation.  Straining to have a bowel movement.  Diarrhea.  Pregnancy.  Obesity.  Sitting for long periods of time.  Heavy lifting or other activity that  causes you to strain.  Anal sex.  Riding a bike for a long period of time. What are the signs or symptoms? Symptoms of this condition include:  Pain.  Anal itching or irritation.  Rectal bleeding.  Leakage of stool (feces).  Anal swelling.  One or more lumps around the anus. How is this diagnosed? This condition can often be diagnosed through a visual exam. Other exams or tests may also be done, such as:  An exam that involves feeling the rectal area with a gloved hand (digital rectal exam).  An exam of the anal canal that is done using a small tube (anoscope).  A blood test, if you have lost a significant amount of blood.  A test to look inside the colon using a flexible tube with a camera on the end (sigmoidoscopy or colonoscopy). How is this treated? This condition can usually be treated at home. However, various procedures may be done if dietary changes, lifestyle changes, and other home treatments do not help your symptoms. These procedures can help make the hemorrhoids smaller or remove them completely. Some of these procedures involve surgery, and others do not. Common procedures include:  Rubber band ligation. Rubber bands are placed at the base of the hemorrhoids to cut off their blood supply.  Sclerotherapy. Medicine is injected into the hemorrhoids to shrink them.  Infrared coagulation. A type of light energy is used to get rid of the hemorrhoids.  Hemorrhoidectomy surgery. The hemorrhoids are surgically removed, and the veins that supply them are tied off.  Stapled hemorrhoidopexy surgery. The surgeon staples the base of the hemorrhoid to the rectal wall. Follow these instructions at home: Eating and drinking   Eat foods that have a lot of fiber in them, such as whole grains, beans, nuts, fruits, and vegetables.  Ask your health care provider about taking products that have added fiber (fiber supplements).  Reduce the amount of fat in your diet. You can do  this by eating low-fat dairy products, eating less red meat, and avoiding processed foods.  Drink enough fluid to keep your urine pale yellow. Managing pain and swelling   Take warm sitz baths for 20 minutes, 3-4 times a day to ease pain and discomfort. You may do this in a bathtub or using a portable sitz bath that fits over the toilet.  If directed, apply ice to the affected area. Using ice packs between sitz baths may be helpful. ? Put ice in a plastic bag. ? Place a towel between your skin and the bag. ? Leave the ice on for 20 minutes, 2-3 times a day. General instructions  Take over-the-counter and prescription medicines only as told by your health care provider.  Use medicated creams or suppositories as told.  Get regular exercise. Ask your health care provider how much and what kind of exercise is best for you. In general, you should do moderate exercise for at least 30 minutes on most days of  the week (150 minutes each week). This can include activities such as walking, biking, or yoga.  Go to the bathroom when you have the urge to have a bowel movement. Do not wait.  Avoid straining to have bowel movements.  Keep the anal area dry and clean. Use wet toilet paper or moist towelettes after a bowel movement.  Do not sit on the toilet for long periods of time. This increases blood pooling and pain.  Keep all follow-up visits as told by your health care provider. This is important. Contact a health care provider if you have:  Increasing pain and swelling that are not controlled by treatment or medicine.  Difficulty having a bowel movement, or you are unable to have a bowel movement.  Pain or inflammation outside the area of the hemorrhoids. Get help right away if you have:  Uncontrolled bleeding from your rectum. Summary  Hemorrhoids are swollen veins in and around the rectum or anus.  Most hemorrhoids can be managed with home treatments such as diet and lifestyle  changes.  Taking warm sitz baths can help ease pain and discomfort.  In severe cases, procedures or surgery can be done to shrink or remove the hemorrhoids. This information is not intended to replace advice given to you by your health care provider. Make sure you discuss any questions you have with your health care provider. Document Released: 05/07/2000 Document Revised: 05/18/2018 Document Reviewed: 09/29/2017 Elsevier Patient Education  2020 [employer].

## 2019-01-10 NOTE — Progress Notes (Signed)
FOLLOW UP  Assessment and Plan:   Hypertension Well controlled with current medications  Monitor blood pressure at home; patient to call if consistently greater than 130/80 Continue DASH diet.   Reminder to go to the ER if any CP, SOB, nausea, dizziness, severe HA, changes vision/speech, left arm numbness and tingling and jaw pain.  Cholesterol Borderline elevated for several years; monitor and initiate statin for LDL 130+  Continue low cholesterol diet and exercise.  Check lipid panel.    Prediabetes Continue diet and exercise.  Perform daily foot/skin check, notify office of any concerning changes.  Check A1C at CPE; monitor weight, serum glucose  Obesity with co morbidities Long discussion about weight loss, diet, and exercise Recommended diet heavy in fruits and veggies and low in animal meats, cheeses, and dairy products, appropriate calorie intake Discussed ideal weight for height  Patient will work on checking fat percentage, start tracking calories/macros on app Will follow up in 3 months  Vitamin D Def Below goal at last visit; has increased supplement to reach goal of 70-100 Check Vit D level next visit   Testosterone deficiency Previously on supplementation, has been working on weight loss and exercise Last testosterone at normal range; continue zinc supplement and exercise; defer checking levels to CPE  Continue diet and meds as discussed. Further disposition pending results of labs. Discussed med's effects and SE's.   Over 30 minutes of exam, counseling, chart review, and critical decision making was performed.   Future Appointments  Date Time Provider [place]  05/15/2019  3:00 PM [name], NP [facility] None    ----------------------------------------------------------------------------------------------------------------------  HPI 53 y.o. male  presents for 6 month follow up on hypertension, cholesterol, prediabetes, obesity,  hypogonadism and vitamin D deficiency. 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 Winter 07/21/2018, recommended 1 year follow up, PSA annually at CPE.    He is being followed by the [facility] for frequent headaches; [facility] which [facility]. He is diagnosed with ? Meniere's disease and is followed as needed by [facility]- currently managed by lifestyle, and reports daily allergy medication seems to be beneficial.   BMI is Body mass index is 35.62 kg/m., he has been working on diet and exercise - walks 6 miles every other day, 50 pushups daily. Avoiding saturated fats. He reports still getting takeout, but will make last over lunch and dinner. Will eat rice/fries last and not finish.  Wt Readings from Last 3 Encounters:  01/15/19 270 lb (122.5 kg)  01/02/19 271 lb (122.9 kg)  09/14/18 264 lb 8.8 oz (120 kg)   His blood pressure has been controlled at home, today their BP is BP: 118/78  He does workout. He denies chest pain, shortness of breath, dizziness.   He is not on cholesterol medication and denies myalgias. His cholesterol is not at goal. The cholesterol last visit was:   Lab Results  Component Value Date   CHOL 185 08/14/2018   HDL 43 08/14/2018   LDLCALC 124 (H) 08/14/2018   TRIG 85 08/14/2018   CHOLHDL 4.3 08/14/2018    He has been working on diet and exercise for prediabetes, and denies foot ulcerations, increased appetite, nausea, paresthesia of the feet, polydipsia, polyuria, visual disturbances, vomiting and weight loss. Last A1C in the office was:  Lab Results  Component Value Date   HGBA1C 6.3 (H) 08/14/2018   Lab Results  Component Value Date   GFRAA 76 08/14/2018   Patient is on Vitamin D supplement but remains well below goal of 60 at most recent check, currently taking 5000 IU daily but forgetting to take regularly:   Lab Results  Component Value Date   VD25OH 2 05/10/2018      He has a history of testosterone deficiency but is not currently on supplement, was recommended weight loss and zinc supplement:   Lab Results  Component Value Date   TESTOSTERONE 315 05/10/2018  He is on cialis daily via urology [place] and reports doing well with this.    Current Medications:  Current Outpatient Medications on File Prior to Visit  Medication Sig  . cetirizine (ZYRTEC) 10 MG tablet Take 1 tablet (10 mg total) by mouth daily.  . Cholecalciferol (VITAMIN D-3) 5000 UNITS TABS Take 10,000 Units by mouth daily.   Marland Kitchen lisinopril-hydrochlorothiazide (PRINZIDE,ZESTORETIC) 20-12.5 MG tablet Take 1 tablet by mouth daily.  . Magnesium 500 MG CAPS Take 500 mg by mouth daily.  . nortriptyline (PAMELOR) 25 MG capsule Take 25 mg by mouth at bedtime.  Marland Kitchen OVER THE COUNTER MEDICATION as needed. OTC Acid Reducer  . SUMAtriptan (IMITREX) 25 MG tablet Take 25 mg by mouth daily. May repeat in 2 hours if headache persists or recurs.   . tadalafil (CIALIS) 20 MG tablet Take 1/2-1 tab once daily for erectile dysfunction.  . Zinc 50 MG TABS Take by mouth daily.  Marland Kitchen zolmitriptan (ZOMIG) 5 MG tablet Take 5 mg by mouth as needed for migraine.   No current facility-administered medications on file prior to visit.      Allergies:  Allergies  Allergen Reactions  . Ziac [Bisoprolol-Hydrochlorothiazide]     Erectile dysfunction     Medical History:  Past Medical History:  Diagnosis Date  . 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 GERD (gastroesophageal reflux disease)   . 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 Hyperlipidemia   . Hypertension   . Hypogonadism male   . Lumbar spinal stenosis 07/31/2014  . Pre-diabetes   . PTSD (post-traumatic stress disorder)   . Sleep apnea    pt does not use CPAP  . Tinnitus   . Vertigo    Family history- Reviewed and unchanged Social history- Reviewed and  unchanged   Review of Systems:  Review of Systems  Constitutional: Negative for malaise/fatigue and weight loss.  HENT: Negative for hearing loss and tinnitus.   Eyes: Negative for blurred vision and double vision.  Respiratory: Negative for cough, shortness of breath and wheezing.   Cardiovascular: Negative for chest pain, palpitations, orthopnea, claudication and leg swelling.  Gastrointestinal: Negative for abdominal pain, blood in stool, constipation, diarrhea, heartburn, melena, nausea and vomiting.  Genitourinary: Negative.   Musculoskeletal: Negative for joint pain and myalgias.  Skin: Negative for rash.  Neurological: Negative for dizziness, tingling, sensory change, weakness and headaches.  Endo/Heme/Allergies: Negative for polydipsia.  Psychiatric/Behavioral: Negative.   All other systems reviewed and are negative.   Physical Exam: BP 118/78   Pulse 82   Temp (!) 97.3 F (36.3 C)   Ht 6\' 1"  (1.854 m)   Wt 270 lb (122.5 kg)   SpO2 96%   BMI 35.62 kg/m  Wt Readings from Last 3 Encounters:  01/15/19 270 lb (122.5 kg)  01/02/19 271 lb (122.9 kg)  09/14/18  264 lb 8.8 oz (120 kg)   General Appearance: Well nourished, in no apparent distress. Eyes: PERRLA, EOMs, conjunctiva no swelling or erythema Sinuses: No Frontal/maxillary tenderness ENT/Mouth: Ext aud canals clear, TMs without erythema, bulging. No erythema, swelling, or exudate on post pharynx.  Tonsils not swollen or erythematous. Hearing normal.  Neck: Supple, thyroid normal.  Respiratory: Respiratory effort normal, BS equal bilaterally without rales, rhonchi, wheezing or stridor.  Cardio: RRR with no MRGs. Brisk peripheral pulses without edema.  Abdomen: Soft, + BS.  Non tender, no guarding, rebound, hernias, masses. Lymphatics: Non tender without lymphadenopathy.  Musculoskeletal: Full ROM, 5/5 strength, Normal gait Skin: Warm, dry without rashes, lesions, ecchymosis.  Neuro: Cranial nerves intact. No  cerebellar symptoms.  Psych: Awake and oriented X 3, normal affect, Insight and Judgment appropriate.    Izora Ribas, NP 9:31 AM [facility]

## 2019-01-15 ENCOUNTER — Other Ambulatory Visit: Payer: Self-pay

## 2019-01-15 ENCOUNTER — Encounter: Payer: Self-pay | Admitting: Adult Health

## 2019-01-15 ENCOUNTER — Ambulatory Visit: Payer: 59 | Admitting: Adult Health

## 2019-01-15 VITALS — BP 118/78 | HR 82 | Temp 97.3°F | Ht 73.0 in | Wt 270.0 lb

## 2019-01-15 DIAGNOSIS — R7303 Prediabetes: Secondary | ICD-10-CM

## 2019-01-15 DIAGNOSIS — E349 Endocrine disorder, unspecified: Secondary | ICD-10-CM

## 2019-01-15 DIAGNOSIS — E782 Mixed hyperlipidemia: Secondary | ICD-10-CM

## 2019-01-15 DIAGNOSIS — E559 Vitamin D deficiency, unspecified: Secondary | ICD-10-CM

## 2019-01-15 DIAGNOSIS — E669 Obesity, unspecified: Secondary | ICD-10-CM

## 2019-01-15 DIAGNOSIS — I10 Essential (primary) hypertension: Secondary | ICD-10-CM

## 2019-01-15 DIAGNOSIS — Z79899 Other long term (current) drug therapy: Secondary | ICD-10-CM

## 2019-01-15 NOTE — Patient Instructions (Addendum)
Goals    . HEMOGLOBIN A1C < 5.7    . Weight (lb) < 245 lb (111.1 kg)       Recommend stopping cranberry juice (body can't tell the difference between juice and soda)  Eat rice/potatoes/starches last - only eat if still hungry after finishing veggies/protein  Pick up a hand held fat monitor ($25 on amazon - [employer] makes a good one) or get a scale that measure fat %      Drink 1/2 your body weight in fluid ounces of water daily; drink a tall glass of water 30 min before meals  Don't eat until you're stuffed- listen to your stomach and eat until you are 80% full   Try eating off of a salad plate; wait 10 min after finishing before going back for seconds  Start by eating the vegetables on your plate; aim for 50% of your meals to be fruits or vegetables  Then eat your protein - lean meats (grass fed if possible), fish, beans, nuts in moderation  Eat your carbs/starch last ONLY if you still are hungry. If you can, stop before finishing it all  Avoid sugar and flour - the closer it looks to it's original form in nature, typically the better it is for you  Splurge in moderation - "assign" days when you get to splurge and have the "bad stuff" - I like to follow a 80% - 20% plan- "good" choices 80 % of the time, "bad" choices in moderation 20% of the time  Simple equation is: Calories out > calories in = weight loss - even if you eat the bad stuff, if you limit portions, you will still lose weight      Preventing Type 2 Diabetes Mellitus Type 2 diabetes (type 2 diabetes mellitus) is a long-term (chronic) disease that affects blood sugar (glucose) levels. Normally, a hormone called insulin allows glucose to enter cells in the body. The cells use glucose for energy. In type 2 diabetes, one or both of these problems may be present:  The body does not make enough insulin.  The body does not respond properly to insulin that it makes (insulin resistance). Insulin resistance or lack of  insulin causes excess glucose to build up in the blood instead of going into cells. As a result, high blood glucose (hyperglycemia) develops, which can cause many complications. Being overweight or obese and having an inactive (sedentary) lifestyle can increase your risk for diabetes. Type 2 diabetes can be delayed or prevented by making certain nutrition and lifestyle changes. What nutrition changes can be made?   Eat healthy meals and snacks regularly. Keep a healthy snack with you for when you get hungry between meals, such as fruit or a handful of nuts.  Eat lean meats and proteins that are low in saturated fats, such as chicken, fish, egg whites, and beans. Avoid processed meats.  Eat plenty of fruits and vegetables and plenty of grains that have not been processed (whole grains). It is recommended that you eat: ? 1?2 cups of fruit every day. ? 2?3 cups of vegetables every day. ? 6?8 oz of whole grains every day, such as oats, whole wheat, bulgur, brown rice, quinoa, and millet.  Eat low-fat dairy products, such as milk, yogurt, and cheese.  Eat foods that contain healthy fats, such as nuts, avocado, olive oil, and canola oil.  Drink water throughout the day. Avoid drinks that contain added sugar, such as soda or sweet tea.  Follow instructions from  your health care provider about specific eating or drinking restrictions.  Control how much food you eat at a time (portion size). ? Check food labels to find out the serving sizes of foods. ? Use a kitchen scale to weigh amounts of foods.  Saute or steam food instead of frying it. Cook with water or broth instead of oils or butter.  Limit your intake of: ? Salt (sodium). Have no more than 1 tsp (2,400 mg) of sodium a day. If you have heart disease or high blood pressure, have less than ? tsp (1,500 mg) of sodium a day. ? Saturated fat. This is fat that is solid at room temperature, such as butter or fat on meat. What lifestyle  changes can be made? Activity   Do moderate-intensity physical activity for at least 30 minutes on at least 5 days of the week, or as much as told by your health care provider.  Ask your health care provider what activities are safe for you. A mix of physical activities may be best, such as walking, swimming, cycling, and strength training.  Try to add physical activity into your day. For example: ? Park in spots that are farther away than usual, so that you walk more. For example, park in a far corner of the parking lot when you go to the office or the grocery store. ? Take a walk during your lunch break. ? Use stairs instead of elevators or escalators. Weight Loss  Lose weight as directed. Your health care provider can determine how much weight loss is best for you and can help you lose weight safely.  If you are overweight or obese, you may be instructed to lose at least 5?7 % of your body weight. Alcohol and Tobacco   Limit alcohol intake to no more than 1 drink a day for nonpregnant women and 2 drinks a day for men. One drink equals 12 oz of beer, 5 oz of wine, or 1 oz of hard liquor.  Do not use any tobacco products, such as cigarettes, chewing tobacco, and e-cigarettes. If you need help quitting, ask your health care provider. Work With [place] Provider  Have your blood glucose tested regularly, as told by your health care provider.  Discuss your risk factors and how you can reduce your risk for diabetes.  Get screening tests as told by your health care provider. You may have screening tests regularly, especially if you have certain risk factors for type 2 diabetes.  Make an appointment with a diet and nutrition specialist (registered dietitian). A registered dietitian can help you make a healthy eating plan and can help you understand portion sizes and food labels. Why are these changes important?  It is possible to prevent or delay type 2 diabetes and related  health problems by making lifestyle and nutrition changes.  It can be difficult to recognize signs of type 2 diabetes. The best way to avoid possible damage to your body is to take actions to prevent the disease before you develop symptoms. What can happen if changes are not made?  Your blood glucose levels may keep increasing. Having high blood glucose for a long time is dangerous. Too much glucose in your blood can damage your blood vessels, heart, kidneys, nerves, and eyes.  You may develop prediabetes or type 2 diabetes. Type 2 diabetes can lead to many chronic health problems and complications, such as: ? Heart disease. ? Stroke. ? Blindness. ? Kidney disease. ? Depression. ?  Poor circulation in the feet and legs, which could lead to surgical removal (amputation) in severe cases. Where to find support  Ask your health care provider to recommend a registered dietitian, diabetes educator, or weight loss program.  Look for local or online weight loss groups.  Join a gym, fitness club, or outdoor activity group, such as a walking club. Where to find more information To learn more about diabetes and diabetes prevention, visit:  American Diabetes Association (ADA): www.diabetes.[employer] of Diabetes and Digestive and Kidney Diseases: FindSpin.nl To learn more about healthy eating, visit:  The U.S. Department of Agriculture Scientist, research (physical sciences)), Choose My Plate: http://wiley-williams.com/  Office of Disease Prevention and Health Promotion (ODPHP), Dietary Guidelines: SurferLive.at Summary  You can reduce your risk for type 2 diabetes by increasing your physical activity, eating healthy foods, and losing weight as directed.  Talk with your health care provider about your risk for type 2 diabetes. Ask about any blood tests or screening tests that you need to have. This information is not intended to replace advice given to  you by your health care provider. Make sure you discuss any questions you have with your health care provider. Document Released: 09/01/2015 Document Revised: 09/01/2018 Document Reviewed: 07/01/2015 Elsevier Patient Education  2020 [employer].

## 2019-01-16 LAB — CBC WITH DIFFERENTIAL/PLATELET
Absolute Monocytes: 605 cells/uL (ref 200–950)
Basophils Absolute: 38 cells/uL (ref 0–200)
Basophils Relative: 0.6 %
Eosinophils Absolute: 151 cells/uL (ref 15–500)
Eosinophils Relative: 2.4 %
HCT: 45 % (ref 38.5–50.0)
Hemoglobin: 14.9 g/dL (ref 13.2–17.1)
Lymphs Abs: 1840 cells/uL (ref 850–3900)
MCH: 27.7 pg (ref 27.0–33.0)
MCHC: 33.1 g/dL (ref 32.0–36.0)
MCV: 83.8 fL (ref 80.0–100.0)
MPV: 11.8 fL (ref 7.5–12.5)
Monocytes Relative: 9.6 %
Neutro Abs: 3667 cells/uL (ref 1500–7800)
Neutrophils Relative %: 58.2 %
Platelets: 253 10*3/uL (ref 140–400)
RBC: 5.37 10*6/uL (ref 4.20–5.80)
RDW: 14.7 % (ref 11.0–15.0)
Total Lymphocyte: 29.2 %
WBC: 6.3 10*3/uL (ref 3.8–10.8)

## 2019-01-16 LAB — LIPID PANEL
Cholesterol: 165 mg/dL (ref <200)
HDL: 39 mg/dL — ABNORMAL LOW (ref >=40)
LDL Cholesterol (Calc): 105 mg/dL (calc) — ABNORMAL HIGH
Non-HDL Cholesterol (Calc): 126 mg/dL (calc) (ref <130)
Total CHOL/HDL Ratio: 4.2 (calc) (ref <5.0)
Triglycerides: 110 mg/dL (ref <150)

## 2019-01-16 LAB — COMPLETE METABOLIC PANEL WITH GFR
AG Ratio: 1.6 (calc) (ref 1.0–2.5)
ALT: 37 U/L (ref 9–46)
AST: 33 U/L (ref 10–35)
Albumin: 4.4 g/dL (ref 3.6–5.1)
Alkaline phosphatase (APISO): 52 U/L (ref 35–144)
BUN: 14 mg/dL (ref 7–25)
CO2: 27 mmol/L (ref 20–32)
Calcium: 9.4 mg/dL (ref 8.6–10.3)
Chloride: 103 mmol/L (ref 98–110)
Creat: 1.2 mg/dL (ref 0.70–1.33)
GFR, Est African American: 81 mL/min/{1.73_m2} (ref >=60)
GFR, Est Non African American: 70 mL/min/{1.73_m2} (ref >=60)
Globulin: 2.8 g/dL (calc) (ref 1.9–3.7)
Glucose, Bld: 101 mg/dL — ABNORMAL HIGH (ref 65–99)
Potassium: 3.8 mmol/L (ref 3.5–5.3)
Sodium: 137 mmol/L (ref 135–146)
Total Bilirubin: 0.5 mg/dL (ref 0.2–1.2)
Total Protein: 7.2 g/dL (ref 6.1–8.1)

## 2019-01-16 LAB — MAGNESIUM: Magnesium: 2 mg/dL (ref 1.5–2.5)

## 2019-01-16 LAB — TSH: TSH: 2.06 mIU/L (ref 0.40–4.50)

## 2019-02-26 ENCOUNTER — Encounter (HOSPITAL_BASED_OUTPATIENT_CLINIC_OR_DEPARTMENT_OTHER): Payer: Self-pay | Admitting: *Deleted

## 2019-02-26 ENCOUNTER — Other Ambulatory Visit: Payer: Self-pay

## 2019-02-26 ENCOUNTER — Other Ambulatory Visit: Payer: Self-pay | Admitting: Otolaryngology

## 2019-02-26 NOTE — Progress Notes (Signed)
   02/26/19 1541  OBSTRUCTIVE SLEEP APNEA  Have you ever been diagnosed with sleep apnea through a sleep study? Yes  If yes, do you have and use a CPAP or BPAP machine every night? 0  Do you snore loudly (loud enough to be heard through closed doors)?  1  Do you often feel tired, fatigued, or sleepy during the daytime (such as falling asleep during driving or talking to someone)? 0  Has anyone observed you stop breathing during your sleep? 0  Do you have, or are you being treated for high blood pressure? 1  BMI more than 35 kg/m2? 1  Age > 50 (1-yes) 1  Neck circumference greater than:Male 16 inches or larger, Male 17inches or larger? 1  Male Gender (Yes=1) 1  Obstructive Sleep Apnea Score 6  Score 5 or greater  Results sent to PCP (Dr Melford Aase)

## 2019-02-27 ENCOUNTER — Encounter (HOSPITAL_BASED_OUTPATIENT_CLINIC_OR_DEPARTMENT_OTHER)
Admission: RE | Admit: 2019-02-27 | Discharge: 2019-02-27 | Disposition: A | Discharge status: Home / Self Care | Payer: 59 | Source: Ambulatory Visit | Attending: Otolaryngology | Admitting: Otolaryngology

## 2019-02-27 ENCOUNTER — Other Ambulatory Visit (HOSPITAL_COMMUNITY)
Admission: RE | Admit: 2019-02-27 | Discharge: 2019-02-27 | Disposition: A | Discharge status: Home / Self Care | Payer: 59 | Source: Ambulatory Visit | Attending: Otolaryngology | Admitting: Otolaryngology

## 2019-02-27 DIAGNOSIS — Z20828 Contact with and (suspected) exposure to other viral communicable diseases: Secondary | ICD-10-CM | POA: Diagnosis not present

## 2019-02-27 DIAGNOSIS — Z01812 Encounter for preprocedural laboratory examination: Secondary | ICD-10-CM | POA: Diagnosis not present

## 2019-02-27 LAB — BASIC METABOLIC PANEL
Anion gap: 9 (ref 5–15)
BUN: 15 mg/dL (ref 6–20)
CO2: 25 mmol/L (ref 22–32)
Calcium: 9.1 mg/dL (ref 8.9–10.3)
Chloride: 104 mmol/L (ref 98–111)
Creatinine, Ser: 1.16 mg/dL (ref 0.61–1.24)
GFR calc Af Amer: >60 mL/min (ref >60)
GFR calc non Af Amer: >60 mL/min (ref >60)
Glucose, Bld: 96 mg/dL (ref 70–99)
Potassium: 4.5 mmol/L (ref 3.5–5.1)
Sodium: 138 mmol/L (ref 135–145)

## 2019-02-28 LAB — NOVEL CORONAVIRUS, NAA (HOSP ORDER, SEND-OUT TO REF LAB; TAT 18-24 HRS): SARS-CoV-2, NAA: NOT DETECTED

## 2019-03-02 ENCOUNTER — Encounter (HOSPITAL_BASED_OUTPATIENT_CLINIC_OR_DEPARTMENT_OTHER): Payer: Self-pay | Admitting: *Deleted

## 2019-03-02 ENCOUNTER — Ambulatory Visit (HOSPITAL_BASED_OUTPATIENT_CLINIC_OR_DEPARTMENT_OTHER)
Admission: RE | Admit: 2019-03-02 | Discharge: 2019-03-02 | Disposition: A | Discharge status: Home / Self Care | Payer: 59 | Source: Ambulatory Visit | Attending: Otolaryngology | Admitting: Otolaryngology

## 2019-03-02 ENCOUNTER — Encounter (HOSPITAL_BASED_OUTPATIENT_CLINIC_OR_DEPARTMENT_OTHER)
Admission: RE | Disposition: A | Discharge status: Home / Self Care | Payer: Self-pay | Source: Ambulatory Visit | Attending: Otolaryngology

## 2019-03-02 ENCOUNTER — Ambulatory Visit (HOSPITAL_BASED_OUTPATIENT_CLINIC_OR_DEPARTMENT_OTHER): Payer: 59 | Admitting: Certified Registered Nurse Anesthetist

## 2019-03-02 ENCOUNTER — Other Ambulatory Visit: Payer: Self-pay

## 2019-03-02 DIAGNOSIS — K219 Gastro-esophageal reflux disease without esophagitis: Secondary | ICD-10-CM | POA: Diagnosis not present

## 2019-03-02 DIAGNOSIS — I10 Essential (primary) hypertension: Secondary | ICD-10-CM | POA: Insufficient documentation

## 2019-03-02 DIAGNOSIS — E785 Hyperlipidemia, unspecified: Secondary | ICD-10-CM | POA: Diagnosis not present

## 2019-03-02 DIAGNOSIS — Z6834 Body mass index (BMI) 34.0-34.9, adult: Secondary | ICD-10-CM | POA: Insufficient documentation

## 2019-03-02 DIAGNOSIS — E669 Obesity, unspecified: Secondary | ICD-10-CM | POA: Insufficient documentation

## 2019-03-02 DIAGNOSIS — Z79899 Other long term (current) drug therapy: Secondary | ICD-10-CM | POA: Insufficient documentation

## 2019-03-02 DIAGNOSIS — F4310 Post-traumatic stress disorder, unspecified: Secondary | ICD-10-CM | POA: Insufficient documentation

## 2019-03-02 DIAGNOSIS — G4733 Obstructive sleep apnea (adult) (pediatric): Secondary | ICD-10-CM | POA: Insufficient documentation

## 2019-03-02 HISTORY — PX: DRUG INDUCED ENDOSCOPY: SHX6808

## 2019-03-02 SURGERY — DRUG INDUCED SLEEP ENDOSCOPY
Anesthesia: Monitor Anesthesia Care | Site: Nose

## 2019-03-02 MED ORDER — PROMETHAZINE HCL 25 MG/ML IJ SOLN: 6.2500 mg | INTRAMUSCULAR | Status: DC | PRN

## 2019-03-02 MED ORDER — LACTATED RINGERS IV SOLN
INTRAVENOUS | Status: DC
  Administered 2019-03-02: 10:00:00 via INTRAVENOUS

## 2019-03-02 MED ORDER — GLYCOPYRROLATE 0.2 MG/ML IJ SOLN
INTRAMUSCULAR | Status: DC | PRN
  Administered 2019-03-02: .1 mg via INTRAVENOUS

## 2019-03-02 MED ORDER — MIDAZOLAM HCL 2 MG/2ML IJ SOLN: 1.0000 mg | INTRAMUSCULAR | Status: DC | PRN

## 2019-03-02 MED ORDER — SCOPOLAMINE 1 MG/3DAYS TD PT72: 1.0000 | MEDICATED_PATCH | Freq: Once | TRANSDERMAL | Status: DC

## 2019-03-02 MED ORDER — PROPOFOL 500 MG/50ML IV EMUL
INTRAVENOUS | Status: DC | PRN
  Administered 2019-03-02: 35 ug/kg/min via INTRAVENOUS

## 2019-03-02 MED ORDER — FENTANYL CITRATE (PF) 100 MCG/2ML IJ SOLN: 25.0000 ug | INTRAMUSCULAR | Status: DC | PRN

## 2019-03-02 MED ORDER — FENTANYL CITRATE (PF) 100 MCG/2ML IJ SOLN: 50.0000 ug | INTRAMUSCULAR | Status: DC | PRN

## 2019-03-02 MED ORDER — OXYMETAZOLINE HCL 0.05 % NA SOLN
NASAL | Status: AC
  Filled 2019-03-02: qty 30

## 2019-03-02 MED ORDER — PROPOFOL 10 MG/ML IV BOLUS
INTRAVENOUS | Status: DC | PRN
  Administered 2019-03-02: 20 mg via INTRAVENOUS

## 2019-03-02 SURGICAL SUPPLY — 16 items
CANISTER SUCT 1200ML W/VALVE (MISCELLANEOUS) ×3 IMPLANT
COVER WAND RF STERILE (DRAPES) IMPLANT
DRAPE HALF SHEET 70X43 (DRAPES) IMPLANT
GLOVE BIO SURGEON STRL SZ7 (GLOVE) ×2 IMPLANT
GLOVE BIO SURGEON STRL SZ7.5 (GLOVE) ×3 IMPLANT
KIT CLEAN ENDO (MISCELLANEOUS) ×3 IMPLANT
NDL PRECISIONGLIDE 27X1.5 (NEEDLE) IMPLANT
NEEDLE PRECISIONGLIDE 27X1.5 (NEEDLE) IMPLANT
PACK BASIN DAY SURGERY FS (CUSTOM PROCEDURE TRAY) ×3 IMPLANT
PATTIES SURGICAL .5 X3 (DISPOSABLE) ×3 IMPLANT
SOL ANTI FOG 6CC (MISCELLANEOUS) ×1 IMPLANT
SOLUTION ANTI FOG 6CC (MISCELLANEOUS) ×2
SYR CONTROL 10ML LL (SYRINGE) IMPLANT
TOWEL GREEN STERILE FF (TOWEL DISPOSABLE) ×3 IMPLANT
TUBE CONNECTING 20'X1/4 (TUBING) ×1
TUBE CONNECTING 20X1/4 (TUBING) ×2 IMPLANT

## 2019-03-02 NOTE — Anesthesia Preprocedure Evaluation (Signed)
Anesthesia Evaluation  Patient identified by MRN, date of birth, ID band Patient awake    Reviewed: Allergy & Precautions, NPO status , Patient's Chart, lab work & pertinent test results  Airway Mallampati: II  TM Distance: >3 FB Neck ROM: Full    Dental  (+) Teeth Intact, Dental Advisory Given   Pulmonary sleep apnea ,    Pulmonary exam normal breath sounds clear to auscultation       Cardiovascular hypertension, Pt. on medications (-) angina(-) CAD, (-) Past MI and (-) Cardiac Stents Normal cardiovascular exam Rhythm:Regular Rate:Normal     Neuro/Psych PSYCHIATRIC DISORDERS Anxiety negative neurological ROS     GI/Hepatic Neg liver ROS, GERD  Medicated and Controlled,  Endo/Other  Obesity   Renal/GU negative Renal ROS     Musculoskeletal negative musculoskeletal ROS (+)   Abdominal   Peds  Hematology negative hematology ROS (+)   Anesthesia Other Findings Day of surgery medications reviewed with the patient.  Reproductive/Obstetrics                             Anesthesia Physical Anesthesia Plan  ASA: II  Anesthesia Plan: MAC   Post-op Pain Management:    Induction: Intravenous  PONV Risk Score and Plan: 1 and Propofol infusion and Treatment may vary due to age or medical condition  Airway Management Planned: Nasal Cannula and Natural Airway  Additional Equipment:   Intra-op Plan:   Post-operative Plan:   Informed Consent: I have reviewed the patients History and Physical, chart, labs and discussed the procedure including the risks, benefits and alternatives for the proposed anesthesia with the patient or authorized representative who has indicated his/her understanding and acceptance.     Dental advisory given  Plan Discussed with: CRNA and Anesthesiologist  Anesthesia Plan Comments:         Anesthesia Quick Evaluation

## 2019-03-02 NOTE — Transfer of Care (Signed)
Immediate Anesthesia Transfer of Care Note  Patient: Craig [name]  Procedure(s) Performed: DRUG INDUCED ENDOSCOPY (N/A Nose)  Patient Location: PACU  Anesthesia Type:MAC  Level of Consciousness: awake, alert , oriented and patient cooperative  Airway & Oxygen Therapy: Patient Spontanous Breathing  Post-op Assessment: Report given to RN and Post -op Vital signs reviewed and stable  Post vital signs: Reviewed and stable  Last Vitals:  Vitals Value Taken Time  BP 121/85 03/02/19 1145  Temp    Pulse 75 03/02/19 1146  Resp 18 03/02/19 1146  SpO2 91 % 03/02/19 1146  Vitals shown include unvalidated device data.  Last Pain:  Vitals:   03/02/19 0930  TempSrc: Oral  PainSc: 0-No pain      Patients Stated Pain Goal: 0 (53/79/43 2761)  Complications: No apparent anesthesia complications

## 2019-03-02 NOTE — H&P (Signed)
Craig [name] is an 53 y.o. male.   Chief Complaint: OSA HPI: 53 year old male with obstructive sleep apnea who has been unable to tolerate CPAP.  He presents for sleep endoscopy.  Past Medical History:  Diagnosis Date  . 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 GERD (gastroesophageal reflux disease)   . 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 Hyperlipidemia   . Hypertension   . Hypogonadism male   . Lumbar spinal stenosis 07/31/2014  . Pre-diabetes   . PTSD (post-traumatic stress disorder)   . Sleep apnea    pt does not use CPAP  . Tinnitus   . Vertigo     Past Surgical History:  Procedure Laterality Date  . BUNIONECTOMY Bilateral   . CATARACT EXTRACTION Right    2016  . CATARACT EXTRACTION W/ INTRAOCULAR LENS IMPLANT Left 2014  . LUMBAR LAMINECTOMY/DECOMPRESSION MICRODISCECTOMY Left 07/31/2014   Procedure: LUMBAR LAMINECTOMY/DECOMPRESSION MICRODISCECTOMY 1 LEVEL L5-S1 ON LEFT;  Surgeon: Susa Day, MD;  Location: [facility];  Service: Orthopedics;  Laterality: Left;  . PARTIAL NEPHRECTOMY Left 2008  . TOTAL HIP ARTHROPLASTY Right 2007    Family History  Problem Relation Age of Onset  . Breast cancer Mother   . Hypertension Maternal Grandmother   . Breast cancer Paternal Grandmother    Social History:  reports that he has never smoked. He has never used smokeless tobacco. He reports that he does not drink alcohol or use drugs.  Allergies:  Allergies  Allergen Reactions  . Ziac [Bisoprolol-Hydrochlorothiazide]     Erectile dysfunction    Medications Prior to Admission  Medication Sig Dispense Refill  . cetirizine (ZYRTEC) 10 MG tablet Take 1 tablet (10 mg total) by mouth daily. 90 tablet 4  . Cholecalciferol (VITAMIN D-3) 5000 UNITS TABS Take 10,000 Units by mouth daily.     . Cinnamon 500 MG TABS Take by mouth.    Marland Kitchen lisinopril-hydrochlorothiazide (PRINZIDE,ZESTORETIC) 20-12.5 MG tablet  Take 1 tablet by mouth daily.    . Magnesium 500 MG CAPS Take 500 mg by mouth daily.    . nortriptyline (PAMELOR) 25 MG capsule Take 25 mg by mouth at bedtime.    Marland Kitchen OVER THE COUNTER MEDICATION as needed. OTC Acid Reducer    . SUMAtriptan (IMITREX) 25 MG tablet Take 25 mg by mouth daily. May repeat in 2 hours if headache persists or recurs.     . Zinc 50 MG TABS Take by mouth daily.    . tadalafil (CIALIS) 20 MG tablet Take 1/2-1 tab once daily for erectile dysfunction. 30 tablet 0    No results found for this or any previous visit (from the past 48 hour(s)). No results found.  Review of Systems  All other systems reviewed and are negative.   Blood pressure 128/87, pulse 75, temperature (!) 97 F (36.1 C), temperature source Oral, resp. rate 20, height 6\' 1"  (1.854 m), weight 118.1 kg, SpO2 100 %. Physical Exam  Constitutional: He is oriented to person, place, and time. He appears well-developed and well-nourished. No distress.  HENT:  Head: Normocephalic and atraumatic.  Right Ear: External ear normal.  Left Ear: External ear normal.  Nose: Nose normal.  Mouth/Throat: Oropharynx is clear and moist.  Eyes: Pupils are equal, round, and reactive to light. Conjunctivae and EOM are normal.  Neck: Normal range of motion. Neck supple.  Cardiovascular: Normal rate.  Respiratory: Effort normal.  Neurological: He is alert and oriented to person, place, and time. No cranial nerve deficit.  Skin: Skin is warm and dry.  Psychiatric: He has a normal mood and affect. His behavior is normal. Judgment and thought content normal.     Assessment/Plan OSA  To OR for DISE.  [name], MD 03/02/2019, 10:37 AM

## 2019-03-02 NOTE — Brief Op Note (Signed)
03/02/2019  11:43 AM  PATIENT:  Craig [name]  53 y.o. male  PRE-OPERATIVE DIAGNOSIS:  obstructive sleep apna  POST-OPERATIVE DIAGNOSIS:  obstructive sleep apna  PROCEDURE:  Procedure(s): DRUG INDUCED ENDOSCOPY (N/A)  SURGEON:  Surgeon(s) and Role:    [name], MD - Primary  PHYSICIAN ASSISTANT:   ASSISTANTS: none   ANESTHESIA:   IV sedation  EBL: None  BLOOD ADMINISTERED:none  DRAINS: none   LOCAL MEDICATIONS USED:  NONE  SPECIMEN:  No Specimen  DISPOSITION OF SPECIMEN:  N/A  COUNTS:  YES  TOURNIQUET:  * No tourniquets in log *  DICTATION: .Other Dictation: Dictation Number ?  PLAN OF CARE: Discharge to home after PACU  PATIENT DISPOSITION:  PACU - hemodynamically stable.   Delay start of Pharmacological VTE agent (>24hrs) due to surgical blood loss or risk of bleeding: no

## 2019-03-02 NOTE — Discharge Instructions (Signed)

## 2019-03-02 NOTE — Anesthesia Postprocedure Evaluation (Signed)
Anesthesia Post Note  Patient: Craig [name]  Procedure(s) Performed: DRUG INDUCED ENDOSCOPY (N/A Nose)     Patient location during evaluation: PACU Anesthesia Type: MAC Level of consciousness: awake and alert Pain management: pain level controlled Vital Signs Assessment: post-procedure vital signs reviewed and stable Respiratory status: spontaneous breathing, nonlabored ventilation and respiratory function stable Cardiovascular status: stable and blood pressure returned to baseline Postop Assessment: no apparent nausea or vomiting Anesthetic complications: no    Last Vitals:  Vitals:   03/02/19 1200 03/02/19 1215  BP: (!) 120/92 125/78  Pulse: (!) 58 (!) 54  Resp: 16 16  Temp:  (!) 36.4 C  SpO2: 98% 100%    Last Pain:  Vitals:   03/02/19 1215  TempSrc: Oral  PainSc: 0-No pain                 Catalina Gravel

## 2019-03-02 NOTE — Op Note (Signed)
NAME: Craig [name], Craig [name] MEDICAL RECORD [id number] ACCOUNT [id number] DATE OF BIRTH:September 01, 1965 FACILITY: [facility] LOCATION: [facility] PHYSICIAN:Nils Thor Guido Sander, MD  OPERATIVE REPORT  DATE OF PROCEDURE:  03/02/2019  PREOPERATIVE DIAGNOSIS:  Obstructive sleep apnea.  POSTOPERATIVE DIAGNOSIS:  Obstructive sleep apnea.  PROCEDURE:  Drug-induced sleep endoscopy.  SURGEON:  [name], MD  ANESTHESIA:  IV sedation.  COMPLICATIONS:  None.  INDICATIONS:  The patient is a 53 year old male who has a diagnosis of obstructive sleep apnea with an AHI of 58.7 who unfortunately has been unable to tolerate CPAP due to PTSD related to [employer].  He presents to the operating room for  diagnostic sleep endoscopy.  FINDINGS:  His exam was reviewed by a secondary reviewer.  At the level of the velum, there is some degree of side wall collapse that is felt to represent less than 75% of his collapse.  The genu of the velum does not collapse majority from the side walls but more in an anterior-posterior direction.  These findings make him a potential candidate for Inspire placement.  The tongue base did not really collapse, but there was some lateral wall collapse at that level that was not obstructive really.  DESCRIPTION OF PROCEDURE:  The patient was identified in the holding room, informed consent having been obtained, including discussion of risks, benefits, and alternatives.  The patient was brought to the operative suite and put the operative table in  supine position.  IV sedation was induced, and the patient was brought down to a level of simulated sleep.  An Afrin-soaked pledget was placed in the right side of the nose for several minutes and then removed.  The flexible laryngoscope was then passed  through the right nasal passage to view the pharynx and larynx.  Findings are noted above.  The exam was recorded.  After this was completed, the scope was removed, and the patient was returned to  anesthesia for wakeup and was moved to recovery room in  stable condition.  LN/NUANCE  D:03/02/2019 T:03/02/2019 JOB:[id number]

## 2019-03-05 ENCOUNTER — Encounter (HOSPITAL_BASED_OUTPATIENT_CLINIC_OR_DEPARTMENT_OTHER): Payer: Self-pay | Admitting: Otolaryngology

## 2019-03-06 ENCOUNTER — Ambulatory Visit (INDEPENDENT_AMBULATORY_CARE_PROVIDER_SITE_OTHER): Payer: 59 | Admitting: Podiatry

## 2019-03-06 ENCOUNTER — Ambulatory Visit (INDEPENDENT_AMBULATORY_CARE_PROVIDER_SITE_OTHER): Payer: 59

## 2019-03-06 ENCOUNTER — Ambulatory Visit: Payer: 59

## 2019-03-06 ENCOUNTER — Encounter: Payer: Self-pay | Admitting: Podiatry

## 2019-03-06 ENCOUNTER — Other Ambulatory Visit: Payer: Self-pay

## 2019-03-06 DIAGNOSIS — M722 Plantar fascial fibromatosis: Secondary | ICD-10-CM

## 2019-03-06 MED ORDER — MELOXICAM 15 MG PO TABS: 15.0000 mg | ORAL_TABLET | Freq: Every day | ORAL | 1 refills | Status: DC

## 2019-03-11 NOTE — Progress Notes (Signed)
   Subjective: 53 y.o. male presenting today as a new patient with a chief complaint of aching, throbbing pain noted to the bilateral heels, left worse than right, that began about one week ago. He states the pain sometimes radiates to the arches. He states the pain is worse in the morning when he first gets out of bed. Standing after being seated for a long period of time also increases the pain. He has been using ice therapy and has taken Aleve for treatment. Patient is here for further evaluation and treatment.    Past Medical History:  Diagnosis Date  . 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 GERD (gastroesophageal reflux disease)   . 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 Hyperlipidemia   . Hypertension   . Hypogonadism male   . Lumbar spinal stenosis 07/31/2014  . Pre-diabetes   . PTSD (post-traumatic stress disorder)   . Sleep apnea    pt does not use CPAP  . Tinnitus   . Vertigo      Objective: Physical Exam General: The patient is alert and oriented x3 in no acute distress.  Dermatology: Skin is warm, dry and supple bilateral lower extremities. Negative for open lesions or macerations bilateral.   Vascular: Dorsalis Pedis and Posterior Tibial pulses palpable bilateral.  Capillary fill time is immediate to all digits.  Neurological: Epicritic and protective threshold intact bilateral.   Musculoskeletal: Tenderness to palpation to the plantar aspect of the bilateral heels along the plantar fascia. All other joints range of motion within normal limits bilateral. Strength 5/5 in all groups bilateral.   Radiographic exam: Normal osseous mineralization. Joint spaces preserved. No fracture/dislocation/boney destruction. No other soft tissue abnormalities or radiopaque foreign bodies.   Assessment: 1. plantar fasciitis bilateral feet, left worse than right   Plan of Care:  1. Patient evaluated.  Xrays reviewed.   2. Injection of 0.5cc Celestone soluspan injected into the left heel.  3. OTC insoles provided.  4. Rx for Meloxicam ordered for patient. 5. Recommended good shoe gear.  6. Return to clinic as needed.    Edrick Kins, DPM [facility]  Dr. Edrick Kins, DPM    [address], [place] [address]                Office [phone]  Fax [phone]

## 2019-04-23 ENCOUNTER — Other Ambulatory Visit: Payer: Self-pay | Admitting: Otolaryngology

## 2019-04-27 NOTE — Progress Notes (Signed)
[id number] [address]tha Harm, [place] [address] Phone: [phone] Fax: [place] # [address] [place] [address] [place] [address] Phone: [phone] Fax: [place], [place] [facility] [address] [place] [address] Phone: [phone] Fax: [phone]      Your procedure is scheduled on Wednesday, December 9th, 2020.   Report to [facility] Main Entrance "A" at 6:40 A.M., and check in at the Admitting office.   Call this number if you have problems the morning of surgery:  [phone]  Call [phone] if you have any questions prior to your surgery date Monday-Friday 8am-4pm    Remember:  Do not eat or drink after midnight the night before your surgery    Take these medicines the morning of surgery with A SIP OF WATER :  Carboxymethylcellulose (Refresh Tears) - if needed Cetirizine (Zyrtec) Lansoprazole (Prevacid) Loratadine (Claritin) Nortriptyline (Pamelor) - if needed  7 days prior to surgery STOP taking any Aspirin (unless otherwise instructed by your surgeon), Aleve, Naproxen, Ibuprofen, Motrin, Advil, Goody's, BC's, all herbal medications, fish oil, and all vitamins.    The Morning of Surgery  Do not wear jewelry.  Do not wear lotions, powders, colognes, or deodorant  Do not shave 48 hours prior to surgery.  Men may shave face and neck.  Do not bring valuables to the hospital.  [facility] is not responsible for any belongings or valuables.  If you are a smoker, DO NOT Smoke 24 hours prior to surgery  If you wear a CPAP at night please bring your mask, tubing, and machine the morning of surgery   Remember that you must have someone to transport you home after your surgery, and remain with you for 24 hours if you are discharged the same day.   Please bring cases for contacts, glasses,  hearing aids, dentures or bridgework because it cannot be worn into surgery.    Leave your suitcase in the car.  After surgery it may be brought to your room.  For patients admitted to the hospital, discharge time will be determined by your treatment team.  Patients discharged the day of surgery will not be allowed to drive home.    Special instructions:   [facility]- Preparing For Surgery  Before surgery, you can play an important role. Because skin is not sterile, your skin needs to be as free of germs as possible. You can reduce the number of germs on your skin by washing with CHG (chlorahexidine gluconate) Soap before surgery.  CHG is an antiseptic cleaner which kills germs and bonds with the skin to continue killing germs even after washing.    Oral Hygiene is also important to reduce your risk of infection.  Remember - BRUSH YOUR TEETH THE MORNING OF SURGERY WITH YOUR REGULAR TOOTHPASTE  Please do not use if you have an allergy to CHG or antibacterial soaps. If your skin becomes reddened/irritated stop using the CHG.  Do not shave (including legs and underarms) for at least 48 hours prior to first CHG shower. It is OK to shave your face.  Please follow these instructions carefully.   1. Shower the NIGHT BEFORE SURGERY and the MORNING OF SURGERY with CHG Soap.   2. If you chose to wash your hair, wash your hair first as usual with your normal shampoo.  3. After you shampoo,  rinse your hair and body thoroughly to remove the shampoo.  4. Use CHG as you would any other liquid soap. You can apply CHG directly to the skin and wash gently with a scrungie or a clean washcloth.   5. Apply the CHG Soap to your body ONLY FROM THE NECK DOWN.  Do not use on open wounds or open sores. Avoid contact with your eyes, ears, mouth and genitals (private parts). Wash Face and genitals (private parts)  with your normal soap.   6. Wash thoroughly, paying special attention to the area where your  surgery will be performed.  7. Thoroughly rinse your body with warm water from the neck down.  8. DO NOT shower/wash with your normal soap after using and rinsing off the CHG Soap.  9. Pat yourself dry with a CLEAN TOWEL.  10. Wear CLEAN PAJAMAS to bed the night before surgery, wear comfortable clothes the morning of surgery  11. Place CLEAN SHEETS on your bed the night of your first shower and DO NOT SLEEP WITH PETS.    Day of Surgery:  Please shower the morning of surgery with the CHG soap Do not apply any deodorants/lotions. Please wear clean clothes to the hospital/surgery center.   Remember to brush your teeth WITH YOUR REGULAR TOOTHPASTE.   Please read over the following fact sheets that you were given.

## 2019-04-30 ENCOUNTER — Other Ambulatory Visit: Payer: Self-pay

## 2019-04-30 ENCOUNTER — Other Ambulatory Visit (HOSPITAL_COMMUNITY)
Admission: RE | Admit: 2019-04-30 | Discharge: 2019-04-30 | Disposition: A | Discharge status: Home / Self Care | Payer: 59 | Source: Ambulatory Visit | Attending: Otolaryngology | Admitting: Otolaryngology

## 2019-04-30 ENCOUNTER — Encounter (HOSPITAL_COMMUNITY): Payer: Self-pay

## 2019-04-30 ENCOUNTER — Encounter (HOSPITAL_COMMUNITY)
Admission: RE | Admit: 2019-04-30 | Discharge: 2019-04-30 | Disposition: A | Discharge status: Home / Self Care | Payer: 59 | Source: Ambulatory Visit | Attending: Otolaryngology | Admitting: Otolaryngology

## 2019-04-30 DIAGNOSIS — I10 Essential (primary) hypertension: Secondary | ICD-10-CM | POA: Diagnosis not present

## 2019-04-30 DIAGNOSIS — G4733 Obstructive sleep apnea (adult) (pediatric): Secondary | ICD-10-CM | POA: Insufficient documentation

## 2019-04-30 DIAGNOSIS — Z01818 Encounter for other preprocedural examination: Secondary | ICD-10-CM | POA: Diagnosis not present

## 2019-04-30 LAB — BASIC METABOLIC PANEL
Anion gap: 11 (ref 5–15)
BUN: 15 mg/dL (ref 6–20)
CO2: 23 mmol/L (ref 22–32)
Calcium: 8.8 mg/dL — ABNORMAL LOW (ref 8.9–10.3)
Chloride: 106 mmol/L (ref 98–111)
Creatinine, Ser: 1.22 mg/dL (ref 0.61–1.24)
GFR calc Af Amer: >60 mL/min (ref >60)
GFR calc non Af Amer: >60 mL/min (ref >60)
Glucose, Bld: 104 mg/dL — ABNORMAL HIGH (ref 70–99)
Potassium: 3.9 mmol/L (ref 3.5–5.1)
Sodium: 140 mmol/L (ref 135–145)

## 2019-04-30 LAB — CBC
HCT: 46.5 % (ref 39.0–52.0)
Hemoglobin: 15.2 g/dL (ref 13.0–17.0)
MCH: 27.1 pg (ref 26.0–34.0)
MCHC: 32.7 g/dL (ref 30.0–36.0)
MCV: 83 fL (ref 80.0–100.0)
Platelets: 252 10*3/uL (ref 150–400)
RBC: 5.6 MIL/uL (ref 4.22–5.81)
RDW: 15.7 % — ABNORMAL HIGH (ref 11.5–15.5)
WBC: 7.2 10*3/uL (ref 4.0–10.5)
nRBC: 0 % (ref 0.0–0.2)

## 2019-04-30 LAB — HEMOGLOBIN A1C
Hgb A1c MFr Bld: 6.3 % — ABNORMAL HIGH (ref 4.8–5.6)
Mean Plasma Glucose: 134.11 mg/dL

## 2019-04-30 LAB — SARS CORONAVIRUS 2 (TAT 6-24 HRS): SARS Coronavirus 2: NEGATIVE

## 2019-04-30 NOTE — Progress Notes (Signed)
PCP - Unk Pinto, MD Cardiologist - denies  Chest x-ray - N/A EKG - 04/30/19 Stress Test - denies ECHO - denies Cardiac Cath - denies  Sleep Study - 03/13/2013 CPAP - unable to use d/t PTSD  Blood Thinner Instructions: N/A Aspirin Instructions: N/A  COVID TEST- scheduled after PAT 04/30/19; aware of need for self-quarantine  Coronavirus Screening  Have you experienced the following symptoms:  Cough yes/no: No Fever (>100.33F)  yes/no: No Runny nose yes/no: No Sore throat yes/no: No Difficulty breathing/shortness of breath  yes/no: No  Have you or a family member traveled in the last 14 days and where? yes/no: No           If the patient indicates "YES" to the above questions, their PAT will be rescheduled to limit the exposure to others and, the surgeon will be notified. THE PATIENT WILL NEED TO BE ASYMPTOMATIC FOR 14 DAYS.   If the patient is not experiencing any of these symptoms, the PAT nurse will instruct them to NOT bring anyone with them to their appointment since they may have these symptoms or traveled as well.   Please remind your patients and families that hospital visitation restrictions are in effect and the importance of the restrictions.   Anesthesia review: No  Patient denies shortness of breath, fever, cough and chest pain at PAT appointment   All instructions explained to the patient, with a verbal understanding of the material. Patient agrees to go over the instructions while at home for a better understanding. Patient also instructed to self quarantine after being tested for COVID-19. The opportunity to ask questions was provided.

## 2019-05-01 NOTE — Anesthesia Preprocedure Evaluation (Addendum)
Anesthesia Evaluation  Patient identified by MRN, date of birth, ID band Patient awake    Reviewed: Allergy & Precautions, NPO status , Patient's Chart, lab work & pertinent test results  Airway Mallampati: II  TM Distance: >3 FB Neck ROM: Full    Dental  (+) Teeth Intact, Dental Advisory Given   Pulmonary sleep apnea ,  Not using CPAP currently   Pulmonary exam normal breath sounds clear to auscultation       Cardiovascular hypertension, Pt. on medications (-) angina(-) CAD, (-) Past MI and (-) Cardiac Stents Normal cardiovascular exam Rhythm:Regular Rate:Normal     Neuro/Psych PSYCHIATRIC DISORDERS Anxiety negative neurological ROS     GI/Hepatic Neg liver ROS, GERD  Medicated and Controlled,  Endo/Other  Obesity BMI 35  Pre-diabetic  Renal/GU negative Renal ROS  negative genitourinary   Musculoskeletal Lumbar spinal stenosis   Abdominal Normal abdominal exam  (+) + obese,   Peds  Hematology negative hematology ROS (+)   Anesthesia Other Findings HLD  Reproductive/Obstetrics negative OB ROS                            Anesthesia Physical  Anesthesia Plan  ASA: III  Anesthesia Plan: General   Post-op Pain Management:    Induction: Intravenous  PONV Risk Score and Plan: 1 and Propofol infusion and Treatment may vary due to age or medical condition  Airway Management Planned: Oral ETT  Additional Equipment: None  Intra-op Plan:   Post-operative Plan: Extubation in OR  Informed Consent: I have reviewed the patients History and Physical, chart, labs and discussed the procedure including the risks, benefits and alternatives for the proposed anesthesia with the patient or authorized representative who has indicated his/her understanding and acceptance.     Dental advisory given  Plan Discussed with: CRNA  Anesthesia Plan Comments:         Anesthesia Quick  Evaluation

## 2019-05-02 ENCOUNTER — Ambulatory Visit (HOSPITAL_COMMUNITY)
Admission: RE | Admit: 2019-05-02 | Discharge: 2019-05-02 | Disposition: A | Discharge status: Home / Self Care | Payer: 59 | Source: Ambulatory Visit | Attending: Otolaryngology | Admitting: Otolaryngology

## 2019-05-02 ENCOUNTER — Encounter (HOSPITAL_COMMUNITY)
Admission: RE | Disposition: A | Discharge status: Home / Self Care | Payer: Self-pay | Source: Ambulatory Visit | Attending: Otolaryngology

## 2019-05-02 ENCOUNTER — Other Ambulatory Visit: Payer: Self-pay

## 2019-05-02 ENCOUNTER — Ambulatory Visit (HOSPITAL_COMMUNITY): Payer: 59 | Admitting: Physician Assistant

## 2019-05-02 ENCOUNTER — Ambulatory Visit (HOSPITAL_COMMUNITY): Payer: 59

## 2019-05-02 ENCOUNTER — Ambulatory Visit (HOSPITAL_COMMUNITY): Payer: 59 | Admitting: Anesthesiology

## 2019-05-02 ENCOUNTER — Encounter (HOSPITAL_COMMUNITY): Payer: Self-pay

## 2019-05-02 DIAGNOSIS — G4733 Obstructive sleep apnea (adult) (pediatric): Secondary | ICD-10-CM | POA: Insufficient documentation

## 2019-05-02 DIAGNOSIS — Z96641 Presence of right artificial hip joint: Secondary | ICD-10-CM | POA: Insufficient documentation

## 2019-05-02 DIAGNOSIS — R7303 Prediabetes: Secondary | ICD-10-CM | POA: Insufficient documentation

## 2019-05-02 DIAGNOSIS — E669 Obesity, unspecified: Secondary | ICD-10-CM | POA: Insufficient documentation

## 2019-05-02 DIAGNOSIS — K219 Gastro-esophageal reflux disease without esophagitis: Secondary | ICD-10-CM | POA: Diagnosis not present

## 2019-05-02 DIAGNOSIS — Z6834 Body mass index (BMI) 34.0-34.9, adult: Secondary | ICD-10-CM | POA: Diagnosis not present

## 2019-05-02 DIAGNOSIS — E785 Hyperlipidemia, unspecified: Secondary | ICD-10-CM | POA: Insufficient documentation

## 2019-05-02 DIAGNOSIS — Z79899 Other long term (current) drug therapy: Secondary | ICD-10-CM | POA: Insufficient documentation

## 2019-05-02 DIAGNOSIS — I10 Essential (primary) hypertension: Secondary | ICD-10-CM | POA: Insufficient documentation

## 2019-05-02 HISTORY — PX: IMPLANTATION OF HYPOGLOSSAL NERVE STIMULATOR: SHX6827

## 2019-05-02 LAB — GLUCOSE, CAPILLARY: Glucose-Capillary: 121 mg/dL — ABNORMAL HIGH (ref 70–99)

## 2019-05-02 SURGERY — INSERTION, HYPOGLOSSAL NERVE STIMULATOR
Anesthesia: General | Site: Chest | Laterality: Right

## 2019-05-02 MED ORDER — OXYCODONE HCL 5 MG PO TABS
5.0000 mg | ORAL_TABLET | Freq: Once | ORAL | Status: AC | PRN
  Administered 2019-05-02: 5 mg via ORAL

## 2019-05-02 MED ORDER — PHENYLEPHRINE HCL-NACL 10-0.9 MG/250ML-% IV SOLN
INTRAVENOUS | Status: DC | PRN
  Administered 2019-05-02: 50 ug/min via INTRAVENOUS

## 2019-05-02 MED ORDER — HYDROMORPHONE HCL 1 MG/ML IJ SOLN
0.2500 mg | INTRAMUSCULAR | Status: DC | PRN
  Administered 2019-05-02 (×2): 0.5 mg via INTRAVENOUS

## 2019-05-02 MED ORDER — PROPOFOL 10 MG/ML IV BOLUS
INTRAVENOUS | Status: AC
  Filled 2019-05-02: qty 20

## 2019-05-02 MED ORDER — OXYCODONE HCL 5 MG/5ML PO SOLN: 5.0000 mg | Freq: Once | ORAL | Status: AC | PRN

## 2019-05-02 MED ORDER — SODIUM CHLORIDE 0.9 % IV SOLN
INTRAVENOUS | Status: DC | PRN
  Administered 2019-05-02: 500 mL

## 2019-05-02 MED ORDER — OXYCODONE HCL 5 MG PO TABS
ORAL_TABLET | ORAL | Status: AC
  Filled 2019-05-02: qty 1

## 2019-05-02 MED ORDER — LIDOCAINE-EPINEPHRINE 1 %-1:100000 IJ SOLN
INTRAMUSCULAR | Status: AC
  Filled 2019-05-02: qty 1

## 2019-05-02 MED ORDER — EPHEDRINE SULFATE-NACL 50-0.9 MG/10ML-% IV SOSY
PREFILLED_SYRINGE | INTRAVENOUS | Status: DC | PRN
  Administered 2019-05-02 (×3): 10 mg via INTRAVENOUS

## 2019-05-02 MED ORDER — EPHEDRINE 5 MG/ML INJ
INTRAVENOUS | Status: AC
  Filled 2019-05-02: qty 10

## 2019-05-02 MED ORDER — PROPOFOL 10 MG/ML IV BOLUS
INTRAVENOUS | Status: DC | PRN
  Administered 2019-05-02: 200 mg via INTRAVENOUS

## 2019-05-02 MED ORDER — LIDOCAINE-EPINEPHRINE 1 %-1:100000 IJ SOLN
INTRAMUSCULAR | Status: DC | PRN
  Administered 2019-05-02: 6 mL

## 2019-05-02 MED ORDER — PROMETHAZINE HCL 25 MG/ML IJ SOLN: 6.2500 mg | INTRAMUSCULAR | Status: DC | PRN

## 2019-05-02 MED ORDER — SODIUM CHLORIDE 0.9 % IV SOLN
0.0125 ug/kg/min | INTRAVENOUS | Status: AC
  Administered 2019-05-02: 0.15 ug/kg/min via INTRAVENOUS
  Filled 2019-05-02: qty 2000

## 2019-05-02 MED ORDER — SUCCINYLCHOLINE CHLORIDE 200 MG/10ML IV SOSY
PREFILLED_SYRINGE | INTRAVENOUS | Status: AC
  Filled 2019-05-02: qty 10

## 2019-05-02 MED ORDER — SODIUM CHLORIDE 0.9 % IV SOLN
INTRAVENOUS | Status: AC
  Filled 2019-05-02: qty 500000

## 2019-05-02 MED ORDER — STERILE WATER FOR IRRIGATION IR SOLN
Status: DC | PRN
  Administered 2019-05-02: 1000 mL

## 2019-05-02 MED ORDER — LIDOCAINE 2% (20 MG/ML) 5 ML SYRINGE
INTRAMUSCULAR | Status: AC
  Filled 2019-05-02: qty 5

## 2019-05-02 MED ORDER — DEXAMETHASONE SODIUM PHOSPHATE 10 MG/ML IJ SOLN
INTRAMUSCULAR | Status: DC | PRN
  Administered 2019-05-02: 5 mg via INTRAVENOUS

## 2019-05-02 MED ORDER — 0.9 % SODIUM CHLORIDE (POUR BTL) OPTIME
TOPICAL | Status: DC | PRN
  Administered 2019-05-02: 1000 mL

## 2019-05-02 MED ORDER — ONDANSETRON HCL 4 MG/2ML IJ SOLN
INTRAMUSCULAR | Status: AC
  Filled 2019-05-02: qty 2

## 2019-05-02 MED ORDER — LIDOCAINE 2% (20 MG/ML) 5 ML SYRINGE
INTRAMUSCULAR | Status: DC | PRN
  Administered 2019-05-02: 40 mg via INTRAVENOUS

## 2019-05-02 MED ORDER — MIDAZOLAM HCL 2 MG/2ML IJ SOLN
INTRAMUSCULAR | Status: AC
  Filled 2019-05-02: qty 2

## 2019-05-02 MED ORDER — LACTATED RINGERS IV SOLN
INTRAVENOUS | Status: DC | PRN
  Administered 2019-05-02 (×2): via INTRAVENOUS

## 2019-05-02 MED ORDER — CEFAZOLIN SODIUM-DEXTROSE 2-3 GM-%(50ML) IV SOLR
INTRAVENOUS | Status: DC | PRN
  Administered 2019-05-02: 2 g via INTRAVENOUS

## 2019-05-02 MED ORDER — SUCCINYLCHOLINE CHLORIDE 200 MG/10ML IV SOSY
PREFILLED_SYRINGE | INTRAVENOUS | Status: DC | PRN
  Administered 2019-05-02: 120 mg via INTRAVENOUS

## 2019-05-02 MED ORDER — ACETAMINOPHEN 500 MG PO TABS: 1000.0000 mg | ORAL_TABLET | Freq: Once | ORAL | Status: DC

## 2019-05-02 MED ORDER — ONDANSETRON HCL 4 MG/2ML IJ SOLN
INTRAMUSCULAR | Status: DC | PRN
  Administered 2019-05-02: 4 mg via INTRAVENOUS

## 2019-05-02 MED ORDER — FENTANYL CITRATE (PF) 250 MCG/5ML IJ SOLN
INTRAMUSCULAR | Status: DC | PRN
  Administered 2019-05-02: 100 ug via INTRAVENOUS

## 2019-05-02 MED ORDER — FENTANYL CITRATE (PF) 250 MCG/5ML IJ SOLN
INTRAMUSCULAR | Status: AC
  Filled 2019-05-02: qty 5

## 2019-05-02 MED ORDER — DEXAMETHASONE SODIUM PHOSPHATE 10 MG/ML IJ SOLN
INTRAMUSCULAR | Status: AC
  Filled 2019-05-02: qty 1

## 2019-05-02 MED ORDER — HYDROMORPHONE HCL 1 MG/ML IJ SOLN
INTRAMUSCULAR | Status: AC
  Filled 2019-05-02: qty 1

## 2019-05-02 MED ORDER — MIDAZOLAM HCL 2 MG/2ML IJ SOLN
INTRAMUSCULAR | Status: DC | PRN
  Administered 2019-05-02: 2 mg via INTRAVENOUS

## 2019-05-02 MED ORDER — SODIUM CHLORIDE 0.9 % IV SOLN
0.0125 ug/kg/min | INTRAVENOUS | Status: DC
  Filled 2019-05-02: qty 2000

## 2019-05-02 SURGICAL SUPPLY — 67 items
ADH SKN CLS APL DERMABOND .7 (GAUZE/BANDAGES/DRESSINGS) ×3
BAG DECANTER FOR FLEXI CONT (MISCELLANEOUS) ×2 IMPLANT
BLADE SURG 15 STRL LF DISP TIS (BLADE) ×3 IMPLANT
BLADE SURG 15 STRL SS (BLADE) ×4
CANISTER SUCT 3000ML PPV (MISCELLANEOUS) ×2 IMPLANT
CORD BIPOLAR FORCEPS 12FT (ELECTRODE) ×2 IMPLANT
COVER PROBE W GEL 5X96 (DRAPES) ×2 IMPLANT
COVER SURGICAL LIGHT HANDLE (MISCELLANEOUS) ×3 IMPLANT
COVER WAND RF STERILE (DRAPES) ×2 IMPLANT
DERMABOND ADVANCED (GAUZE/BANDAGES/DRESSINGS) ×3
DERMABOND ADVANCED .7 DNX12 (GAUZE/BANDAGES/DRESSINGS) ×2 IMPLANT
DRAPE C-ARM 35X43 STRL (DRAPES) ×1 IMPLANT
DRAPE HEAD BAR (DRAPES) ×2 IMPLANT
DRAPE INCISE IOBAN 66X45 STRL (DRAPES) ×2 IMPLANT
DRAPE MICROSCOPE LEICA 54X105 (DRAPE) ×2 IMPLANT
DRAPE UTILITY XL STRL (DRAPES) ×1 IMPLANT
DRSG TEGADERM 4X4.75 (GAUZE/BANDAGES/DRESSINGS) ×6 IMPLANT
ELECT COATED BLADE 2.86 ST (ELECTRODE) ×2 IMPLANT
ELECT EMG 18 NIMS (NEUROSURGERY SUPPLIES) ×2
ELECT REM PT RETURN 9FT ADLT (ELECTROSURGICAL) ×2
ELECTRODE EMG 18 NIMS (NEUROSURGERY SUPPLIES) ×1 IMPLANT
ELECTRODE REM PT RTRN 9FT ADLT (ELECTROSURGICAL) ×1 IMPLANT
FORCEPS BIPOLAR SPETZLER 8 1.0 (NEUROSURGERY SUPPLIES) ×2 IMPLANT
GAUZE 4X4 16PLY RFD (DISPOSABLE) ×2 IMPLANT
GAUZE SPONGE 4X4 12PLY STRL (GAUZE/BANDAGES/DRESSINGS) ×4 IMPLANT
GENERATOR PULSE INSPIRE (Generator) ×2 IMPLANT
GENERATOR PULSE INSPIRE IV (Generator) ×1 IMPLANT
GLOVE BIO SURGEON STRL SZ 6.5 (GLOVE) ×1 IMPLANT
GLOVE BIO SURGEON STRL SZ7.5 (GLOVE) ×2 IMPLANT
GOWN STRL REUS W/ TWL LRG LVL3 (GOWN DISPOSABLE) ×3 IMPLANT
GOWN STRL REUS W/TWL LRG LVL3 (GOWN DISPOSABLE) ×6
KIT BASIN OR (CUSTOM PROCEDURE TRAY) ×2 IMPLANT
KIT TURNOVER KIT B (KITS) ×2 IMPLANT
LEAD SENSING RESP INSPIRE (Lead) ×2 IMPLANT
LEAD SENSING RESP INSPIRE IV (Lead) ×1 IMPLANT
LEAD SLEEP STIM INSPIRE IV/V (Lead) ×1 IMPLANT
LEAD SLEEP STIMULATION INSPIRE (Lead) ×2 IMPLANT
LOOP VESSEL MAXI BLUE (MISCELLANEOUS) ×2 IMPLANT
LOOP VESSEL MINI RED (MISCELLANEOUS) ×1 IMPLANT
MARKER SKIN DUAL TIP RULER LAB (MISCELLANEOUS) ×4 IMPLANT
NDL HYPO 25GX1X1/2 BEV (NEEDLE) ×1 IMPLANT
NEEDLE HYPO 25GX1X1/2 BEV (NEEDLE) ×2 IMPLANT
NS IRRIG 1000ML POUR BTL (IV SOLUTION) ×2 IMPLANT
PAD ARMBOARD 7.5X6 YLW CONV (MISCELLANEOUS) ×2 IMPLANT
PASSER CATH 38CM DISP (INSTRUMENTS) ×2 IMPLANT
PENCIL BUTTON HOLSTER BLD 10FT (ELECTRODE) ×2 IMPLANT
POSITIONER HEAD DONUT 9IN (MISCELLANEOUS) ×2 IMPLANT
PROBE NERVE STIMULATOR (NEUROSURGERY SUPPLIES) ×2 IMPLANT
REMOTE CONTROL SLEEP INSPIRE (MISCELLANEOUS) ×2 IMPLANT
SET WALTER ACTIVATION W/DRAPE (SET/KITS/TRAYS/PACK) ×2 IMPLANT
SLING ARM FOAM STRAP LRG (SOFTGOODS) ×1 IMPLANT
SPONGE INTESTINAL PEANUT (DISPOSABLE) ×2 IMPLANT
STAPLER VISISTAT 35W (STAPLE) ×2 IMPLANT
SUT SILK 2 0 SH (SUTURE) ×3 IMPLANT
SUT SILK 3 0 REEL (SUTURE) ×2 IMPLANT
SUT SILK 3 0 SH 30 (SUTURE) ×4 IMPLANT
SUT SILK 3-0 (SUTURE) ×2
SUT SILK 3-0 RB1 30XBRD (SUTURE) ×1
SUT VIC AB 3-0 SH 27 (SUTURE) ×10
SUT VIC AB 3-0 SH 27X BRD (SUTURE) ×1 IMPLANT
SUT VIC AB 4-0 PS2 27 (SUTURE) ×5 IMPLANT
SUTURE SILK 3-0 RB1 30XBRD (SUTURE) ×1 IMPLANT
SYR 10ML LL (SYRINGE) ×2 IMPLANT
TAPE CLOTH SURG 6X10 WHT LF (GAUZE/BANDAGES/DRESSINGS) ×1 IMPLANT
TOWEL GREEN STERILE (TOWEL DISPOSABLE) ×3 IMPLANT
TRAY ENT MC OR (CUSTOM PROCEDURE TRAY) ×2 IMPLANT
WATER STERILE IRR 1000ML POUR (IV SOLUTION) ×1 IMPLANT

## 2019-05-02 NOTE — Anesthesia Postprocedure Evaluation (Signed)
Anesthesia Post Note  Patient: Craig [name]  Procedure(s) Performed: IMPLANTATION OF HYPOGLOSSAL NERVE STIMULATOR (Right Chest)     Patient location during evaluation: PACU Anesthesia Type: General Level of consciousness: awake and alert, oriented and patient cooperative Pain management: pain level controlled Vital Signs Assessment: post-procedure vital signs reviewed and stable Respiratory status: spontaneous breathing, nonlabored ventilation and respiratory function stable Cardiovascular status: blood pressure returned to baseline and stable Postop Assessment: no apparent nausea or vomiting Anesthetic complications: no    Last Vitals:  Vitals:   05/02/19 0647 05/02/19 1138  BP: 125/73 (!) 146/77  Pulse: 72 93  Resp: 20 16  Temp: 36.6 C (!) 36.4 C  SpO2: 97% 92%    Last Pain:  Vitals:   05/02/19 1138  TempSrc:   PainSc: [place]

## 2019-05-02 NOTE — Anesthesia Procedure Notes (Signed)
Procedure Name: Intubation Date/Time: 05/02/2019 8:51 AM Performed by: Kathryne Hitch, CRNA Pre-anesthesia Checklist: Patient identified, Emergency Drugs available, Suction available and Patient being monitored Patient Re-evaluated:Patient Re-evaluated prior to induction Oxygen Delivery Method: Circle system utilized Preoxygenation: Pre-oxygenation with 100% oxygen Induction Type: IV induction Ventilation: Mask ventilation with difficulty, Two handed mask ventilation required and Oral airway inserted - appropriate to patient size Laryngoscope Size: Mac and 4 Grade View: Grade II Tube type: Oral Number of attempts: 2 Airway Equipment and Method: Stylet and Oral airway Placement Confirmation: ETT inserted through vocal cords under direct vision,  positive ETCO2 and breath sounds checked- equal and bilateral Tube secured with: Tape Dental Injury: Teeth and Oropharynx as per pre-operative assessment  Future Recommendations: Recommend- induction with short-acting agent, and alternative techniques readily available Comments: DLx1 miller 3 grade 3 view, DL#2 Mac 4 grade 2b-3 view ETT passed successfully

## 2019-05-02 NOTE — Op Note (Signed)
NAME: Craig [name], Craig [name] MEDICAL RECORD [id number] ACCOUNT [id number] DATE OF BIRTH:Oct 02, 1965 FACILITY: [facility] LOCATION: [facility] PHYSICIAN:Araceli Coufal Guido Sander, MD  OPERATIVE REPORT  DATE OF PROCEDURE:  05/02/2019  PREOPERATIVE DIAGNOSIS:  Obstructive sleep apnea.  POSTOPERATIVE DIAGNOSIS:  Obstructive sleep apnea.  PROCEDURE:  Placement of hypoglossal nerve stimulator, including sensor lead and testing of device.  SURGEON:  [name], MD.  ASSISTANTJolene Provost, PA, who was necessary to assist in retraction and closure.  ANESTHESIA:  General endotracheal anesthesia.  COMPLICATIONS:  None.  INDICATIONS:  The patient is a 53 year old male with a history of obstructive sleep apnea who has had difficulty tolerating CPAP.  He presents to the operating room for placement of hypoglossal nerve stimulator.  FINDINGS:  Surgical findings were normal.  The device was tested intraoperatively and found to have excellent stimulation of the hypoglossal nerve as well as sensation of the breathing pattern.  DESCRIPTION OF PROCEDURE:  The patient was identified in the holding room.  Informed consent having been obtained, including discussion of risks, benefits and alternatives, the patient was brought to the operative suite and placed on the table in the  supine position.  Anesthesia was induced and the patient was intubated by the anesthesia team without difficulty.  The patient was given intravenous antibiotics during the case.  The eyes were taped closed and the bed was turned 180 degrees from  anesthesia.  The nerve integrity monitor was placed in the right lateral tongue and floor of mouth in the standard fashion and turned on for the case.  Incisions were marked with a marking pen and injected with 1% lidocaine with 1:100,000 epinephrine.   The right neck and chest were prepped and draped in sterile fashion.  The neck incision was made with a 15 blade scalpel through the skin and extended  through the subcutaneous layer using Bovie electrocautery.  The platysma muscle was identified and  dissection was extended inferiorly along the platysma muscle where it was then divided using Bovie electrocautery.  Dissection was then performed directly down onto the inferior extent of the submandibular gland and the gland was dissected around its  anterior margin as well, allowing it to be mobilized superiorly and posteriorly, exposing the digastric tendon.  The tendon was elevated and then retracted inferiorly using 2 vessel loops.  The mylohyoid muscle was then dissected and able to be retracted  anteriorly, exposing the hypoglossal nerve.  The nerve was cleared of fascial connections and veins were using bipolar electrocautery and sharp dissection.  The various branches of the nerve were then identified and the nerve stimulator was used to  identify inclusion and exclusion branches.  The break point between those two was identified and a pocket was then created around the nerve, including C1 to allow placement of the cuff.  The cuff was then brought into view and placed around the nerve  with only inclusion branches included.  It was positioned properly and then saline was irrigated under the cuff.  The anchor was then sutured to the digastric tendon using 3-0 silk suture in 2 positions.  The lead was then placed into the wound and  covered with damp gauze.  The incision was then made below the clavicle using a 15 blade scalpel and extended through the subcutaneous tissues using Bovie electrocautery.  A pocket was then created over the pectoralis fascia.  Two stay sutures were then  placed at the superior extent of the wound using 2-0 silk suture.  The lateral chest  incision was made with a 15 blade scalpel and extended through subcutaneous tissues using Bovie electrocautery down to the serratus muscle.  The serratus was then  bluntly dissected, exposing the external intercostal muscle.  This was also  bluntly dissected, exposing the internal intercostal muscle and a malleable  was then placed into the pocket between those 2 muscles running anteriorly in the rib space.  The  sense lead was then placed into this pocket as the malleable was pulled out and the sense lead was properly positioned.  The anchor was then sutured in 3 positions using 3-0 silk suture.  The tunneling device was then used to make a tunnel from the  generator pocket down to the lateral chest wall pocket and the sense lead then pulled up into the generator pocket.  The second anchor was then sutured in place for the sense lead using 3-0 silk suture in 2 locations.  The neck incision was then examined  and the tunnel for the stimulating lead started with blunt dissection with hemostat and then tunneled with the tunneler to the generator pocket, then the stimulator lead was brought into the pocket as well.  Each lead was then cleaned off with damp  gauze and then dried and placed into the generator in the proper position and each of the screws was tightened down to 2 clicks.  The generator was then positioned into the generator pocket properly and then was tested.  This resulted in excellent  stimulation of the hypoglossal nerve with protrusion of the tongue.  Initially, the sense lead did not seem to be giving the proper signaling, so the distal anchor was released by removing sutures and the sense lead pulled out.  The pocket was then  redissected and the malleable replaced and the sense lead reposition.  The malleable was removed.  At this point, the device was again tested and had a much better sense lead signal.  The anchor was resutured using 3-0 silk suture in the same fashion.   At this point, the device was turned off.  Each of the wounds was copiously irrigated with saline.  The platysma muscle in the neck and the deep tissues in the 2 chest incisions were closed with 3-0 Vicryl suture in a simple interrupted fashion.  The   subcutaneous layer in all 3 incisions was closed with 4-0 Vicryl suture in a simple interrupted fashion.  The patient was then cleaned off and each incision closed further with Dermabond.  Drapes were removed and pressure dressings were placed on each of  the incisions.  He was then returned to anesthesia for wakeup and was extubated and taken to the recovery room in stable condition.  VN/NUANCE  D:05/02/2019 T:05/02/2019 JOB:[id number]

## 2019-05-02 NOTE — H&P (Signed)
Craig [name] is an 53 y.o. male.   Chief Complaint: Sleep apnea HPI: 53 year old male with obstructive sleep apnea who presents for hypoglossal nerve stimulator placement.  Past Medical History:  Diagnosis Date  . 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 GERD (gastroesophageal reflux disease)   . 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 Hyperlipidemia   . Hypertension   . Hypogonadism male   . Lumbar spinal stenosis 07/31/2014  . Pre-diabetes   . PTSD (post-traumatic stress disorder)   . PTSD (post-traumatic stress disorder)   . Sleep apnea    pt does not use CPAP  . Tinnitus   . Vertigo     Past Surgical History:  Procedure Laterality Date  . BUNIONECTOMY Bilateral   . CATARACT EXTRACTION Right    2016  . CATARACT EXTRACTION W/ INTRAOCULAR LENS IMPLANT Left 2014  . DRUG INDUCED ENDOSCOPY N/A 03/02/2019   Procedure: DRUG INDUCED ENDOSCOPY;  Surgeon: [name], MD;  Location: [facility];  Service: ENT;  Laterality: N/A;  . EYE SURGERY    . JOINT REPLACEMENT    . LUMBAR LAMINECTOMY/DECOMPRESSION MICRODISCECTOMY Left 07/31/2014   Procedure: LUMBAR LAMINECTOMY/DECOMPRESSION MICRODISCECTOMY 1 LEVEL L5-S1 ON LEFT;  Surgeon: Susa Day, MD;  Location: [facility];  Service: Orthopedics;  Laterality: Left;  . PARTIAL NEPHRECTOMY Left 2008  . TOTAL HIP ARTHROPLASTY Right 2007    Family History  Problem Relation Age of Onset  . Breast cancer Mother   . Hypertension Maternal Grandmother   . Breast cancer Paternal Grandmother    Social History:  reports that he has never smoked. He has never used smokeless tobacco. He reports that he does not drink alcohol or use drugs.  Allergies:  Allergies  Allergen Reactions  . Ziac [Bisoprolol-Hydrochlorothiazide]     Erectile dysfunction    Medications Prior to Admission  Medication Sig Dispense Refill  . carboxymethylcellulose (REFRESH TEARS)  0.5 % SOLN Place 1 drop into both eyes 3 (three) times daily as needed (dry eyes).    . cetirizine (ZYRTEC) 10 MG tablet Take 1 tablet (10 mg total) by mouth daily. (Patient taking differently: Take 10 mg by mouth See admin instructions. Take 10 mg daily for 1 week then take loratadine 10 mg daily for 1 week, alternating weeks) 90 tablet 4  . Cholecalciferol (VITAMIN D3) 50 MCG (2000 UT) capsule Take 2,000 Units by mouth daily.    Marland Kitchen CINNAMON PO Take 1,200 mg by mouth daily.    . lansoprazole (PREVACID) 15 MG capsule Take 15 mg by mouth daily.    Marland Kitchen lisinopril-hydrochlorothiazide (PRINZIDE,ZESTORETIC) 20-12.5 MG tablet Take 1 tablet by mouth daily.    Marland Kitchen loratadine (CLARITIN) 10 MG tablet Take 10 mg by mouth See admin instructions. Take 10 mg daily for 1 week then take cetirizine 10 mg daily for 1 week, alternating weeks    . Maca Root 500 MG CAPS Take 500 mg by mouth daily.    . magnesium oxide (MAG-OX) 400 MG tablet Take 400 mg by mouth daily.    . nortriptyline (PAMELOR) 10 MG capsule Take 10 mg by mouth daily as needed (migraines).    . Omega-3 Fatty Acids (OMEGA 3 PO) Take 2,000 mg by mouth daily.    . tadalafil (CIALIS) 5 MG tablet Take 5 mg by mouth daily.    . Thiamine HCl (  VITAMIN B-1) 250 MG tablet Take 250 mg by mouth daily.    . vitamin C (ASCORBIC ACID) 500 MG tablet Take 500 mg by mouth daily.    . Zinc 50 MG TABS Take 50 mg by mouth daily.     . meloxicam (MOBIC) 15 MG tablet Take 1 tablet (15 mg total) by mouth daily. (Patient not taking: Reported on 04/24/2019) 30 tablet 1    Results for orders placed or performed during the hospital encounter of 04/30/19 (from the past 48 hour(s))  SARS CORONAVIRUS 2 (TAT 6-24 HRS) Nasopharyngeal Nasopharyngeal Swab     Status: None   Collection Time: 04/30/19  9:50 AM   Specimen: Nasopharyngeal Swab  Result Value Ref Range   SARS Coronavirus 2 NEGATIVE NEGATIVE    Comment: (NOTE) SARS-CoV-2 target nucleic acids are NOT DETECTED. The  SARS-CoV-2 RNA is generally detectable in upper and lower respiratory specimens during the acute phase of infection. Negative results do not preclude SARS-CoV-2 infection, do not rule out co-infections with other pathogens, and should not be used as the sole basis for treatment or other patient management decisions. Negative results must be combined with clinical observations, patient history, and epidemiological information. The expected result is Negative. Fact Sheet for Patients: SugarRoll.be Fact Sheet for Healthcare Providers: https://www.woods-mathews.com/ This test is not yet approved or cleared by the [place] FDA and  has been authorized for detection and/or diagnosis of SARS-CoV-2 by FDA under an Emergency Use Authorization (EUA). This EUA will remain  in effect (meaning this test can be used) for the duration of the COVID-19 declaration under Section 56 4(b)(1) of the Act, 21 U.S.C. section 360bbb-3(b)(1), unless the authorization is terminated or revoked sooner. Performed at [facility], [place] [address], [place], [place] [address]    No results found.  Review of Systems  All other systems reviewed and are negative.   Blood pressure 125/73, pulse 72, temperature 97.9 F (36.6 C), temperature source Oral, resp. rate 20, height 6\' 1"  (1.854 m), weight 117.9 kg, SpO2 97 %. Physical Exam  Constitutional: He is oriented to person, place, and time. He appears well-developed and well-nourished. No distress.  HENT:  Head: Normocephalic and atraumatic.  Right Ear: External ear normal.  Left Ear: External ear normal.  Nose: Nose normal.  Mouth/Throat: Oropharynx is clear and moist.  Eyes: Pupils are equal, round, and reactive to light. Conjunctivae and EOM are normal.  Neck: Normal range of motion. Neck supple.  Cardiovascular: Normal rate.  Respiratory: Effort normal.  Neurological: He is alert and oriented to person,  place, and time. No cranial nerve deficit.  Skin: Skin is warm and dry.  Psychiatric: He has a normal mood and affect. His behavior is normal. Judgment and thought content normal.     Assessment/Plan Obstructive sleep apnea  To OR for hypoglossal nerve stimulator placement.  [name], MD 05/02/2019, 8:17 AM

## 2019-05-02 NOTE — Transfer of Care (Signed)
Immediate Anesthesia Transfer of Care Note  Patient: Craig [name]  Procedure(s) Performed: IMPLANTATION OF HYPOGLOSSAL NERVE STIMULATOR (Right Chest)  Patient Location: PACU  Anesthesia Type:General  Level of Consciousness: drowsy and patient cooperative  Airway & Oxygen Therapy: Patient Spontanous Breathing and Patient connected to nasal cannula oxygen  Post-op Assessment: Report given to RN and Post -op Vital signs reviewed and stable  Post vital signs: Reviewed and stable  Last Vitals:  Vitals Value Taken Time  BP 146/77 05/02/19 1138  Temp 36.4 C 05/02/19 1138  Pulse 88 05/02/19 1151  Resp 14 05/02/19 1151  SpO2 91 % 05/02/19 1151  Vitals shown include unvalidated device data.  Last Pain:  Vitals:   05/02/19 1138  TempSrc:   PainSc: Asleep         Complications: No apparent anesthesia complications

## 2019-05-02 NOTE — Brief Op Note (Signed)
05/02/2019  11:13 AM  PATIENT:  Craig [name]  53 y.o. male  PRE-OPERATIVE DIAGNOSIS:  obstructive sleep apnea  POST-OPERATIVE DIAGNOSIS:  obstructive sleep apnea  PROCEDURE:  Procedure(s) with comments: IMPLANTATION OF HYPOGLOSSAL NERVE STIMULATOR (Right) - Neck, Chest, Lower Lateral Chest  SURGEON:  Surgeon(s) and Role:    [name], MD - Primary  PHYSICIAN ASSISTANT: Sallee Provencal  ASSISTANTS: none   ANESTHESIA:   general  EBL:  25 mL   BLOOD ADMINISTERED:none  DRAINS: none   LOCAL MEDICATIONS USED:  LIDOCAINE   SPECIMEN:  No Specimen  DISPOSITION OF SPECIMEN:  N/A  COUNTS:  YES  TOURNIQUET:  * No tourniquets in log *  DICTATION: .Other Dictation: Dictation Number [id number]  PLAN OF CARE: Discharge to home after PACU  PATIENT DISPOSITION:  PACU - hemodynamically stable.   Delay start of Pharmacological VTE agent (>24hrs) due to surgical blood loss or risk of bleeding: yes

## 2019-05-15 ENCOUNTER — Other Ambulatory Visit: Payer: Self-pay

## 2019-05-15 ENCOUNTER — Ambulatory Visit: Payer: 59 | Admitting: Adult Health

## 2019-05-15 ENCOUNTER — Encounter: Payer: Self-pay | Admitting: Adult Health

## 2019-05-15 VITALS — BP 118/78 | HR 75 | Temp 97.5°F | Ht 73.0 in | Wt 260.0 lb

## 2019-05-15 DIAGNOSIS — Z1329 Encounter for screening for other suspected endocrine disorder: Secondary | ICD-10-CM

## 2019-05-15 DIAGNOSIS — Z0000 Encounter for general adult medical examination without abnormal findings: Secondary | ICD-10-CM | POA: Diagnosis not present

## 2019-05-15 DIAGNOSIS — E349 Endocrine disorder, unspecified: Secondary | ICD-10-CM

## 2019-05-15 DIAGNOSIS — E782 Mixed hyperlipidemia: Secondary | ICD-10-CM

## 2019-05-15 DIAGNOSIS — Z0001 Encounter for general adult medical examination with abnormal findings: Secondary | ICD-10-CM

## 2019-05-15 DIAGNOSIS — R7303 Prediabetes: Secondary | ICD-10-CM

## 2019-05-15 DIAGNOSIS — G4733 Obstructive sleep apnea (adult) (pediatric): Secondary | ICD-10-CM

## 2019-05-15 DIAGNOSIS — Z131 Encounter for screening for diabetes mellitus: Secondary | ICD-10-CM

## 2019-05-15 DIAGNOSIS — K2100 Gastro-esophageal reflux disease with esophagitis, without bleeding: Secondary | ICD-10-CM

## 2019-05-15 DIAGNOSIS — I10 Essential (primary) hypertension: Secondary | ICD-10-CM

## 2019-05-15 DIAGNOSIS — M26609 Unspecified temporomandibular joint disorder, unspecified side: Secondary | ICD-10-CM

## 2019-05-15 DIAGNOSIS — Z136 Encounter for screening for cardiovascular disorders: Secondary | ICD-10-CM

## 2019-05-15 DIAGNOSIS — E669 Obesity, unspecified: Secondary | ICD-10-CM

## 2019-05-15 DIAGNOSIS — Z85528 Personal history of other malignant neoplasm of kidney: Secondary | ICD-10-CM

## 2019-05-15 DIAGNOSIS — E559 Vitamin D deficiency, unspecified: Secondary | ICD-10-CM

## 2019-05-15 DIAGNOSIS — Z125 Encounter for screening for malignant neoplasm of prostate: Secondary | ICD-10-CM

## 2019-05-15 DIAGNOSIS — N400 Enlarged prostate without lower urinary tract symptoms: Secondary | ICD-10-CM

## 2019-05-15 DIAGNOSIS — H8109 Meniere's disease, unspecified ear: Secondary | ICD-10-CM

## 2019-05-15 DIAGNOSIS — Z79899 Other long term (current) drug therapy: Secondary | ICD-10-CM

## 2019-05-15 DIAGNOSIS — Z113 Encounter for screening for infections with a predominantly sexual mode of transmission: Secondary | ICD-10-CM

## 2019-05-15 MED ORDER — TADALAFIL 5 MG PO TABS: 5.0000 mg | ORAL_TABLET | Freq: Every day | ORAL | 5 refills | Status: DC

## 2019-05-15 NOTE — Progress Notes (Signed)
Complete Physical  Assessment and Plan:  Diagnoses and all orders for this visit:  Routine general medical examination at a health care facility Getting shingrix via [place]; UTD on health maintenance Weight loss and increased water intake encouraged  Essential hypertension Continue medication Monitor blood pressure at home; call if consistently over 130/80 Continue DASH diet.   Reminder to go to the ER if any CP, SOB, nausea, dizziness, severe HA, changes vision/speech, left arm numbness and tingling and jaw pain.\  OSA (obstructive sleep apnea) CPAP poorly tolerant;  Recent hypoglossal nerve stimulator implantation, will initiate in Jan 2021 - followed by Dr. Redmond Baseman Weight loss encouraged  GERD Well managed on current medications -OTC PRN Discussed diet, avoiding triggers and other lifestyle changes  Meniere's disease, unspecified laterality Suspected diagnosis by [facility]aud[facility] after episodes of decreased hearing, vertigo and syncope. Reportedly currently treated by lifestyle modification; surgery was discussed but declined at this time. No further episodes of syncope.   Hyperlipidemia Currently at goal with lifestyle modification -     Lipid panel -     TSH  Prediabetes Reviewed diet; recommended stop soda/processed carb intake Discussed disease and risks Discussed diet/exercise, weight management  -     Hemoglobin A1c - defer as just had 04/30/2019 - 6.3 - 6 month follow up  Vitamin D deficiency Continue supplementation  -     VITAMIN D 25 Hydroxy (Vit-D Deficiency, Fractures)  Medication management -     CBC with Differential/Platelet -     CMP/GFR -     Magnesium   Obesity (BMI 30.0-34.9) Long discussion about weight loss, diet, and exercise Recommended diet heavy in fruits and veggies and low in animal meats, cheeses, and dairy products, appropriate calorie intake Discussed appropriate weight for height  Follow up at next visit  History of L kidney  cancer S/p partial nephrectomy, now monitored annually by Dr. [name]  BPH without symptoms Per urology notes; denies symptoms; monitor PSA annually, will forward to Dr. [name]  Erectile dysfunction Doing well with daily cialis; prescription refilled; follow up urology  Discussed med's effects and SE's. Screening labs and tests as requested with regular follow-up as recommended. Over 40 minutes of exam, counseling, chart review and critical decision making was performed  Future Appointments  Date Time Provider [place]  05/14/2020  2:00 PM [name], NP [facility] None     HPI 53 y.o. AA male - . Patient presents for a complete physical. has Essential hypertension; Hyperlipidemia; Prediabetes; Vitamin D deficiency; OSA (obstructive sleep apnea); Medication management; GERD; TMJ (temporomandibular joint syndrome); Obesity (BMI 30.0-34.9); Meniere disease; Testosterone deficiency; BPH (benign prostatic hyperplasia); and History of kidney cancer on their problem list.   retired early after working for [employer], divorced with two grown children.  New girlfriend, 9 months, using condoms, does request STD testing today.   He is [facility] for frequent headaches; [facility] which [facility]. Does chiropractor as needed. Takes amitriptyline 10 mg PRN, typically twice a month.   He is diagnosed with Meniere's disease and is followed as needed by [facility]- currently managed by hydrochlorothiazide, allergy medication, lifestyle.   He has hx of OSA previously with CPAP; struggled due to claustrophobia, felt worse with machine, had a repeat sleep study scheduled through the [place]. Recently on 05/02/2019 underwent hypoglossal nerve stimulator placement by ENT Dr. [name], reports is recovering well from procedure, will be initiating nerve stimulator 1 month  from procedure.  He has GERD well controlled by medication.    BMI is  Body mass index is 34.3 kg/m., he has been working on diet and exercise- he reports he walks 5 miles a day as weather allows which also helps his back stiffness significantly, but hasn't done this since surgery per instructions, will restart next month.  No ETOH. Occasional soda (1-2 cans per day, pepsi) Estimates 40 fluid ounces of water daily  Wt Readings from Last 3 Encounters:  05/15/19 260 lb (117.9 kg)  05/02/19 260 lb (117.9 kg)  04/30/19 264 lb 2 oz (119.8 kg)   He has BP cuff but hasn't been checking as recently stable/well controlled, today their BP is BP: 118/78 He does workout. He denies chest pain, shortness of breath, dizziness.   He is not on cholesterol medication and denies myalgias. His cholesterol is not at goal. The cholesterol last visit was:   Lab Results  Component Value Date   CHOL 165 01/15/2019   HDL 39 (L) 01/15/2019   LDLCALC 105 (H) 01/15/2019   TRIG 110 01/15/2019   CHOLHDL 4.2 01/15/2019   He has been working on diet and exercise for prediabetes, he is not on bASA, he is on ACE/ARB and denies increased appetite, nausea, paresthesia of the feet, polydipsia, polyuria, visual disturbances and vomiting. 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 [name] 07/21/2018, 1 year follow up, PSA annually at CPE - forward to Dr. [name] He endorses some erectile dysfunction for which he uses 20 mg cialis; he has BPH  Last PSA was: Lab Results  Component Value Date   PSA 1.3 05/10/2018     Current Medications:  Current Outpatient Medications on File Prior to Visit  Medication Sig Dispense Refill   . carboxymethylcellulose (REFRESH TEARS) 0.5 % SOLN Place 1 drop into both eyes 3 (three) times daily as needed (dry eyes).    . cetirizine (ZYRTEC) 10 MG tablet Take 1 tablet (10 mg total) by mouth daily. (Patient taking differently: Take 10 mg by mouth See admin instructions. Take 10 mg daily for 1 week then take loratadine 10 mg daily for 1 week, alternating weeks) 90 tablet 4  . Cholecalciferol (VITAMIN D3) 50 MCG (2000 UT) capsule Take 2,000 Units by mouth daily.    Marland Kitchen CINNAMON PO Take 1,200 mg by mouth daily.    . lansoprazole (PREVACID) 15 MG capsule Take 15 mg by mouth daily.    Marland Kitchen lisinopril-hydrochlorothiazide (PRINZIDE,ZESTORETIC) 20-12.5 MG tablet Take 1 tablet by mouth daily.    . Maca Root 500 MG CAPS Take 500 mg by mouth daily.    . magnesium oxide (MAG-OX) 400 MG tablet Take 400 mg by mouth daily.    . nortriptyline (PAMELOR) 10 MG capsule Take 10 mg by mouth daily as needed (migraines).    . Omega-3 Fatty Acids (OMEGA 3 PO) Take 2,000 mg by mouth daily.    Marland Kitchen OVER THE COUNTER MEDICATION Takes Beet Root, 100mg  daily    . OVER THE COUNTER MEDICATION Takes  Argannine and Citrulline, 500/250    . tadalafil (CIALIS) 5 MG tablet Take 5 mg by mouth daily.    . Thiamine HCl (VITAMIN B-1) 250 MG tablet Take 250 mg by mouth daily.    . vitamin C (ASCORBIC ACID) 500 MG tablet Take 500 mg by mouth daily.    . Zinc 50 MG TABS Take 50 mg by mouth daily.     Marland Kitchen loratadine (CLARITIN) 10 MG tablet Take 10 mg by mouth See admin instructions. Take 10 mg daily for 1 week then take cetirizine 10 mg daily for 1 week, alternating weeks     No current facility-administered medications on file prior to visit.   Allergies:  Allergies  Allergen Reactions  . Ziac [Bisoprolol-Hydrochlorothiazide]     Erectile dysfunction   Health Maintenance:  Immunization History  Administered Date(s) Administered  . PPD Test 12/18/2013, 02/11/2015  . Td 05/24/2010   Tetanus:2012 Pneumovax: n/a Prevnar 13:  n/a Flu vaccine: 03/2019 Shingrix: 1/2 in 03/2019, has next scheduled Jan 2021  DEXA: n/a Colonoscopy: 2018, due 2023 EGD: n/a  Eye Exam: Dr. Lucita Ferrara, hx of cataracts, now going to my eye care at [place], last 2020  Dentist: Dr. Doristine Church, last 2020, q71m  Patient Care Team: Unk Pinto, MD as PCP - General (Internal Medicine)  Medical History:  has Essential hypertension; Hyperlipidemia; Prediabetes; Vitamin D deficiency; OSA (obstructive sleep apnea); Medication management; GERD; TMJ (temporomandibular joint syndrome); Obesity (BMI 30.0-34.9); Meniere disease; Testosterone deficiency; BPH (benign prostatic hyperplasia); and History of kidney cancer on their problem list. Surgical History:  He  has a past surgical history that includes Total hip arthroplasty (Right, 2007); Cataract extraction w/ intraocular lens implant (Left, 2014); Bunionectomy (Bilateral); Lumbar laminectomy/decompression microdiscectomy (Left, 07/31/2014); Cataract extraction (Right); Partial nephrectomy (Left, 2008); Drug induced endoscopy (N/A, 03/02/2019); Eye surgery; and Implantation of hypoglossal nerve stimulator (Right, 05/02/2019). Family History:  His family history includes Breast cancer in his mother and paternal grandmother; Hypertension in his maternal grandmother. Social History:   reports that he has never smoked. He has never used smokeless tobacco. He reports that he does not drink alcohol or use drugs. Review of Systems:  Review of Systems  Constitutional: Negative for malaise/fatigue and weight loss.  HENT: Negative for ear discharge, ear pain, hearing loss and tinnitus.   Eyes: Negative for blurred vision and double vision.  Respiratory: Negative for cough, shortness of breath and wheezing.   Cardiovascular: Negative for chest pain, palpitations, orthopnea, claudication and leg swelling.  Gastrointestinal: Negative for abdominal pain, blood in stool, constipation, diarrhea, heartburn,  melena, nausea and vomiting.  Genitourinary: Negative.   Musculoskeletal: Negative for back pain, falls, joint pain and myalgias.  Skin: Negative for rash.  Neurological: Positive for dizziness (Intermittent; vertigo related to Meniere's). Negative for tingling, sensory change, weakness and headaches.  Endo/Heme/Allergies: Negative for polydipsia.  Psychiatric/Behavioral: Negative.   All other systems reviewed and are negative.   Physical Exam: Estimated body mass index is 34.3 kg/m as calculated from the following:   Height as of this encounter: 6\' 1"  (1.854 m).   Weight as of this encounter: 260 lb (117.9 kg). BP 118/78   Pulse 75   Temp (!) 97.5 F (36.4 C)   Ht 6\' 1"  (1.854 m)   Wt 260 lb (117.9 kg)   SpO2 97%   BMI 34.30 kg/m  General Appearance: Well nourished, in no apparent distress.  Eyes: PERRLA, EOMs, conjunctiva no swelling or erythema, normal fundi and vessels.  Sinuses: No  Frontal/maxillary tenderness  ENT/Mouth: Ext aud canals clear, normal light reflex with TMs without erythema, bulging. Good dentition. No erythema, swelling, or exudate on post pharynx. Tonsils not swollen or erythematous. Hearing normal.  Neck: Supple, thyroid normal. No bruits. Hypoglossal stimulator palpable in right neck over anterior cervical chain region. Non-tender.  Respiratory: Respiratory effort normal, BS equal bilaterally without rales, rhonchi, wheezing or stridor.  Cardio: RRR without murmurs, rubs or gallops. Brisk peripheral pulses without edema.  Chest: symmetric, with normal excursions and percussion.  Abdomen: Soft, nontender, no guarding, rebound, hernias, masses, or organomegaly.  Lymphatics: Non tender without lymphadenopathy.  Genitourinary: Defer to urology Musculoskeletal: Full ROM all peripheral extremities,5/5 strength, and normal gait.  Skin: Warm, dry without rashes, lesions, ecchymosis. Well healed surgical scars to right lower/lateral chest, upper chest, right  submandibular.  Neuro: Cranial nerves intact, reflexes equal bilaterally. Normal muscle tone, no cerebellar symptoms. Sensation intact.  Psych: Awake and oriented X 3, normal affect, Insight and Judgment appropriate.   EKG: WNL no changes reviewed from 04/30/2019  Craig [name] 3:26 PM [facility]

## 2019-05-15 NOTE — Patient Instructions (Addendum)
Craig [name] , Thank you for taking time to come for your Annual Wellness Visit. I appreciate your ongoing commitment to your health goals. Please review the following plan we discussed and let me know if I can assist you in the future.   These are the goals we discussed: Goals    . DIET - INCREASE WATER INTAKE     65-80+    . DIET - REDUCE SUGAR INTAKE     Recommend quit sodas, limit to occasional intake     . HEMOGLOBIN A1C < 5.7    . Weight (lb) < 245 lb (111.1 kg)       This is a list of the screening recommended for you and due dates:  Health Maintenance  Topic Date Due  . Tetanus Vaccine  05/24/2020  . Colon Cancer Screening  12/08/2021  . Flu Shot  Completed  . HIV Screening  Completed     Know what a healthy weight is for you (roughly BMI <25) and aim to maintain this  Aim for 7+ servings of fruits and vegetables daily  65-80+ fluid ounces of water or unsweet tea for healthy kidneys  Limit to max 1 drink of alcohol per day; avoid smoking/tobacco  Limit animal fats in diet for cholesterol and heart health - choose grass fed whenever available  Avoid highly processed foods, and foods high in saturated/trans fats  Aim for low stress - take time to unwind and care for your mental health  Aim for 150 min of moderate intensity exercise weekly for heart health, and weights twice weekly for bone health  Aim for 7-9 hours of sleep daily   Recommend cutting down on sugar intake  - the American Heart Association recommends no more than 9 teaspoons (38 g) of added sugar for men daily, and 6 teaspoons (25 g) for women. Added sugar can be in many things that you might not expect - salad dressings, bread that is not home made, "all natural" fruit juice, etc., most processed foods contain hidden sugars. Consider looking at labels and being aware of how much sugar you are consuming in a day. Less is always better for sugar; sugar reduces your body's immune response, and damages  blood vessels, leading to increased risk of many diseases.      Drink 1/2 your body weight in fluid ounces of water daily; drink a tall glass of water 30 min before meals  Don't eat until you're stuffed- listen to your stomach and eat until you are 80% full   Try eating off of a salad plate; wait 10 min after finishing before going back for seconds  Start by eating the vegetables on your plate; aim for 50% of your meals to be fruits or vegetables  Then eat your protein - lean meats (grass fed if possible), fish, beans, nuts in moderation  Eat your carbs/starch last ONLY if you still are hungry. If you can, stop before finishing it all  Avoid sugar and flour - the closer it looks to it's original form in nature, typically the better it is for you  Splurge in moderation - "assign" days when you get to splurge and have the "bad stuff" - I like to follow a 80% - 20% plan- "good" choices 80 % of the time, "bad" choices in moderation 20% of the time  Simple equation is: Calories out > calories in = weight loss - even if you eat the bad stuff, if you limit portions, you will  still lose weight       Preventing Type 2 Diabetes Mellitus Type 2 diabetes (type 2 diabetes mellitus) is a long-term (chronic) disease that affects blood sugar (glucose) levels. Normally, a hormone called insulin allows glucose to enter cells in the body. The cells use glucose for energy. In type 2 diabetes, one or both of these problems may be present:  The body does not make enough insulin.  The body does not respond properly to insulin that it makes (insulin resistance). Insulin resistance or lack of insulin causes excess glucose to build up in the blood instead of going into cells. As a result, high blood glucose (hyperglycemia) develops, which can cause many complications. Being overweight or obese and having an inactive (sedentary) lifestyle can increase your risk for diabetes. Type 2 diabetes can be delayed or  prevented by making certain nutrition and lifestyle changes. What nutrition changes can be made?   Eat healthy meals and snacks regularly. Keep a healthy snack with you for when you get hungry between meals, such as fruit or a handful of nuts.  Eat lean meats and proteins that are low in saturated fats, such as chicken, fish, egg whites, and beans. Avoid processed meats.  Eat plenty of fruits and vegetables and plenty of grains that have not been processed (whole grains). It is recommended that you eat: ? 1?2 cups of fruit every day. ? 2?3 cups of vegetables every day. ? 6?8 oz of whole grains every day, such as oats, whole wheat, bulgur, brown rice, quinoa, and millet.  Eat low-fat dairy products, such as milk, yogurt, and cheese.  Eat foods that contain healthy fats, such as nuts, avocado, olive oil, and canola oil.  Drink water throughout the day. Avoid drinks that contain added sugar, such as soda or sweet tea.  Follow instructions from your health care provider about specific eating or drinking restrictions.  Control how much food you eat at a time (portion size). ? Check food labels to find out the serving sizes of foods. ? Use a kitchen scale to weigh amounts of foods.  Saute or steam food instead of frying it. Cook with water or broth instead of oils or butter.  Limit your intake of: ? Salt (sodium). Have no more than 1 tsp (2,400 mg) of sodium a day. If you have heart disease or high blood pressure, have less than ? tsp (1,500 mg) of sodium a day. ? Saturated fat. This is fat that is solid at room temperature, such as butter or fat on meat. What lifestyle changes can be made? Activity   Do moderate-intensity physical activity for at least 30 minutes on at least 5 days of the week, or as much as told by your health care provider.  Ask your health care provider what activities are safe for you. A mix of physical activities may be best, such as walking, swimming,  cycling, and strength training.  Try to add physical activity into your day. For example: ? Park in spots that are farther away than usual, so that you walk more. For example, park in a far corner of the parking lot when you go to the office or the grocery store. ? Take a walk during your lunch break. ? Use stairs instead of elevators or escalators. Weight Loss  Lose weight as directed. Your health care provider can determine how much weight loss is best for you and can help you lose weight safely.  If you are overweight or obese,  you may be instructed to lose at least 5?7 % of your body weight. Alcohol and Tobacco   Limit alcohol intake to no more than 1 drink a day for nonpregnant women and 2 drinks a day for men. One drink equals 12 oz of beer, 5 oz of wine, or 1 oz of hard liquor.  Do not use any tobacco products, such as cigarettes, chewing tobacco, and e-cigarettes. If you need help quitting, ask your health care provider. Work With [place] Provider  Have your blood glucose tested regularly, as told by your health care provider.  Discuss your risk factors and how you can reduce your risk for diabetes.  Get screening tests as told by your health care provider. You may have screening tests regularly, especially if you have certain risk factors for type 2 diabetes.  Make an appointment with a diet and nutrition specialist (registered dietitian). A registered dietitian can help you make a healthy eating plan and can help you understand portion sizes and food labels. Why are these changes important?  It is possible to prevent or delay type 2 diabetes and related health problems by making lifestyle and nutrition changes.  It can be difficult to recognize signs of type 2 diabetes. The best way to avoid possible damage to your body is to take actions to prevent the disease before you develop symptoms. What can happen if changes are not made?  Your blood glucose levels may  keep increasing. Having high blood glucose for a long time is dangerous. Too much glucose in your blood can damage your blood vessels, heart, kidneys, nerves, and eyes.  You may develop prediabetes or type 2 diabetes. Type 2 diabetes can lead to many chronic health problems and complications, such as: ? Heart disease. ? Stroke. ? Blindness. ? Kidney disease. ? Depression. ? Poor circulation in the feet and legs, which could lead to surgical removal (amputation) in severe cases. Where to find support  Ask your health care provider to recommend a registered dietitian, diabetes educator, or weight loss program.  Look for local or online weight loss groups.  Join a gym, fitness club, or outdoor activity group, such as a walking club. Where to find more information To learn more about diabetes and diabetes prevention, visit:  American Diabetes Association (ADA): www.diabetes.[employer] of Diabetes and Digestive and Kidney Diseases: FindSpin.nl To learn more about healthy eating, visit:  The U.S. Department of Agriculture Scientist, research (physical sciences)), Choose My Plate: http://wiley-williams.com/  Office of Disease Prevention and Health Promotion (ODPHP), Dietary Guidelines: SurferLive.at Summary  You can reduce your risk for type 2 diabetes by increasing your physical activity, eating healthy foods, and losing weight as directed.  Talk with your health care provider about your risk for type 2 diabetes. Ask about any blood tests or screening tests that you need to have. This information is not intended to replace advice given to you by your health care provider. Make sure you discuss any questions you have with your health care provider. Document Released: 09/01/2015 Document Revised: 09/01/2018 Document Reviewed: 07/01/2015 Elsevier Patient Education  2020 [employer].

## 2019-05-16 ENCOUNTER — Other Ambulatory Visit: Payer: Self-pay | Admitting: Adult Health

## 2019-05-16 DIAGNOSIS — R7989 Other specified abnormal findings of blood chemistry: Secondary | ICD-10-CM

## 2019-05-16 LAB — URINALYSIS, ROUTINE W REFLEX MICROSCOPIC
Bilirubin Urine: NEGATIVE
Glucose, UA: NEGATIVE
Hgb urine dipstick: NEGATIVE
Ketones, ur: NEGATIVE
Leukocytes,Ua: NEGATIVE
Nitrite: NEGATIVE
Protein, ur: NEGATIVE
Specific Gravity, Urine: 1.017 (ref 1.001–1.03)
pH: 5.5 (ref 5.0–8.0)

## 2019-05-16 LAB — CBC WITH DIFFERENTIAL/PLATELET
Absolute Monocytes: 598 cells/uL (ref 200–950)
Basophils Absolute: 62 cells/uL (ref 0–200)
Basophils Relative: 0.7 %
Eosinophils Absolute: 282 cells/uL (ref 15–500)
Eosinophils Relative: 3.2 %
HCT: 45.9 % (ref 38.5–50.0)
Hemoglobin: 15.2 g/dL (ref 13.2–17.1)
Lymphs Abs: 2693 cells/uL (ref 850–3900)
MCH: 27 pg (ref 27.0–33.0)
MCHC: 33.1 g/dL (ref 32.0–36.0)
MCV: 81.5 fL (ref 80.0–100.0)
MPV: 11.4 fL (ref 7.5–12.5)
Monocytes Relative: 6.8 %
Neutro Abs: 5166 cells/uL (ref 1500–7800)
Neutrophils Relative %: 58.7 %
Platelets: 297 10*3/uL (ref 140–400)
RBC: 5.63 10*6/uL (ref 4.20–5.80)
RDW: 14.5 % (ref 11.0–15.0)
Total Lymphocyte: 30.6 %
WBC: 8.8 10*3/uL (ref 3.8–10.8)

## 2019-05-16 LAB — LIPID PANEL
Cholesterol: 164 mg/dL (ref <200)
HDL: 39 mg/dL — ABNORMAL LOW (ref >=40)
LDL Cholesterol (Calc): 104 mg/dL (calc) — ABNORMAL HIGH
Non-HDL Cholesterol (Calc): 125 mg/dL (calc) (ref <130)
Total CHOL/HDL Ratio: 4.2 (calc) (ref <5.0)
Triglycerides: 109 mg/dL (ref <150)

## 2019-05-16 LAB — COMPLETE METABOLIC PANEL WITH GFR
AG Ratio: 1.6 (calc) (ref 1.0–2.5)
ALT: 59 U/L — ABNORMAL HIGH (ref 9–46)
AST: 51 U/L — ABNORMAL HIGH (ref 10–35)
Albumin: 4.3 g/dL (ref 3.6–5.1)
Alkaline phosphatase (APISO): 56 U/L (ref 35–144)
BUN: 16 mg/dL (ref 7–25)
CO2: 28 mmol/L (ref 20–32)
Calcium: 9.3 mg/dL (ref 8.6–10.3)
Chloride: 102 mmol/L (ref 98–110)
Creat: 1.23 mg/dL (ref 0.70–1.33)
GFR, Est African American: 78 mL/min/{1.73_m2} (ref >=60)
GFR, Est Non African American: 68 mL/min/{1.73_m2} (ref >=60)
Globulin: 2.7 g/dL (calc) (ref 1.9–3.7)
Glucose, Bld: 95 mg/dL (ref 65–99)
Potassium: 3.8 mmol/L (ref 3.5–5.3)
Sodium: 138 mmol/L (ref 135–146)
Total Bilirubin: 0.5 mg/dL (ref 0.2–1.2)
Total Protein: 7 g/dL (ref 6.1–8.1)

## 2019-05-16 LAB — PSA: PSA: 1.8 ng/mL (ref <=4.0)

## 2019-05-16 LAB — MAGNESIUM: Magnesium: 2.1 mg/dL (ref 1.5–2.5)

## 2019-05-16 LAB — RPR: RPR Ser Ql: NONREACTIVE

## 2019-05-16 LAB — TSH: TSH: 1.16 mIU/L (ref 0.40–4.50)

## 2019-05-16 LAB — C. TRACHOMATIS/N. GONORRHOEAE RNA
C. trachomatis RNA, TMA: NOT DETECTED
N. gonorrhoeae RNA, TMA: NOT DETECTED

## 2019-05-16 LAB — VITAMIN D 25 HYDROXY (VIT D DEFICIENCY, FRACTURES): Vit D, 25-Hydroxy: 33 ng/mL (ref 30–100)

## 2019-05-16 LAB — MICROALBUMIN / CREATININE URINE RATIO
Creatinine, Urine: 121 mg/dL (ref 20–320)
Microalb Creat Ratio: 2 mcg/mg creat (ref <30)
Microalb, Ur: 0.2 mg/dL

## 2019-05-16 LAB — HIV ANTIBODY (ROUTINE TESTING W REFLEX): HIV 1&2 Ab, 4th Generation: NONREACTIVE

## 2019-06-11 ENCOUNTER — Other Ambulatory Visit: Payer: 59

## 2019-06-11 ENCOUNTER — Other Ambulatory Visit: Payer: Self-pay

## 2019-06-11 DIAGNOSIS — R7989 Other specified abnormal findings of blood chemistry: Secondary | ICD-10-CM

## 2019-06-11 LAB — HEPATIC FUNCTION PANEL
AG Ratio: 1.7 (calc) (ref 1.0–2.5)
ALT: 48 U/L — ABNORMAL HIGH (ref 9–46)
AST: 46 U/L — ABNORMAL HIGH (ref 10–35)
Albumin: 4.3 g/dL (ref 3.6–5.1)
Alkaline phosphatase (APISO): 47 U/L (ref 35–144)
Bilirubin, Direct: 0.1 mg/dL (ref 0.0–0.2)
Globulin: 2.5 g/dL (calc) (ref 1.9–3.7)
Indirect Bilirubin: 0.7 mg/dL (calc) (ref 0.2–1.2)
Total Bilirubin: 0.8 mg/dL (ref 0.2–1.2)
Total Protein: 6.8 g/dL (ref 6.1–8.1)

## 2019-06-12 ENCOUNTER — Other Ambulatory Visit: Payer: Self-pay | Admitting: Adult Health

## 2019-06-12 ENCOUNTER — Telehealth: Payer: Self-pay | Admitting: Adult Health

## 2019-06-12 DIAGNOSIS — R7989 Other specified abnormal findings of blood chemistry: Secondary | ICD-10-CM

## 2019-06-12 NOTE — Telephone Encounter (Signed)
Patient called regarding [place] order and asks for lab results. Relayed lab results and your recommendations. Patient asks if he can wait until follow up in 63mth for US Abdomen for elevated LFT. Patient states he wonders if labs may be elevated due to recent spine surgery in December 2020. States he is not able to exercise. Patient states he does not drink alcohol. Please advise your recommendation.

## 2019-06-20 ENCOUNTER — Ambulatory Visit
Admission: RE | Admit: 2019-06-20 | Discharge: 2019-06-20 | Disposition: A | Discharge status: Home / Self Care | Payer: 59 | Source: Ambulatory Visit | Attending: Adult Health | Admitting: Adult Health

## 2019-06-20 DIAGNOSIS — R7989 Other specified abnormal findings of blood chemistry: Secondary | ICD-10-CM

## 2019-06-21 ENCOUNTER — Encounter: Payer: Self-pay | Admitting: Adult Health

## 2019-06-21 DIAGNOSIS — K760 Fatty (change of) liver, not elsewhere classified: Secondary | ICD-10-CM | POA: Insufficient documentation

## 2019-08-29 ENCOUNTER — Encounter: Payer: Self-pay | Admitting: Internal Medicine

## 2019-12-20 NOTE — Progress Notes (Signed)
FOLLOW UP  Assessment and Plan:   Hypertension Atypically elevated; controlled per home checks; will have keep a log and bring BP cuff with him to follow up in 2 weeks  Monitor blood pressure at home; patient to call if consistently greater than 130/80 Continue DASH diet.   Reminder to go to the ER if any CP, SOB, nausea, dizziness, severe HA, changes vision/speech, left arm numbness and tingling and jaw pain.  Cholesterol Borderline elevated for several years; monitor and initiate statin for LDL 130+  Continue low cholesterol diet and exercise.  Check lipid panel.    Prediabetes Continue diet and exercise.  Perform daily foot/skin check, notify office of any concerning changes.  Check A1C at CPE; monitor weight, serum glucose  Morbid obesity - BMI 35 with OSA Long discussion about weight loss, diet, and exercise Recommended diet heavy in fruits and veggies and low in animal meats, cheeses, and dairy products, appropriate calorie intake Discussed ideal weight for height  Patient will work on checking fat percentage, start tracking calories/macros on app Will follow up in 3 months  Vitamin D Def Below goal at last visit; has increased supplement to reach goal of 70-100 Check Vit D level next visit   Testosterone deficiency Previously on supplementation, has been working on weight loss and exercise Last testosterone at normal range; continue zinc supplement and exercise; defer checking levels to CPE  Continue diet and meds as discussed. Further disposition pending results of labs. Discussed med's effects and SE's.   Over 30 minutes of exam, counseling, chart review, and critical decision making was performed.   Future Appointments  Date Time Provider [place]  01/04/2020  9:30 AM [facility] NURSE [facility] None  05/14/2020  2:00 PM Craig Comber, NP [facility] None     ----------------------------------------------------------------------------------------------------------------------  HPI 54 y.o. male  presents for 6 month follow up on hypertension, cholesterol, prediabetes, obesity, hypogonadism and vitamin D deficiency.    He is [facility] for [facility] headaches; [facility] which [facility]. Does chiropractor as needed. Takes amitriptyline 10 mg PRN, typically twice a month.   He is diagnosed with Meniere's disease and is followed as needed by [facility]- currently managed by hydrochlorothiazide, allergy medication, lifestyle.   He has hx of OSA previously with CPAP; struggled due to claustrophobia, felt worse with machine, had a repeat sleep study scheduled through the [place]. Recently on 05/02/2019 underwent hypoglossal nerve stimulator placement by ENT Dr. Melida [name], reports has recovered well from procedure, improved snoring on his side but still snores on his back.   He has GERD well controlled by medication.  BMI is Body mass index is 35.89 kg/m., he has been working on diet and exercise - walks 6 miles every other day, 50 pushups daily. Avoiding saturated fats. He reports still getting takeout, but will make last over lunch and dinner. Will eat rice/fries last and not finish.  Wt Readings from Last 3 Encounters:  12/21/19 (!) 272 lb (123.4 kg)  05/15/19 260 lb (117.9 kg)  05/02/19 260 lb (117.9 kg)   His blood pressure has been controlled at home(120s/70s), today their BP is BP: (!) 142/94  He does workout. He denies chest pain, shortness of breath, dizziness.   He is not on cholesterol medication and denies myalgias. His cholesterol is not at goal. The cholesterol last visit was:   Lab Results  Component Value Date   CHOL 164 05/15/2019   HDL 39 (  L) 05/15/2019   LDLCALC 104 (H) 05/15/2019   TRIG 109 05/15/2019   CHOLHDL 4.2 05/15/2019    He has been working on diet and exercise for  prediabetes, and denies foot ulcerations, increased appetite, nausea, paresthesia of the feet, polydipsia, polyuria, visual disturbances, vomiting and weight loss. Last A1C in the office was:  Lab Results  Component Value Date   HGBA1C 6.3 (H) 04/30/2019   Lab Results  Component Value Date   GFRAA 78 05/15/2019   Patient is on Vitamin D supplement but remains well below goal of 60 at most recent check, currently taking 5000 IU daily but forgetting to take regularly:   Lab Results  Component Value Date   VD25OH 33 05/15/2019     He has a history of testosterone deficiency but is not currently on supplement, was recommended weight loss and zinc supplement:   Lab Results  Component Value Date   TESTOSTERONE 315 05/10/2018  He is on cialis daily via urology [place] and reports doing well with this.  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 Lovena [name] 2/28/202, recommended for annual follow ups    Current Medications:  Current Outpatient Medications on File Prior to Visit  Medication Sig  . carboxymethylcellulose (REFRESH TEARS) 0.5 % SOLN Place 1 drop into both eyes 3 (three) times daily as needed (dry eyes).  . cetirizine (ZYRTEC) 10 MG tablet Take 1 tablet (10 mg total) by mouth daily. (Patient taking differently: Take 10 mg by mouth See admin instructions. Take 10 mg daily for 1 week then take loratadine 10 mg daily for 1 week, alternating weeks)  . Cholecalciferol (VITAMIN D3) 50 MCG (2000 UT) capsule Take 4,000 Units by mouth daily.  . lansoprazole (PREVACID) 15 MG capsule Take 15 mg by mouth daily.  Marland Kitchen lisinopril-hydrochlorothiazide (PRINZIDE,ZESTORETIC) 20-12.5 MG tablet Take 1 tablet by mouth daily.  . magnesium oxide (MAG-OX) 400 MG tablet Take 400 mg by mouth daily.  . nortriptyline (PAMELOR) 10 MG capsule Take 10 mg by mouth daily as needed (migraines).  . vitamin E (VITAMIN E) 1000 UNIT capsule Take 1,000 Units by  mouth daily.  . Zinc 50 MG TABS Take 50 mg by mouth daily.    No current facility-administered medications on file prior to visit.     Allergies:  Allergies  Allergen Reactions  . Ziac [Bisoprolol-Hydrochlorothiazide]     Erectile dysfunction     Medical History:  Past Medical History:  Diagnosis Date  . 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 GERD (gastroesophageal reflux disease)   . 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 Hyperlipidemia   . Hypertension   . Hypogonadism male   . Lumbar spinal stenosis 07/31/2014  . Pre-diabetes   . PTSD (post-traumatic stress disorder)   . PTSD (post-traumatic stress disorder)   . Sleep apnea    pt does not use CPAP  . Tinnitus   . Vertigo    Family history- Reviewed and unchanged Social history- Reviewed and unchanged   Review of Systems:  Review of Systems  Constitutional: Negative for malaise/fatigue and weight loss.  HENT: Negative for hearing loss and tinnitus.   Eyes: Negative for blurred vision and double vision.  Respiratory: Negative for cough, shortness of breath and wheezing.   Cardiovascular: Negative for chest pain, palpitations,  orthopnea, claudication and leg swelling.  Gastrointestinal: Negative for abdominal pain, blood in stool, constipation, diarrhea, heartburn, melena, nausea and vomiting.  Genitourinary: Negative.   Musculoskeletal: Negative for joint pain and myalgias.  Skin: Negative for rash.  Neurological: Negative for dizziness, tingling, sensory change, weakness and headaches.  Endo/Heme/Allergies: Negative for polydipsia.  Psychiatric/Behavioral: Negative.   All other systems reviewed and are negative.   Physical Exam: BP (!) 142/94   Pulse 74   Temp (!) 97.3 F (36.3 C)   Wt (!) 272 lb (123.4 kg)   SpO2 98%   BMI 35.89 kg/m  Wt Readings from Last 3 Encounters:  12/21/19 (!) 272 lb (123.4 kg)  05/15/19 260 lb  (117.9 kg)  05/02/19 260 lb (117.9 kg)   General Appearance: Well nourished, in no apparent distress. Eyes: PERRLA, EOMs, conjunctiva no swelling or erythema Sinuses: No Frontal/maxillary tenderness ENT/Mouth: Ext aud canals clear, TMs without erythema, bulging. No erythema, swelling, or exudate on post pharynx.  Tonsils not swollen or erythematous. Hearing normal.  Neck: Supple, thyroid normal.  Respiratory: Respiratory effort normal, BS equal bilaterally without rales, rhonchi, wheezing or stridor.  Cardio: RRR with no MRGs. Brisk peripheral pulses without edema.  Abdomen: Soft, + BS.  Non tender, no guarding, rebound, hernias, masses. Lymphatics: Non tender without lymphadenopathy.  Musculoskeletal: Full ROM, 5/5 strength, Normal gait Skin: Warm, dry without rashes, lesions, ecchymosis.  Neuro: Cranial nerves intact. No cerebellar symptoms.  Psych: Awake and oriented X 3, normal affect, Insight and Judgment appropriate.    Craig Ribas, NP 9:42 AM [name]

## 2019-12-21 ENCOUNTER — Ambulatory Visit: Payer: 59 | Admitting: Adult Health

## 2019-12-21 ENCOUNTER — Other Ambulatory Visit: Payer: Self-pay

## 2019-12-21 ENCOUNTER — Encounter: Payer: Self-pay | Admitting: Adult Health

## 2019-12-21 VITALS — BP 142/94 | HR 74 | Temp 97.3°F | Wt 272.0 lb

## 2019-12-21 DIAGNOSIS — K2100 Gastro-esophageal reflux disease with esophagitis, without bleeding: Secondary | ICD-10-CM

## 2019-12-21 DIAGNOSIS — R7303 Prediabetes: Secondary | ICD-10-CM

## 2019-12-21 DIAGNOSIS — E559 Vitamin D deficiency, unspecified: Secondary | ICD-10-CM

## 2019-12-21 DIAGNOSIS — E782 Mixed hyperlipidemia: Secondary | ICD-10-CM

## 2019-12-21 DIAGNOSIS — I10 Essential (primary) hypertension: Secondary | ICD-10-CM

## 2019-12-21 DIAGNOSIS — K760 Fatty (change of) liver, not elsewhere classified: Secondary | ICD-10-CM | POA: Diagnosis not present

## 2019-12-21 DIAGNOSIS — G4733 Obstructive sleep apnea (adult) (pediatric): Secondary | ICD-10-CM | POA: Diagnosis not present

## 2019-12-21 DIAGNOSIS — Z79899 Other long term (current) drug therapy: Secondary | ICD-10-CM

## 2019-12-21 DIAGNOSIS — E349 Endocrine disorder, unspecified: Secondary | ICD-10-CM

## 2019-12-21 MED ORDER — TADALAFIL 20 MG PO TABS
20.0000 mg | ORAL_TABLET | Freq: Every day | ORAL | 1 refills | Status: DC | PRN
Start: 2019-12-21 — End: 2019-12-21

## 2019-12-21 MED ORDER — TADALAFIL 20 MG PO TABS: 20.0000 mg | ORAL_TABLET | Freq: Every day | ORAL | 1 refills | Status: DC | PRN

## 2019-12-21 NOTE — Patient Instructions (Addendum)
Goals    . Blood Pressure < 130/80    . DIET - INCREASE WATER INTAKE     65-80+    . DIET - REDUCE SUGAR INTAKE     Recommend quit sodas, limit to occasional intake     . HEMOGLOBIN A1C < 5.7    . Weight (lb) < 245 lb (111.1 kg)      Please keep a daily log of blood pressures to bring with you and your blood pressure cuff to your recheck nurse visit    HYPERTENSION INFORMATION  Monitor your blood pressure at home, please keep a record and bring that in with you to your next office visit.   Go to the ER if any CP, SOB, nausea, dizziness, severe HA, changes vision/speech  Testing/Procedures: HOW TO TAKE YOUR BLOOD PRESSURE:  Rest 5 minutes before taking your blood pressure.  Don't smoke or drink caffeinated beverages for at least 30 minutes before.  Take your blood pressure before (not after) you eat.  Sit comfortably with your back supported and both feet on the floor (don't cross your legs).  Elevate your arm to heart level on a table or a desk.  Use the proper sized cuff. It should fit smoothly and snugly around your bare upper arm. There should be enough room to slip a fingertip under the cuff. The bottom edge of the cuff should be 1 inch above the crease of the elbow.   There was a study that showed taking your blood pressure medications at night decrease cardiovascular events. However if you are on a fluid pill, please take this in the morning.   Your most recent BP: BP: (!) 142/94   Take your medications faithfully as instructed. Maintain a healthy weight. Get at least 150 minutes of aerobic exercise per week. Minimize salt intake. Minimize alcohol intake  DASH Eating Plan DASH stands for "Dietary Approaches to Stop Hypertension." The DASH eating plan is a healthy eating plan that has been shown to reduce high blood pressure (hypertension). Additional health benefits may include reducing the risk of type 2 diabetes mellitus, heart disease, and stroke. The DASH  eating plan may also help with weight loss. WHAT DO I NEED TO KNOW ABOUT THE DASH EATING PLAN? For the DASH eating plan, you will follow these general guidelines:  Choose foods with a percent daily value for sodium of less than 5% (as listed on the food label).  Use salt-free seasonings or herbs instead of table salt or sea salt.  Check with your health care provider or pharmacist before using salt substitutes.  Eat lower-sodium products, often labeled as "lower sodium" or "no salt added."  Eat fresh foods.  Eat more vegetables, fruits, and low-fat dairy products.  Choose whole grains. Look for the word "whole" as the first word in the ingredient list.  Choose fish and skinless chicken or [place] more often than red meat. Limit fish, poultry, and meat to 6 oz (170 g) each day.  Limit sweets, desserts, sugars, and sugary drinks.  Choose heart-healthy fats.  Limit cheese to 1 oz (28 g) per day.  Eat more home-cooked food and less restaurant, buffet, and fast food.  Limit fried foods.  Cook foods using methods other than frying.  Limit canned vegetables. If you do use them, rinse them well to decrease the sodium.  When eating at a restaurant, ask that your food be prepared with less salt, or no salt if possible. WHAT FOODS CAN I EAT? Seek  help from a dietitian for individual calorie needs. Grains Whole grain or whole wheat bread. Brown rice. Whole grain or whole wheat pasta. Quinoa, bulgur, and whole grain cereals. Low-sodium cereals. Corn or whole wheat flour tortillas. Whole grain cornbread. Whole grain crackers. Low-sodium crackers. Vegetables Fresh or frozen vegetables (raw, steamed, roasted, or grilled). Low-sodium or reduced-sodium tomato and vegetable juices. Low-sodium or reduced-sodium tomato sauce and paste. Low-sodium or reduced-sodium canned vegetables.  Fruits All fresh, canned (in natural juice), or frozen fruits. Meat and Other Protein Products Ground beef (85%  or leaner), grass-fed beef, or beef trimmed of fat. Skinless chicken or [place]. Ground chicken or [place]. Pork trimmed of fat. All fish and seafood. Eggs. Dried beans, peas, or lentils. Unsalted nuts and seeds. Unsalted canned beans. Dairy Low-fat dairy products, such as skim or 1% milk, 2% or reduced-fat cheeses, low-fat ricotta or cottage cheese, or plain low-fat yogurt. Low-sodium or reduced-sodium cheeses. Fats and Oils Tub margarines without trans fats. Light or reduced-fat mayonnaise and salad dressings (reduced sodium). Avocado. Safflower, olive, or canola oils. Natural peanut or almond butter. Other Unsalted popcorn and pretzels. The items listed above may not be a complete list of recommended foods or beverages. Contact your dietitian for more options. WHAT FOODS ARE NOT RECOMMENDED? Grains White bread. White pasta. White rice. Refined cornbread. Bagels and croissants. Crackers that contain trans fat. Vegetables Creamed or fried vegetables. Vegetables in a cheese sauce. Regular canned vegetables. Regular canned tomato sauce and paste. Regular tomato and vegetable juices. Fruits Dried fruits. Canned fruit in light or heavy syrup. Fruit juice. Meat and Other Protein Products Fatty cuts of meat. Ribs, chicken wings, bacon, sausage, bologna, salami, chitterlings, fatback, hot dogs, bratwurst, and packaged luncheon meats. Salted nuts and seeds. Canned beans with salt. Dairy Whole or 2% milk, cream, half-and-half, and cream cheese. Whole-fat or sweetened yogurt. Full-fat cheeses or blue cheese. Nondairy creamers and whipped toppings. Processed cheese, cheese spreads, or cheese curds. Condiments Onion and garlic salt, seasoned salt, table salt, and sea salt. Canned and packaged gravies. Worcestershire sauce. Tartar sauce. Barbecue sauce. Teriyaki sauce. Soy sauce, including reduced sodium. Steak sauce. Fish sauce. Oyster sauce. Cocktail sauce. Horseradish. Ketchup and mustard. Meat flavorings  and tenderizers. Bouillon cubes. Hot sauce. Tabasco sauce. Marinades. Taco seasonings. Relishes. Fats and Oils Butter, stick margarine, lard, shortening, ghee, and bacon fat. Coconut, palm kernel, or palm oils. Regular salad dressings. Other Pickles and olives. Salted popcorn and pretzels. The items listed above may not be a complete list of foods and beverages to avoid. Contact your dietitian for more information. WHERE CAN I FIND MORE INFORMATION? National Heart, Lung, and Blood Institute: travelstabloid.com Document Released: 04/29/2011 Document Revised: 09/24/2013 Document Reviewed: 03/14/2013 [facility] Patient Information 2015 [place], [place]. This information is not intended to replace advice given to you by your health care provider. Make sure you discuss any questions you have with your health care provider.

## 2019-12-22 LAB — TSH: TSH: 2.23 mIU/L (ref 0.40–4.50)

## 2019-12-22 LAB — CBC WITH DIFFERENTIAL/PLATELET
Absolute Monocytes: 589 cells/uL (ref 200–950)
Basophils Absolute: 31 cells/uL (ref 0–200)
Basophils Relative: 0.5 %
Eosinophils Absolute: 217 cells/uL (ref 15–500)
Eosinophils Relative: 3.5 %
HCT: 45.4 % (ref 38.5–50.0)
Hemoglobin: 14.7 g/dL (ref 13.2–17.1)
Lymphs Abs: 1798 cells/uL (ref 850–3900)
MCH: 27.2 pg (ref 27.0–33.0)
MCHC: 32.4 g/dL (ref 32.0–36.0)
MCV: 83.9 fL (ref 80.0–100.0)
MPV: 11.3 fL (ref 7.5–12.5)
Monocytes Relative: 9.5 %
Neutro Abs: 3565 cells/uL (ref 1500–7800)
Neutrophils Relative %: 57.5 %
Platelets: 248 10*3/uL (ref 140–400)
RBC: 5.41 10*6/uL (ref 4.20–5.80)
RDW: 15.1 % — ABNORMAL HIGH (ref 11.0–15.0)
Total Lymphocyte: 29 %
WBC: 6.2 10*3/uL (ref 3.8–10.8)

## 2019-12-22 LAB — COMPLETE METABOLIC PANEL WITH GFR
AG Ratio: 1.5 (calc) (ref 1.0–2.5)
ALT: 31 U/L (ref 9–46)
AST: 31 U/L (ref 10–35)
Albumin: 4.3 g/dL (ref 3.6–5.1)
Alkaline phosphatase (APISO): 51 U/L (ref 35–144)
BUN: 18 mg/dL (ref 7–25)
CO2: 28 mmol/L (ref 20–32)
Calcium: 8.9 mg/dL (ref 8.6–10.3)
Chloride: 105 mmol/L (ref 98–110)
Creat: 1.1 mg/dL (ref 0.70–1.33)
GFR, Est African American: 89 mL/min/{1.73_m2} (ref >=60)
GFR, Est Non African American: 77 mL/min/{1.73_m2} (ref >=60)
Globulin: 2.8 g/dL (calc) (ref 1.9–3.7)
Glucose, Bld: 96 mg/dL (ref 65–99)
Potassium: 3.9 mmol/L (ref 3.5–5.3)
Sodium: 139 mmol/L (ref 135–146)
Total Bilirubin: 0.4 mg/dL (ref 0.2–1.2)
Total Protein: 7.1 g/dL (ref 6.1–8.1)

## 2019-12-22 LAB — LIPID PANEL
Cholesterol: 169 mg/dL (ref <200)
HDL: 44 mg/dL (ref >=40)
LDL Cholesterol (Calc): 105 mg/dL (calc) — ABNORMAL HIGH
Non-HDL Cholesterol (Calc): 125 mg/dL (calc) (ref <130)
Total CHOL/HDL Ratio: 3.8 (calc) (ref <5.0)
Triglycerides: 103 mg/dL (ref <150)

## 2019-12-22 LAB — HEMOGLOBIN A1C
Hgb A1c MFr Bld: 5.9 % of total Hgb — ABNORMAL HIGH (ref <5.7)
Mean Plasma Glucose: 123 (calc)
eAG (mmol/L): 6.8 (calc)

## 2019-12-22 LAB — MAGNESIUM: Magnesium: 2.1 mg/dL (ref 1.5–2.5)

## 2019-12-22 LAB — VITAMIN D 25 HYDROXY (VIT D DEFICIENCY, FRACTURES): Vit D, 25-Hydroxy: 79 ng/mL (ref 30–100)

## 2019-12-24 NOTE — Progress Notes (Signed)
PATIENT IS AWARE OF LAB RESULTS. -E WELCH

## 2020-01-03 ENCOUNTER — Other Ambulatory Visit: Payer: Self-pay

## 2020-01-03 ENCOUNTER — Ambulatory Visit (INDEPENDENT_AMBULATORY_CARE_PROVIDER_SITE_OTHER): Payer: 59 | Admitting: *Deleted

## 2020-01-03 VITALS — BP 126/90 | HR 80 | Temp 97.4°F | Wt 268.0 lb

## 2020-01-03 DIAGNOSIS — I10 Essential (primary) hypertension: Secondary | ICD-10-CM | POA: Diagnosis not present

## 2020-01-03 NOTE — Progress Notes (Signed)
Patient is here for a NV to recheck his blood pressure. Today his BP is 126/90. He had a log of his blood pressure from home that ranged from 117/73 to 136/88. Per [name], NP, the patient was advised to continue on Lisinopril-HCTZ 20-12.5 mg and bring a log of his blood pressure to is next appointment.

## 2020-01-04 ENCOUNTER — Ambulatory Visit: Payer: 59

## 2020-02-20 IMAGING — US US ABDOMEN COMPLETE
1 series · 13 of 25 positions shown · non-contrast
Comparison: Abdominal MRI October 15, 2006

CLINICAL DATA: Elevated liver enzymes. Reported history of renal
cell carcinoma

EXAM:
ABDOMEN ULTRASOUND COMPLETE

[Series 1: us abdomen complete · 0.23mm/px · 13 of 87 slices shown]
[im 1/87]
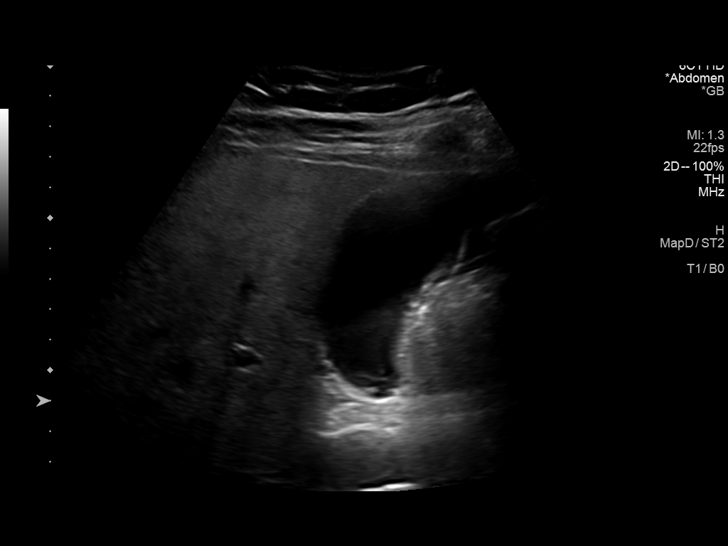
[im 8/87]
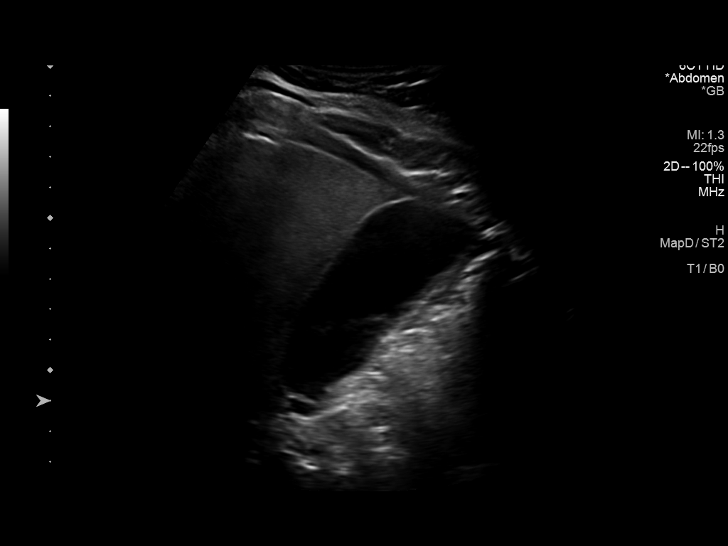
[im 15/87]
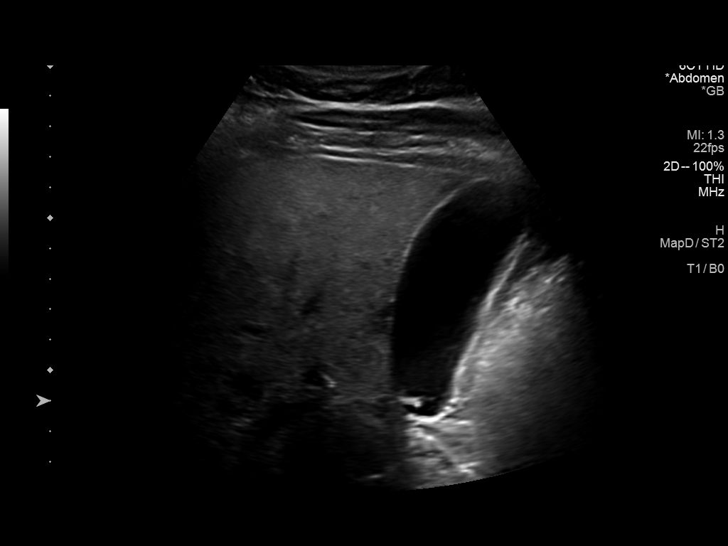
[im 22/87]
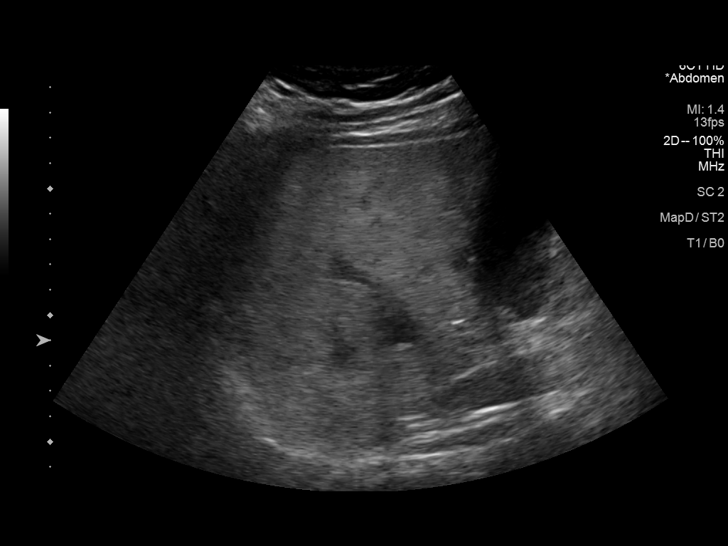
[im 29/87]
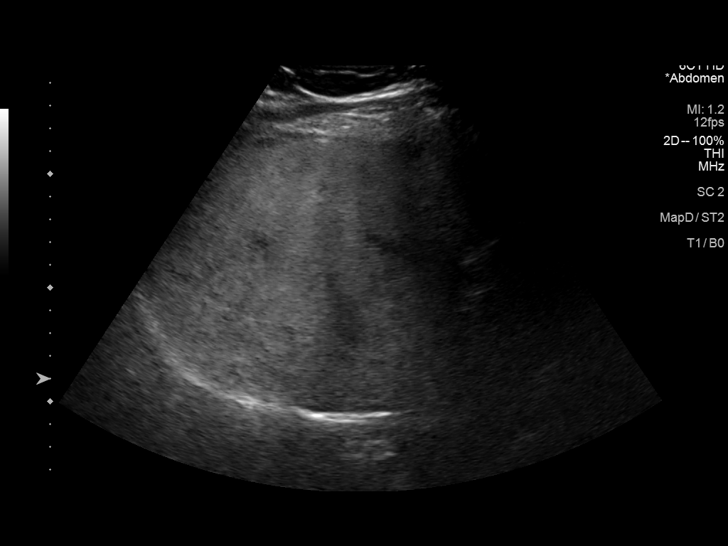
[im 36/87]
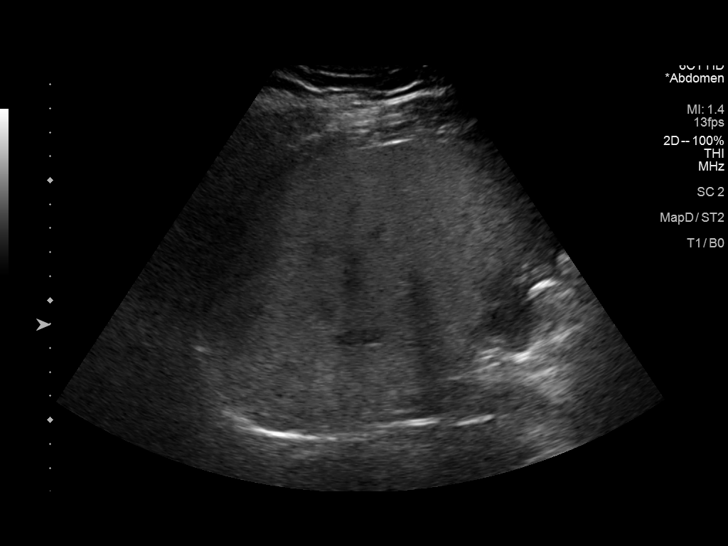
[im 44/87]
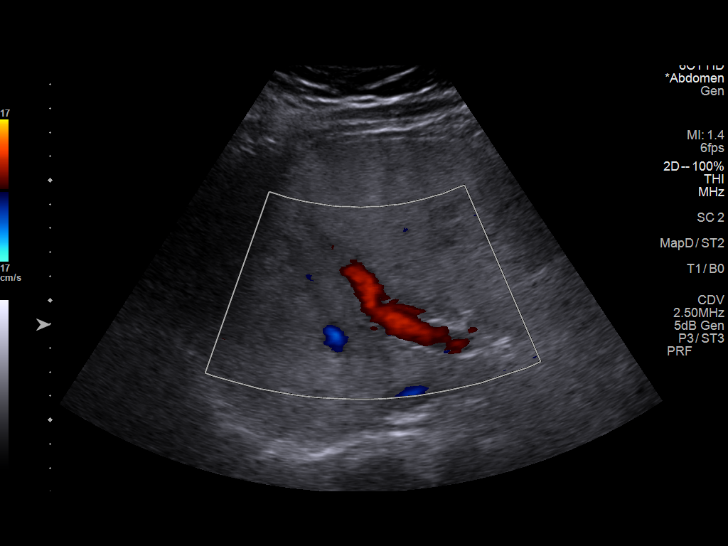
[im 51/87]
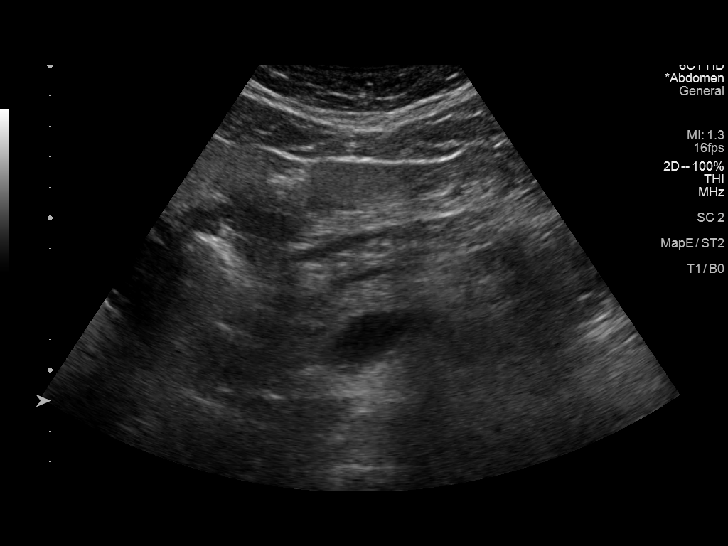
[im 58/87]
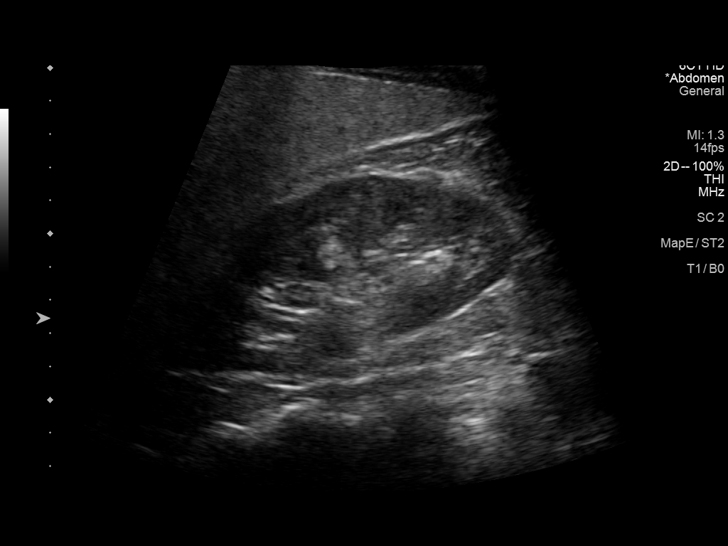
[im 65/87]
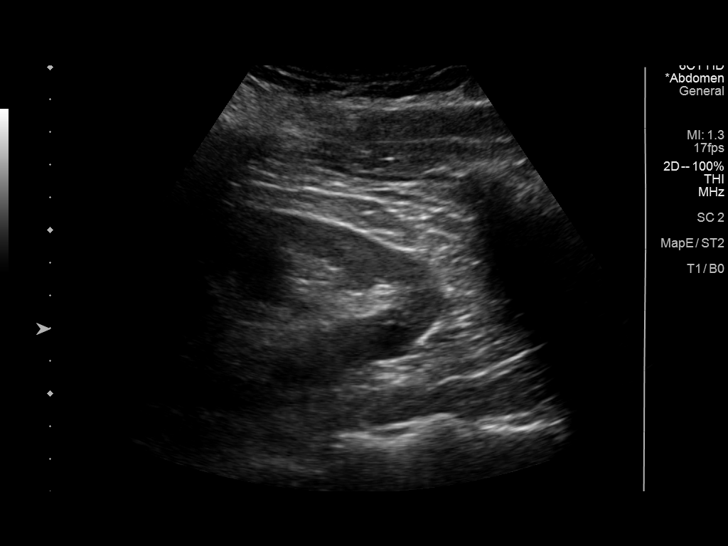
[im 72/87]
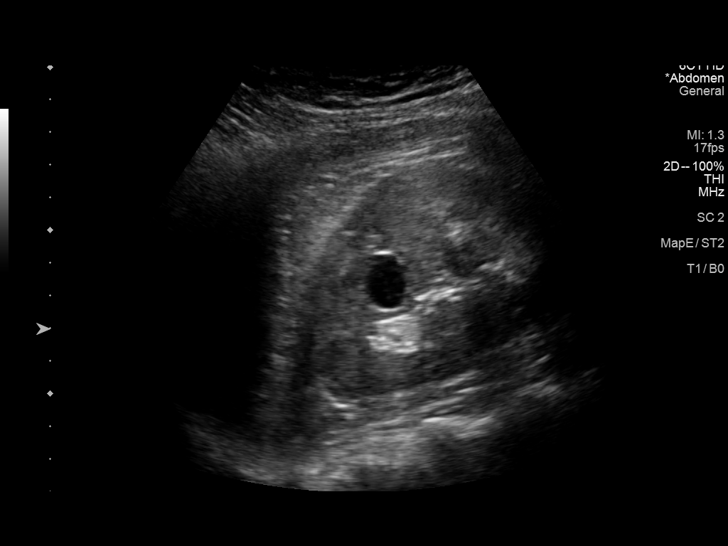
[im 79/87]
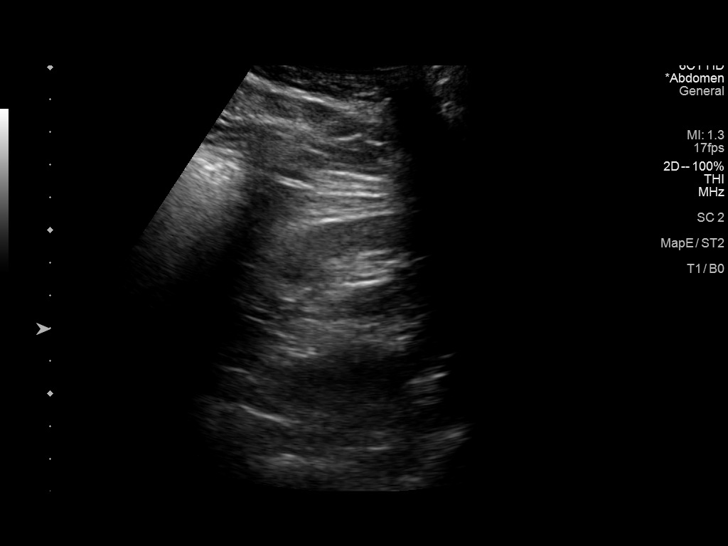
[im 87/87]
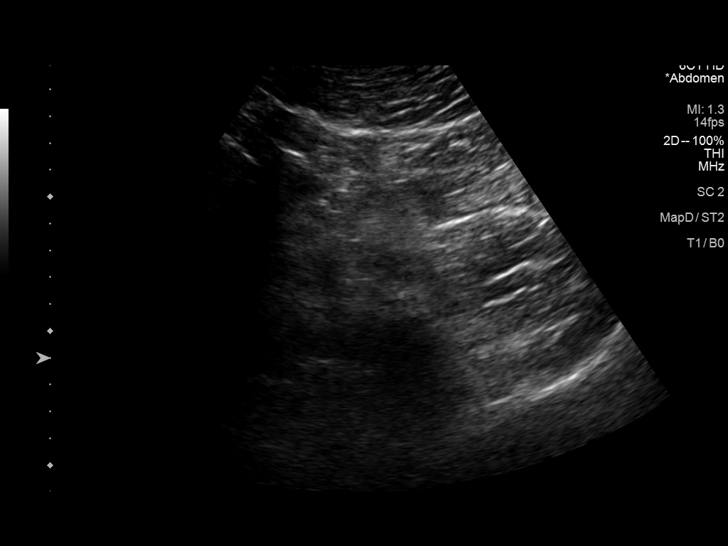

[13 of 25 positions shown; findings below may reference images not displayed]

FINDINGS: Gallbladder: No gallstones or wall thickening visualized. There is
no pericholecystic fluid. No sonographic Murphy sign noted by
sonographer.

Common bile duct: Diameter: 5 mm. No intrahepatic, common hepatic,
or common bile duct dilatation.

Liver: No focal lesion identified. Liver echogenicity is increased
diffusely. Portal vein is patent on color Doppler imaging with
normal direction of blood flow towards the liver.

IVC: No abnormality visualized.

Pancreas: No pancreatic mass or inflammatory focus.

Spleen: Size and appearance within normal limits.

Right Kidney: Length: 10.7 cm. Echogenicity within normal limits. No
mass or hydronephrosis visualized.

Left Kidney: Length: 11.0 cm. Echogenicity within normal limits. No
hydronephrosis visualized. There is a cyst arising from the lower
pole of the left kidney measuring 1.3 x 1.1 x 1.5 cm. A cyst in the
upper pole of the left kidney measures 1 1.0 x 1.6 x 1.3 cm. There
has been interval removal of the previous complex mass from the left
kidney which reportedly represented a neoplasm.

Abdominal aorta: No aneurysm visualized.

Other findings: No demonstrable ascites.
IMPRESSION: 1. Increased liver echogenicity, a finding indicative of hepatic
steatosis. While no focal liver lesions are evident on this study,
it must be cautioned that the sensitivity of ultrasound for
detection of focal liver lesions is diminished in this circumstance.

2. There are cysts in the left kidney. No noncystic renal lesions
evident on either side.

3.  Study otherwise unremarkable.

## 2020-03-27 ENCOUNTER — Encounter: Payer: Self-pay | Admitting: Adult Health Nurse Practitioner

## 2020-03-27 ENCOUNTER — Other Ambulatory Visit: Payer: Self-pay

## 2020-03-27 ENCOUNTER — Ambulatory Visit: Payer: 59 | Admitting: Adult Health

## 2020-03-27 ENCOUNTER — Ambulatory Visit (INDEPENDENT_AMBULATORY_CARE_PROVIDER_SITE_OTHER): Payer: 59 | Admitting: Adult Health Nurse Practitioner

## 2020-03-27 VITALS — BP 130/88 | HR 86 | Temp 97.3°F | Ht 73.0 in | Wt 272.0 lb

## 2020-03-27 DIAGNOSIS — M549 Dorsalgia, unspecified: Secondary | ICD-10-CM

## 2020-03-27 DIAGNOSIS — R109 Unspecified abdominal pain: Secondary | ICD-10-CM | POA: Diagnosis not present

## 2020-03-27 NOTE — Progress Notes (Signed)
Assessment and Plan:   Zadiel was seen today for acute visit.  Diagnoses and all orders for this visit:  Left flank pain -     CBC with Differential/Platelet -     COMPLETE METABOLIC PANEL WITH GFR -     Urinalysis w microscopic + reflex cultur  Musculoskeletal back pain Take ibuprofen 600mg  every 6 hours or 800mg  every 8 hours Use ice 41min at a time at 3-4 times a day, no heat   Discussed multiple etiologies, if labs look good consider prednisone taper.  If no improvement consider imaging rib fx?   Further disposition pending results if labs check today. Discussed med's effects and SE's.   Over 30 minutes of face to face interview, exam, counseling, chart review, and critical decision making was performed.    Future Appointments  Date Time Provider [place]  05/14/2020  2:00 PM [name], NP [facility] None    ------------------------------------------------------------------------------------------------------------------   HPI 54 y.o.male presents for achy that started on Tuesday of last week.  He received an injection at the [place], tramadol shot on Tuesday night.  He was also given flexiril for three times a day, he reports that it is not helping. He also has been taking advil gel caps, two at a time.  Thayt provided some relief.  The Sunday prior he fell getting out of bed, vertigo and he fell to the floor.  No initial injury.  Reports that it is the same side that he has kidney surgery one.  The pain is flank and rotates around to the front.  He reports that it does not hurt when he walks.  He reports that he has throbbing and achying when he sits down.  He reports it hurts to lay on that side.    Denies any dysuria, or hematuria.  He does reports that he takes beet powder and it changes the color of his urine.   Past Medical History:  Diagnosis Date  . 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  GERD (gastroesophageal reflux disease)   . 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 Hyperlipidemia   . Hypertension   . Hypogonadism male   . Lumbar spinal stenosis 07/31/2014  . Pre-diabetes   . PTSD (post-traumatic stress disorder)   . PTSD (post-traumatic stress disorder)   . Sleep apnea    pt does not use CPAP  . Tinnitus   . Vertigo      Allergies  Allergen Reactions  . Ziac [Bisoprolol-Hydrochlorothiazide]     Erectile dysfunction    Current Outpatient Medications on File Prior to Visit  Medication Sig  . carboxymethylcellulose (REFRESH TEARS) 0.5 % SOLN Place 1 drop into both eyes 3 (three) times daily as needed (dry eyes).  . cetirizine (ZYRTEC) 10 MG tablet Take 1 tablet (10 mg total) by mouth daily. (Patient taking differently: Take 10 mg by mouth See admin instructions. Take 10 mg daily for 1 week then take loratadine 10 mg daily for 1 week, alternating weeks)  . Cholecalciferol (VITAMIN D3) 50 MCG (2000 UT) capsule Take 4,000 Units by mouth daily.  . lansoprazole (PREVACID) 15 MG capsule Take 15 mg by mouth daily.  Marland Kitchen lisinopril-hydrochlorothiazide (PRINZIDE,ZESTORETIC) 20-12.5 MG tablet Take 1 tablet by mouth daily.  . magnesium oxide (MAG-OX) 400 MG tablet Take 400 mg by mouth daily.  Marland Kitchen  nortriptyline (PAMELOR) 10 MG capsule Take 10 mg by mouth daily as needed (migraines).  . tadalafil (CIALIS) 20 MG tablet Take 1 tablet (20 mg total) by mouth daily as needed for erectile dysfunction.  . vitamin E (VITAMIN E) 1000 UNIT capsule Take 1,000 Units by mouth daily.  . Zinc 50 MG TABS Take 50 mg by mouth daily.    No current facility-administered medications on file prior to visit.    ROS: all negative except above.   Physical Exam:  BP 130/88   Pulse 86   Temp (!) 97.3 F (36.3 C)   Ht 6\' 1"  (1.854 m)   Wt 272 lb (123.4 kg)   SpO2 96%   BMI 35.89 kg/m   General Appearance: Well nourished, in no apparent distress. Eyes: PERRLA, EOMs,  conjunctiva no swelling or erythema Respiratory: Respiratory effort normal, BS equal bilaterally without rales, rhonchi, wheezing or stridor.  Cardio: RRR with no MRGs. Brisk peripheral pulses without edema.  Abdomen: Soft, + BS.  Non tender, no guarding, rebound, hernias, masses. Lymphatics: Non tender without lymphadenopathy.  Musculoskeletal: Full ROM, 5/5 strength, normal gait. Point tenderness left lateral rib 9-1 extending to posterior.  No CVA tenderness noted. No hip pain. Skin: Warm, dry without rashes, lesions, ecchymosis. Surgical scar to left lateral. Neuro: Cranial nerves intact. Normal muscle tone, no cerebellar symptoms. Sensation intact.  Psych: Awake and oriented X 3, normal affect, Insight and Judgment appropriate.      Garnet Sierras, Laqueta Jean, DNP [facility] 03/27/2020  2:07 PM

## 2020-03-27 NOTE — Patient Instructions (Signed)
   We will call you with the lab results tomorrow.   Use ice to the area, 43min at least 4 times a day.  If kidney function looks good we will send in stronger medication.  In the mean time   Take ibuprofen 600mg  every 6 hours OR 800mg  every 8 hours. Take with food.

## 2020-03-28 ENCOUNTER — Telehealth: Payer: Self-pay

## 2020-03-28 ENCOUNTER — Other Ambulatory Visit: Payer: Self-pay | Admitting: Adult Health Nurse Practitioner

## 2020-03-28 DIAGNOSIS — M549 Dorsalgia, unspecified: Secondary | ICD-10-CM

## 2020-03-28 LAB — URINALYSIS W MICROSCOPIC + REFLEX CULTURE
Bacteria, UA: NONE SEEN /HPF
Bilirubin Urine: NEGATIVE
Glucose, UA: NEGATIVE
Hgb urine dipstick: NEGATIVE
Hyaline Cast: NONE SEEN /LPF
Ketones, ur: NEGATIVE
Leukocyte Esterase: NEGATIVE
Nitrites, Initial: NEGATIVE
Protein, ur: NEGATIVE
RBC / HPF: NONE SEEN /HPF (ref 0–2)
Specific Gravity, Urine: 1.016 (ref 1.001–1.03)
Squamous Epithelial / HPF: NONE SEEN /HPF (ref <=5)
WBC, UA: NONE SEEN /HPF (ref 0–5)
pH: <=5 (ref 5.0–8.0)

## 2020-03-28 LAB — CBC WITH DIFFERENTIAL/PLATELET
Absolute Monocytes: 705 cells/uL (ref 200–950)
Basophils Absolute: 49 cells/uL (ref 0–200)
Basophils Relative: 0.6 %
Eosinophils Absolute: 162 cells/uL (ref 15–500)
Eosinophils Relative: 2 %
HCT: 45.7 % (ref 38.5–50.0)
Hemoglobin: 15.2 g/dL (ref 13.2–17.1)
Lymphs Abs: 2122 cells/uL (ref 850–3900)
MCH: 27.3 pg (ref 27.0–33.0)
MCHC: 33.3 g/dL (ref 32.0–36.0)
MCV: 82 fL (ref 80.0–100.0)
MPV: 11.6 fL (ref 7.5–12.5)
Monocytes Relative: 8.7 %
Neutro Abs: 5063 cells/uL (ref 1500–7800)
Neutrophils Relative %: 62.5 %
Platelets: 276 10*3/uL (ref 140–400)
RBC: 5.57 10*6/uL (ref 4.20–5.80)
RDW: 14.4 % (ref 11.0–15.0)
Total Lymphocyte: 26.2 %
WBC: 8.1 10*3/uL (ref 3.8–10.8)

## 2020-03-28 LAB — COMPLETE METABOLIC PANEL WITH GFR
AG Ratio: 1.7 (calc) (ref 1.0–2.5)
ALT: 35 U/L (ref 9–46)
AST: 43 U/L — ABNORMAL HIGH (ref 10–35)
Albumin: 4.7 g/dL (ref 3.6–5.1)
Alkaline phosphatase (APISO): 56 U/L (ref 35–144)
BUN: 16 mg/dL (ref 7–25)
CO2: 25 mmol/L (ref 20–32)
Calcium: 9.8 mg/dL (ref 8.6–10.3)
Chloride: 102 mmol/L (ref 98–110)
Creat: 1.24 mg/dL (ref 0.70–1.33)
GFR, Est African American: 77 mL/min/{1.73_m2} (ref >=60)
GFR, Est Non African American: 66 mL/min/{1.73_m2} (ref >=60)
Globulin: 2.8 g/dL (calc) (ref 1.9–3.7)
Glucose, Bld: 91 mg/dL (ref 65–99)
Potassium: 3.9 mmol/L (ref 3.5–5.3)
Sodium: 137 mmol/L (ref 135–146)
Total Bilirubin: 0.4 mg/dL (ref 0.2–1.2)
Total Protein: 7.5 g/dL (ref 6.1–8.1)

## 2020-03-28 LAB — NO CULTURE INDICATED

## 2020-03-28 MED ORDER — PREDNISONE 10 MG (21) PO TBPK: ORAL_TABLET | Freq: Every day | ORAL | 0 refills | Status: DC

## 2020-03-28 NOTE — Telephone Encounter (Signed)
FYI:   Patient has called wanting lab results & patient was informed that his results had not been viewed by provider but as soon as they are viewed we will call with results.  Pt states he was tole this same thing by front office but still needed to call. Inform was confirmed & pt voiced understanding & hung up,

## 2020-03-28 NOTE — Progress Notes (Signed)
Spoke with patient via telephone.  All lab results discussed and provided opportunity for questions.  All questions answered.  Patient apologized for the repeated call to office he was anxious to get treatment started.  Acknowledged this and discussed plan, prednisone taper, take with food. Continue using ice.  Contact office with an y new or worsening symptoms.  Patient is agreeable to plan of care.  Craig [name], Laqueta Jean, DNP [facility] 03/28/2020  1:07 PM

## 2020-05-06 ENCOUNTER — Other Ambulatory Visit: Payer: Self-pay | Admitting: Adult Health

## 2020-05-13 NOTE — Progress Notes (Signed)
Complete Physical  Assessment and Plan:  Diagnoses and all orders for this visit:  Routine general medical examination at a health care facility Obtain shingrix and tetanus vaccine information from [place] Weight loss and increased water intake encouraged  Essential hypertension Continue medication Monitor blood pressure at home; call if consistently over 130/80 Continue DASH diet.   Reminder to go to the ER if any CP, SOB, nausea, dizziness, severe HA, changes vision/speech, left arm numbness and tingling and jaw pain.\  OSA (obstructive sleep apnea) CPAP poorly tolerant;  Recent hypoglossal nerve stimulator implantation, - followed by Craig [name] Weight loss encouraged  GERD Well managed on current medications -OTC PRN Discussed diet, avoiding triggers and other lifestyle changes  Meniere's disease, unspecified laterality Suspected diagnosis by [facility] ENT/audiology after episodes of decreased hearing, vertigo and syncope. Reportedly currently treated by lifestyle modification; surgery was discussed but declined at this time. No further episodes of syncope.   Hyperlipidemia Currently at goal with lifestyle modification -     Lipid panel -     TSH  Prediabetes Reviewed diet; recommended stop soda/processed carb intake Discussed disease and risks Discussed diet/exercise, weight management  -     Hemoglobin A1c   Vitamin D deficiency Continue supplementation  -     VITAMIN D 25 Hydroxy (Vit-D Deficiency, Fractures)  Medication management -     CBC with Differential/Platelet -     CMP/GFR -     Magnesium   Obesity (BMI 30.0-34.9) Long discussion about weight loss, diet, and exercise Recommended diet heavy in fruits and veggies and low in animal meats, cheeses, and dairy products, appropriate calorie intake Discussed appropriate weight for height REDUCE SODA Follow up at next visit  History of L kidney cancer/ renal cysts S/p partial nephrectomy, now monitored annually by  Craig [name]  BPH without symptoms Per urology notes; denies symptoms; monitor PSA annually  Erectile dysfunction Doing well with daily cialis; prescription refilled; follow up urology  Orders Placed This Encounter  Procedures  . CBC with Differential/Platelet  . COMPLETE METABOLIC PANEL WITH GFR  . Magnesium  . Lipid panel  . TSH  . Hemoglobin A1c  . VITAMIN D 25 Hydroxy (Vit-D Deficiency, Fractures)  . Microalbumin / creatinine urine ratio  . Urinalysis, Routine w reflex microscopic  . PSA  . EKG 12-Lead     Discussed med's effects and SE's. Screening labs and tests as requested with regular follow-up as recommended. Over 40 minutes of exam, counseling, chart review and critical decision making was performed  Future Appointments  Date Time Provider [place]  05/14/2021  2:00 PM Craig [name] [facility] None     HPI 54 y.o. AA male - . Patient presents for a complete physical. has Essential hypertension; Hyperlipidemia; Prediabetes; Vitamin D deficiency; OSA (obstructive sleep apnea); Medication management; GERD; TMJ (temporomandibular joint syndrome); Morbid obesity ([place]) - BMI 30+ with OSA; Meniere disease; Testosterone deficiency; BPH (benign prostatic hyperplasia); History of kidney cancer; and Hepatic steatosis on their problem list.   Retired early at 60 after working for [employer], divorced with two grown children.  New girlfriend, 9 months, using condoms, had negative STD testing in 2020.    He is being [facility] headaches; [facility] which [facility]. Does chiropractor as needed. Takes amitriptyline 10 mg PRN, typically twice a month.   He is diagnosed with Meniere's disease and is followed as needed by [facility]- currently managed  by hydrochlorothiazide, allergy medication, lifestyle.   He has hx of OSA previously with CPAP; struggled due to claustrophobia, felt worse with machine, had  a repeat sleep study scheduled through the [place]. On 05/02/2019 underwent hypoglossal nerve stimulator placement by ENT Craig [name], reports has recovered well from procedure, improved snoring on his side but still snores on his back. Planning follow up, possible adenoid removal.   He has GERD well controlled by medication.  BMI is Body mass index is 35.7 kg/m., he has been working on diet and exercise- he reports he walks 5 miles a day as weather allows which also helps his back stiffness significantly. Typically skips breakfast No ETOH. Soda (10 per week) Estimates 52 fluid ounces of water daily  Wt Readings from Last 3 Encounters:  05/14/20 270 lb 9.6 oz (122.7 kg)  03/27/20 272 lb (123.4 kg)  01/03/20 268 lb (121.6 kg)   He reports well controlled BPs at home, 120/70s, today their BP is BP: (!) 130/92 He does workout. He denies chest pain, shortness of breath, dizziness.   He is not on cholesterol medication and denies myalgias. His cholesterol is not at goal. The cholesterol last visit was:   Lab Results  Component Value Date   CHOL 169 12/21/2019   HDL 44 12/21/2019   LDLCALC 105 (H) 12/21/2019   TRIG 103 12/21/2019   CHOLHDL 3.8 12/21/2019   He has been working on diet and exercise for prediabetes, he is not on bASA, he is on ACE/ARB and denies increased appetite, nausea, paresthesia of the feet, polydipsia, polyuria, visual disturbances and vomiting. 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 On cialis 5 mg daily for ED and BPH.  Last PSA was: Lab Results  Component Value Date   PSA  1.8 05/15/2019   He has had mild persistent LFT elevation; [place] 06/20/2019 shows fatty liver;  Lab Results  Component Value Date   ALT 35 03/27/2020   AST 43 (H) 03/27/2020   ALKPHOS 52 03/23/2016   BILITOT 0.4 03/27/2020     Current Medications:  Current Outpatient Medications on File Prior to Visit  Medication Sig Dispense Refill  . carboxymethylcellulose (REFRESH TEARS) 0.5 % SOLN Place 1 drop into both eyes 3 (three) times daily as needed (dry eyes).    . cetirizine (ZYRTEC) 10 MG tablet Take 1 tablet (10 mg total) by mouth daily. (Patient taking differently: Take 10 mg by mouth See admin instructions. Take 10 mg daily for 1 week then take loratadine 10 mg daily for 1 week, alternating weeks) 90 tablet 4  . Cholecalciferol (VITAMIN D3) 50 MCG (2000 UT) capsule Take 4,000 Units by mouth daily.    . lansoprazole (PREVACID) 15 MG capsule Take 15 mg by mouth daily.    Marland Kitchen lisinopril-hydrochlorothiazide (PRINZIDE,ZESTORETIC) 20-12.5 MG tablet Take 1 tablet by mouth daily.    . magnesium oxide (MAG-OX) 400 MG tablet Take 400 mg by mouth daily.    . nortriptyline (PAMELOR) 10 MG capsule Take 10 mg by  mouth daily as needed (migraines).    . vitamin E 1000 UNIT capsule Take 1,000 Units by mouth daily.    . Zinc 50 MG TABS Take 50 mg by mouth daily.      No current facility-administered medications on file prior to visit.   Allergies:  Allergies  Allergen Reactions  . Ziac [Bisoprolol-Hydrochlorothiazide]     Erectile dysfunction   Health Maintenance:  Immunization History  Administered Date(s) Administered  . Influenza-Unspecified 03/24/2018, 03/26/2019  . PFIZER SARS-COV-2 Vaccination 07/19/2019, 08/09/2019  . PPD Test 12/18/2013, 02/11/2015  . Td 05/24/2010   Tetanus: 2012, reports has had since at the [place] Pneumovax: n/a Prevnar 13: n/a Flu vaccine: 03/2020 Shingrix: 2/2, 2021 at the [place] 19: 3/3, 2021, pfizer  Colonoscopy: 2018, due 2023  EGD: n/a  Eye Exam: Dr.  Lucita Ferrara, hx of cataracts, now going to my eye care at [place], last 2021 Dentist: Dr. Doristine Church, last 2021, q72m  Patient Care Team: Unk Pinto, MD as PCP - General (Internal Medicine)  Medical History:  has Essential hypertension; Hyperlipidemia; Prediabetes; Vitamin D deficiency; OSA (obstructive sleep apnea); Medication management; GERD; TMJ (temporomandibular joint syndrome); Morbid obesity ([place]) - BMI 30+ with OSA; Meniere disease; Testosterone deficiency; BPH (benign prostatic hyperplasia); History of kidney cancer; and Hepatic steatosis on their problem list. Surgical History:  He  has a past surgical history that includes Total hip arthroplasty (Right, 2007); Cataract extraction w/ intraocular lens implant (Left, 2014); Bunionectomy (Bilateral); Lumbar laminectomy/decompression microdiscectomy (Left, 07/31/2014); Cataract extraction (Right); Partial nephrectomy (Left, 2008); Drug induced endoscopy (N/A, 03/02/2019); Eye surgery; and Implantation of hypoglossal nerve stimulator (Right, 05/02/2019). Family History:  His family history includes Breast cancer in his mother and paternal grandmother; Hypertension in his maternal grandmother. Social History:   reports that he has never smoked. He has never used smokeless tobacco. He reports that he does not drink alcohol and does not use drugs.     Review of Systems:  Review of Systems  Constitutional: Negative for malaise/fatigue and weight loss.  HENT: Negative for ear discharge, ear pain, hearing loss and tinnitus.   Eyes: Negative for blurred vision and double vision.  Respiratory: Negative for cough, shortness of breath and wheezing.   Cardiovascular: Negative for chest pain, palpitations, orthopnea, claudication and leg swelling.  Gastrointestinal: Negative for abdominal pain, blood in stool, constipation, diarrhea, heartburn, melena, nausea and vomiting.  Genitourinary: Negative.   Musculoskeletal: Negative for back pain,  falls, joint pain and myalgias.  Skin: Negative for rash.  Neurological: Positive for dizziness (Intermittent; vertigo related to Meniere's). Negative for tingling, sensory change, weakness and headaches.  Endo/Heme/Allergies: Negative for polydipsia.  Psychiatric/Behavioral: Negative.   All other systems reviewed and are negative.   Physical Exam: Estimated body mass index is 35.7 kg/m as calculated from the following:   Height as of this encounter: 6\' 1"  (1.854 m).   Weight as of this encounter: 270 lb 9.6 oz (122.7 kg). BP (!) 130/92   Pulse 76   Temp (!) 97.3 F (36.3 C)   Ht 6\' 1"  (1.854 m)   Wt 270 lb 9.6 oz (122.7 kg)   SpO2 97%   BMI 35.70 kg/m  General Appearance: Well nourished, in no apparent distress.  Eyes: PERRLA, EOMs, conjunctiva no swelling or erythema, normal fundi and vessels.  Sinuses: No Frontal/maxillary tenderness  ENT/Mouth: Ext aud canals clear, normal light reflex with TMs without erythema, bulging. Good dentition. No erythema, swelling, or exudate on post pharynx. Tonsils not swollen or  erythematous. Hearing normal.  Neck: Supple, thyroid normal. No bruits. Hypoglossal stimulator palpable in right neck over anterior cervical chain region. Non-tender.  Respiratory: Respiratory effort normal, BS equal bilaterally without rales, rhonchi, wheezing or stridor.  Cardio: RRR without murmurs, rubs or gallops. Brisk peripheral pulses without edema.  Chest: symmetric, with normal excursions and percussion.  Abdomen: Soft, nontender, no guarding, rebound, hernias, masses, or organomegaly.  Lymphatics: Non tender without lymphadenopathy.  Genitourinary: Defer to urology Musculoskeletal: Full ROM all peripheral extremities,5/5 strength, and normal gait.  Skin: Warm, dry without rashes, lesions, ecchymosis. Well healed surgical scars to right lower/lateral chest, upper chest, right submandibular.  Neuro: Cranial nerves intact, reflexes equal bilaterally. Normal muscle  tone, no cerebellar symptoms. Sensation intact.  Psych: Awake and oriented X 3, normal affect, Insight and Judgment appropriate.   EKG: WNL no changes   Craig [name] 2:17 PM [facility]

## 2020-05-14 ENCOUNTER — Encounter: Payer: Self-pay | Admitting: Adult Health

## 2020-05-14 ENCOUNTER — Other Ambulatory Visit: Payer: Self-pay

## 2020-05-14 ENCOUNTER — Ambulatory Visit: Payer: 59 | Admitting: Adult Health

## 2020-05-14 VITALS — BP 130/92 | HR 76 | Temp 97.3°F | Ht 73.0 in | Wt 270.6 lb

## 2020-05-14 DIAGNOSIS — Z85528 Personal history of other malignant neoplasm of kidney: Secondary | ICD-10-CM

## 2020-05-14 DIAGNOSIS — N400 Enlarged prostate without lower urinary tract symptoms: Secondary | ICD-10-CM

## 2020-05-14 DIAGNOSIS — E349 Endocrine disorder, unspecified: Secondary | ICD-10-CM

## 2020-05-14 DIAGNOSIS — K2100 Gastro-esophageal reflux disease with esophagitis, without bleeding: Secondary | ICD-10-CM

## 2020-05-14 DIAGNOSIS — Z79899 Other long term (current) drug therapy: Secondary | ICD-10-CM

## 2020-05-14 DIAGNOSIS — H8109 Meniere's disease, unspecified ear: Secondary | ICD-10-CM

## 2020-05-14 DIAGNOSIS — Z1389 Encounter for screening for other disorder: Secondary | ICD-10-CM

## 2020-05-14 DIAGNOSIS — Z136 Encounter for screening for cardiovascular disorders: Secondary | ICD-10-CM | POA: Diagnosis not present

## 2020-05-14 DIAGNOSIS — Z1329 Encounter for screening for other suspected endocrine disorder: Secondary | ICD-10-CM

## 2020-05-14 DIAGNOSIS — Z0000 Encounter for general adult medical examination without abnormal findings: Secondary | ICD-10-CM | POA: Diagnosis not present

## 2020-05-14 DIAGNOSIS — I10 Essential (primary) hypertension: Secondary | ICD-10-CM | POA: Diagnosis not present

## 2020-05-14 DIAGNOSIS — E559 Vitamin D deficiency, unspecified: Secondary | ICD-10-CM

## 2020-05-14 DIAGNOSIS — G4733 Obstructive sleep apnea (adult) (pediatric): Secondary | ICD-10-CM

## 2020-05-14 DIAGNOSIS — R7303 Prediabetes: Secondary | ICD-10-CM

## 2020-05-14 DIAGNOSIS — M26609 Unspecified temporomandibular joint disorder, unspecified side: Secondary | ICD-10-CM

## 2020-05-14 DIAGNOSIS — Z131 Encounter for screening for diabetes mellitus: Secondary | ICD-10-CM

## 2020-05-14 DIAGNOSIS — K760 Fatty (change of) liver, not elsewhere classified: Secondary | ICD-10-CM

## 2020-05-14 DIAGNOSIS — Z125 Encounter for screening for malignant neoplasm of prostate: Secondary | ICD-10-CM

## 2020-05-14 DIAGNOSIS — E782 Mixed hyperlipidemia: Secondary | ICD-10-CM

## 2020-05-14 MED ORDER — TADALAFIL 5 MG PO TABS: ORAL_TABLET | ORAL | 1 refills | Status: DC

## 2020-05-14 NOTE — Patient Instructions (Addendum)
Craig [name] , Thank you for taking time to come for your Annual Wellness Visit. I appreciate your ongoing commitment to your health goals. Please review the following plan we discussed and let me know if I can assist you in the future.   These are the goals we discussed: Goals    . Blood Pressure < 130/80    . DIET - DECREASE SODA OR JUICE INTAKE    . DIET - INCREASE WATER INTAKE     65-80+    . HEMOGLOBIN A1C < 5.7    . Weight (lb) < 245 lb (111.1 kg)       This is a list of the screening recommended for you and due dates:  Health Maintenance  Topic Date Due  . COVID-19 Vaccine (3 - Booster for Pfizer series) 02/09/2020  . Tetanus Vaccine  05/24/2020  . Colon Cancer Screening  12/08/2021  . Flu Shot  Completed  .  Hepatitis C: One time screening is recommended by Center for Disease Control  (CDC) for  adults born from [birth year] through 1963.   Completed  . HIV Screening  Completed      Please get information about vaccines from [place] - shingles, tetanus (when and what kind)     Know what a healthy weight is for you (roughly BMI <25) and aim to maintain this  Aim for 7+ servings of fruits and vegetables daily  65-80+ fluid ounces of water or unsweet tea for healthy kidneys  Limit to max 1 drink of alcohol per day; avoid smoking/tobacco  Limit animal fats in diet for cholesterol and heart health - choose grass fed whenever available  Avoid highly processed foods, and foods high in saturated/trans fats  Aim for low stress - take time to unwind and care for your mental health  Aim for 150 min of moderate intensity exercise weekly for heart health, and weights twice weekly for bone health  Aim for 7-9 hours of sleep daily       Preventing Health Risks of Being Overweight Maintaining a healthy body weight is an important part of your overall health. Your healthy body weight depends on your age, gender, and height. Being overweight puts you at risk for many health problems,  including:  Heart disease.  Diabetes.  Problems sleeping.  Joint problems. You can make changes to your diet and lifestyle to prevent these risks. Consider working with a health care provider or a dietitian to make these changes. What nutrition changes can be made?   Eat only as much as your body needs. In most cases, this is about 2,000 calories a day, but the amount varies depending on your height, gender, and activity level. Ask your health care provider how many calories you should have each day. Eating more than your body needs on a regular basis can cause you to become overweight or obese.  Eat slowly, and stop eating when you feel full.  Choose healthy foods, including: ? Fruits and vegetables. ? Lean meats. ? Low-fat dairy products. ? High-fiber foods, such as whole grains and beans. ? Healthy snacks like vegetable sticks, a piece of fruit, or a small amount of yogurt or cheese.  Avoid foods and drinks that are high in sugar, salt (sodium), saturated fat, or trans fat. This includes: ? Many desserts such as candy, cookies, and ice cream. ? Soda. ? Fried foods. ? Processed meats such as hot dogs or lunch meats. ? Prepackaged snack foods. What lifestyle changes can be  made?   Exercise for at least 150 minutes a week to prevent weight gain, or as often as recommended by your health care provider. Do moderate-intensity exercise, such as brisk walking. ? Spread it out by exercising for 30 minutes 5 days a week, or in short 10-minute bursts several times a day.  Find other ways to stay active and burn calories, such as yard work or a hobby that involves physical activity.  Get at least 8 hours of sleep each night. When you are well-rested, you are more likely to be active and make healthy choices during the day. To sleep better: ? Try to go to bed and wake up at about the same time every day. ? Keep your bedroom dark, quiet, and cool. ? Make sure that your bed is  comfortable. ? Avoid stimulating activities, such as watching television or exercising, for at least one hour before bedtime. Why are these changes important? Eating healthy and being active helps you lose weight and prevent health problems caused by being overweight. Making these changes can also help you manage stress, feel better mentally, and connect with friends and family. What can happen if changes are not made? Being overweight can affect you for your entire life. You may develop joint or bone problems that make it painful or difficult for you to play sports or do activities you enjoy. Being overweight puts stress on your heart and lungs and can lead to medical problems like diabetes, heart disease, and sleeping problems. Where to find support You can get support for preventing health risks of being overweight from:  Your health care provider or a dietitian. They can provide guidance about healthy eating and healthy lifestyle choices.  Weight loss support groups, online or in-person. Where to find more information  MyPlate: FormerBoss.no ? This an online tool that provides personalized recommendations about foods to eat each day.  The Centers for Disease Control and Prevention: http://sharp-hammond.biz/ ? This resource gives tips for managing weight and having an active lifestyle. Summary  To prevent unhealthy weight gain, it is important to maintain a healthy diet high in vegetables and whole grains, exercise regularly, and get at least 8 hours of sleep each night.  Making these changes helps prevent many long-term (chronic) health conditions that can shorten your life, such as diabetes, heart disease, and stroke. This information is not intended to replace advice given to you by your health care provider. Make sure you discuss any questions you have with your health care provider. Document Revised: 01/31/2019 Document Reviewed: 04/06/2017 Elsevier Patient Education  [place].

## 2020-05-16 LAB — CBC WITH DIFFERENTIAL/PLATELET
Absolute Monocytes: 600 cells/uL (ref 200–950)
Basophils Absolute: 41 cells/uL (ref 0–200)
Basophils Relative: 0.6 %
Eosinophils Absolute: 242 cells/uL (ref 15–500)
Eosinophils Relative: 3.5 %
HCT: 42.6 % (ref 38.5–50.0)
Hemoglobin: 13.9 g/dL (ref 13.2–17.1)
Lymphs Abs: 2160 cells/uL (ref 850–3900)
MCH: 26.7 pg — ABNORMAL LOW (ref 27.0–33.0)
MCHC: 32.6 g/dL (ref 32.0–36.0)
MCV: 81.9 fL (ref 80.0–100.0)
MPV: 11.5 fL (ref 7.5–12.5)
Monocytes Relative: 8.7 %
Neutro Abs: 3857 cells/uL (ref 1500–7800)
Neutrophils Relative %: 55.9 %
Platelets: 243 10*3/uL (ref 140–400)
RBC: 5.2 10*6/uL (ref 4.20–5.80)
RDW: 14.5 % (ref 11.0–15.0)
Total Lymphocyte: 31.3 %
WBC: 6.9 10*3/uL (ref 3.8–10.8)

## 2020-05-16 LAB — COMPLETE METABOLIC PANEL WITH GFR
AG Ratio: 1.7 (calc) (ref 1.0–2.5)
ALT: 29 U/L (ref 9–46)
AST: 32 U/L (ref 10–35)
Albumin: 4.5 g/dL (ref 3.6–5.1)
Alkaline phosphatase (APISO): 53 U/L (ref 35–144)
BUN: 14 mg/dL (ref 7–25)
CO2: 28 mmol/L (ref 20–32)
Calcium: 9.2 mg/dL (ref 8.6–10.3)
Chloride: 103 mmol/L (ref 98–110)
Creat: 1.14 mg/dL (ref 0.70–1.33)
GFR, Est African American: 85 mL/min/{1.73_m2} (ref >=60)
GFR, Est Non African American: 74 mL/min/{1.73_m2} (ref >=60)
Globulin: 2.6 g/dL (calc) (ref 1.9–3.7)
Glucose, Bld: 85 mg/dL (ref 65–99)
Potassium: 3.7 mmol/L (ref 3.5–5.3)
Sodium: 140 mmol/L (ref 135–146)
Total Bilirubin: 0.6 mg/dL (ref 0.2–1.2)
Total Protein: 7.1 g/dL (ref 6.1–8.1)

## 2020-05-16 LAB — LIPID PANEL
Cholesterol: 166 mg/dL (ref <200)
HDL: 40 mg/dL (ref >=40)
LDL Cholesterol (Calc): 107 mg/dL (calc) — ABNORMAL HIGH
Non-HDL Cholesterol (Calc): 126 mg/dL (calc) (ref <130)
Total CHOL/HDL Ratio: 4.2 (calc) (ref <5.0)
Triglycerides: 96 mg/dL (ref <150)

## 2020-05-16 LAB — HEMOGLOBIN A1C
Hgb A1c MFr Bld: 6.1 % of total Hgb — ABNORMAL HIGH (ref <5.7)
Mean Plasma Glucose: 128 mg/dL
eAG (mmol/L): 7.1 mmol/L

## 2020-05-16 LAB — IRON,TIBC AND FERRITIN PANEL
%SAT: 27 % (calc) (ref 20–48)
Ferritin: 68 ng/mL (ref 38–380)
Iron: 83 ug/dL (ref 50–180)
TIBC: 310 mcg/dL (calc) (ref 250–425)

## 2020-05-16 LAB — URINALYSIS, ROUTINE W REFLEX MICROSCOPIC
Bilirubin Urine: NEGATIVE
Glucose, UA: NEGATIVE
Hgb urine dipstick: NEGATIVE
Ketones, ur: NEGATIVE
Leukocytes,Ua: NEGATIVE
Nitrite: NEGATIVE
Protein, ur: NEGATIVE
Specific Gravity, Urine: 1.02 (ref 1.001–1.03)
pH: 5.5 (ref 5.0–8.0)

## 2020-05-16 LAB — TEST AUTHORIZATION

## 2020-05-16 LAB — MICROALBUMIN / CREATININE URINE RATIO
Creatinine, Urine: 170 mg/dL (ref 20–320)
Microalb Creat Ratio: 3 mcg/mg creat (ref <30)
Microalb, Ur: 0.5 mg/dL

## 2020-05-16 LAB — TSH: TSH: 1.78 mIU/L (ref 0.40–4.50)

## 2020-05-16 LAB — PSA: PSA: 1.66 ng/mL (ref <=4.0)

## 2020-05-16 LAB — VITAMIN D 25 HYDROXY (VIT D DEFICIENCY, FRACTURES): Vit D, 25-Hydroxy: 55 ng/mL (ref 30–100)

## 2020-05-16 LAB — MAGNESIUM: Magnesium: 2 mg/dL (ref 1.5–2.5)

## 2020-07-31 ENCOUNTER — Other Ambulatory Visit (HOSPITAL_COMMUNITY): Payer: Self-pay | Admitting: Urology

## 2020-07-31 ENCOUNTER — Other Ambulatory Visit: Payer: Self-pay | Admitting: Urology

## 2020-07-31 DIAGNOSIS — Z85528 Personal history of other malignant neoplasm of kidney: Secondary | ICD-10-CM

## 2020-08-05 ENCOUNTER — Ambulatory Visit (HOSPITAL_COMMUNITY): Payer: Medicare Other

## 2020-11-03 ENCOUNTER — Other Ambulatory Visit: Payer: Self-pay

## 2020-11-03 ENCOUNTER — Other Ambulatory Visit (HOSPITAL_BASED_OUTPATIENT_CLINIC_OR_DEPARTMENT_OTHER): Payer: Self-pay

## 2020-11-03 DIAGNOSIS — G4733 Obstructive sleep apnea (adult) (pediatric): Secondary | ICD-10-CM

## 2020-11-03 MED ORDER — TADALAFIL 5 MG PO TABS: ORAL_TABLET | ORAL | 1 refills | Status: AC

## 2020-11-13 ENCOUNTER — Other Ambulatory Visit: Payer: Self-pay

## 2020-11-13 ENCOUNTER — Encounter: Payer: Self-pay | Admitting: Adult Health

## 2020-11-13 ENCOUNTER — Ambulatory Visit (INDEPENDENT_AMBULATORY_CARE_PROVIDER_SITE_OTHER): Payer: 59 | Admitting: Adult Health

## 2020-11-13 VITALS — BP 114/84 | HR 73 | Temp 97.3°F | Ht 73.0 in | Wt 264.0 lb

## 2020-11-13 DIAGNOSIS — H8109 Meniere's disease, unspecified ear: Secondary | ICD-10-CM

## 2020-11-13 DIAGNOSIS — G4733 Obstructive sleep apnea (adult) (pediatric): Secondary | ICD-10-CM

## 2020-11-13 DIAGNOSIS — E782 Mixed hyperlipidemia: Secondary | ICD-10-CM | POA: Diagnosis not present

## 2020-11-13 DIAGNOSIS — R7303 Prediabetes: Secondary | ICD-10-CM

## 2020-11-13 DIAGNOSIS — I10 Essential (primary) hypertension: Secondary | ICD-10-CM

## 2020-11-13 DIAGNOSIS — E559 Vitamin D deficiency, unspecified: Secondary | ICD-10-CM

## 2020-11-13 DIAGNOSIS — Z79899 Other long term (current) drug therapy: Secondary | ICD-10-CM

## 2020-11-13 NOTE — Progress Notes (Signed)
FOLLOW UP  Assessment and Plan:   Hypertension Well controlled Monitor blood pressure at home; patient to call if consistently greater than 130/80 Continue DASH diet.   Reminder to go to the ER if any CP, SOB, nausea, dizziness, severe HA, changes vision/speech, left arm numbness and tingling and jaw pain.  Cholesterol Borderline elevated for several years; monitor and initiate statin for LDL 130+  Continue low cholesterol diet and exercise.  Check lipid panel.    Prediabetes Continue diet and exercise.  Perform daily foot/skin check, notify office of any concerning changes.  Check A1C; monitor weight, serum glucose  Morbid obesity - BMI 34 with OSA Long discussion about weight loss, diet, and exercise Recommended diet heavy in fruits and veggies and low in animal meats, cheeses, and dairy products, appropriate calorie intake Discussed ideal weight for height  Patient will work on checking fat percentage Will follow up in 3 months  Vitamin D Def Below goal at last visit; has increased supplement to reach goal of 70-100 Check Vit D level next visit   Testosterone deficiency Previously on supplementation, has been working on weight loss and exercise Last testosterone at normal range; continue zinc supplement and exercise; defer checking levels to CPE  Continue diet and meds as discussed. Further disposition pending results of labs. Discussed med's effects and SE's.   Over 30 minutes of exam, counseling, chart review, and critical decision making was performed.   Future Appointments  Date Time Provider [place]  12/15/2020  8:00 PM Elmarie Mainland, MD [facility]  05/14/2021  2:00 PM [name], NP [facility] None    ----------------------------------------------------------------------------------------------------------------------  HPI 55 y.o. male  presents for 6 month follow up on hypertension, cholesterol, prediabetes, obesity, hypogonadism and  vitamin D deficiency.    He is being followed by the [facility] for frequent headaches; he is doing trial of "Cefaly" device with some benefit. Takes nortriptyline 10 mg PRN, recently 3 days a week or so.   He is diagnosed with Meniere's disease and is followed as needed by [facility]- currently managed by hydrochlorothiazide, allergy medication, lifestyle.   He has hx of OSA previously with CPAP; struggled due to claustrophobia, felt worse with machine, had a repeat sleep study scheduled through the [place]. On 05/02/2019 underwent hypoglossal nerve stimulator placement by ENT Dr. [name], has some persistent snoring, has upcoming sleep study.   He has GERD well controlled by lansoprazole 15 mg.   BMI is Body mass index is 34.83 kg/m., he has been working on diet and exercise - walks 6 miles every other day, working with trainer 3 days a week. Avoiding saturated fats. He reports still getting takeout, but will make last over lunch and dinner. Will eat rice/fries last and not finish. He reports suit shoulders are tighter, believes he has gained muscle, waist size is down. Wt Readings from Last 3 Encounters:  11/13/20 264 lb (119.7 kg)  05/14/20 270 lb 9.6 oz (122.7 kg)  03/27/20 272 lb (123.4 kg)   His blood pressure has been controlled at home (120s/70s), today their BP is BP: 114/84  He does workout. He denies chest pain, shortness of breath, dizziness.   He is not on cholesterol medication and denies myalgias. His cholesterol is not at goal. The cholesterol last visit was:   Lab Results  Component Value Date   CHOL 166 05/14/2020   HDL 40 05/14/2020   LDLCALC 107 (H) 05/14/2020   TRIG 96 05/14/2020   CHOLHDL 4.2 05/14/2020  He has been working on diet and exercise for prediabetes, and denies foot ulcerations, increased appetite, nausea, paresthesia of the feet, polydipsia, polyuria, visual disturbances, vomiting and weight loss. Last A1C in the office was:  Lab Results  Component Value Date    HGBA1C 6.1 (H) 05/14/2020   Lab Results  Component Value Date   GFRAA 85 05/14/2020   Patient is on Vitamin D supplement, taking 5000 IU daily  Lab Results  Component Value Date   VD25OH 34 05/14/2020     He has a history of testosterone deficiency but is not currently on supplement, was recommended weight loss and zinc supplement:   Lab Results  Component Value Date   TESTOSTERONE 315 05/10/2018   He is on cialis daily via urology [place] and reports doing well with this. 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 [name] 2/28/202, recommended for annual follow ups    Current Medications:  Current Outpatient Medications on File Prior to Visit  Medication Sig   carboxymethylcellulose (REFRESH TEARS) 0.5 % SOLN Place 1 drop into both eyes 3 (three) times daily as needed (dry eyes).   cetirizine (ZYRTEC) 10 MG tablet Take 1 tablet (10 mg total) by mouth daily. (Patient taking differently: Take 10 mg by mouth See admin instructions. Take 10 mg daily for 1 week then take loratadine 10 mg daily for 1 week, alternating weeks)   Cholecalciferol (VITAMIN D3) 50 MCG (2000 UT) capsule Take 4,000 Units by mouth daily.   lansoprazole (PREVACID) 15 MG capsule Take 15 mg by mouth daily.   lisinopril-hydrochlorothiazide (PRINZIDE,ZESTORETIC) 20-12.5 MG tablet Take 1 tablet by mouth daily.   magnesium oxide (MAG-OX) 400 MG tablet Take 400 mg by mouth daily.   nortriptyline (PAMELOR) 10 MG capsule Take 10 mg by mouth daily as needed (migraines).   tadalafil (CIALIS) 5 MG tablet Take 1 tablet daily for ED and prostate.   vitamin E 1000 UNIT capsule Take 1,000 Units by mouth daily.   Zinc 50 MG TABS Take 50 mg by mouth daily.    No current facility-administered medications on file prior to visit.     Allergies:  Allergies  Allergen Reactions   Ziac [Bisoprolol-Hydrochlorothiazide]     Erectile dysfunction     Medical History:   Past Medical History:  Diagnosis Date   Eye abnormalities    pt states has had twice that the right eye has come out of socket    GERD (gastroesophageal reflux disease)    Hearing loss in right ear    past hx of working with explosives   Hyperlipidemia    Hypertension    Hypogonadism male    Lumbar spinal stenosis 07/31/2014   Pre-diabetes    PTSD (post-traumatic stress disorder)    PTSD (post-traumatic stress disorder)    Sleep apnea    pt does not use CPAP   Tinnitus    Vertigo    Family history- Reviewed and unchanged Social history- Reviewed and unchanged   Review of Systems:  Review of Systems  Constitutional:  Negative for malaise/fatigue and weight loss.  HENT:  Negative for hearing loss and tinnitus.   Eyes:  Negative for blurred vision and double vision.  Respiratory:  Negative for cough, shortness of breath and wheezing.   Cardiovascular:  Negative for chest pain, palpitations, orthopnea, claudication and leg swelling.  Gastrointestinal:  Negative for abdominal pain, blood in stool, constipation, diarrhea, heartburn, melena, nausea and  vomiting.  Genitourinary: Negative.   Musculoskeletal:  Negative for joint pain and myalgias.  Skin:  Negative for rash.  Neurological:  Negative for dizziness, tingling, sensory change, weakness and headaches.  Endo/Heme/Allergies:  Negative for polydipsia.  Psychiatric/Behavioral: Negative.    All other systems reviewed and are negative.  Physical Exam: BP 114/84   Pulse 73   Temp (!) 97.3 F (36.3 C)   Ht 6\' 1"  (1.854 m)   Wt 264 lb (119.7 kg)   SpO2 98%   BMI 34.83 kg/m  Wt Readings from Last 3 Encounters:  11/13/20 264 lb (119.7 kg)  05/14/20 270 lb 9.6 oz (122.7 kg)  03/27/20 272 lb (123.4 kg)   General Appearance: Well nourished, in no apparent distress. Eyes: PERRLA, EOMs, conjunctiva no swelling or erythema Sinuses: No Frontal/maxillary tenderness ENT/Mouth: Ext aud canals clear, TMs without erythema, bulging.  No erythema, swelling, or exudate on post pharynx.  Tonsils not swollen or erythematous. Hearing normal.  Neck: Supple, thyroid normal.  Respiratory: Respiratory effort normal, BS equal bilaterally without rales, rhonchi, wheezing or stridor.  Cardio: RRR with no MRGs. Brisk peripheral pulses without edema.  Abdomen: Soft, + BS.  Non tender, no guarding, rebound, hernias, masses. Lymphatics: Non tender without lymphadenopathy.  Musculoskeletal: Full ROM, 5/5 strength, Normal gait Skin: Warm, dry without rashes, lesions, ecchymosis.  Neuro: Cranial nerves intact. No cerebellar symptoms.  Psych: Awake and oriented X 3, normal affect, Insight and Judgment appropriate.    Izora Ribas, NP 9:50 AM [facility]

## 2020-11-14 LAB — COMPLETE METABOLIC PANEL WITH GFR
AG Ratio: 1.6 (calc) (ref 1.0–2.5)
ALT: 23 U/L (ref 9–46)
AST: 26 U/L (ref 10–35)
Albumin: 4.4 g/dL (ref 3.6–5.1)
Alkaline phosphatase (APISO): 56 U/L (ref 35–144)
BUN: 14 mg/dL (ref 7–25)
CO2: 28 mmol/L (ref 20–32)
Calcium: 9.4 mg/dL (ref 8.6–10.3)
Chloride: 104 mmol/L (ref 98–110)
Creat: 1.1 mg/dL (ref 0.70–1.33)
GFR, Est African American: 89 mL/min/{1.73_m2} (ref >=60)
GFR, Est Non African American: 77 mL/min/{1.73_m2} (ref >=60)
Globulin: 2.7 g/dL (calc) (ref 1.9–3.7)
Glucose, Bld: 97 mg/dL (ref 65–99)
Potassium: 4 mmol/L (ref 3.5–5.3)
Sodium: 140 mmol/L (ref 135–146)
Total Bilirubin: 0.5 mg/dL (ref 0.2–1.2)
Total Protein: 7.1 g/dL (ref 6.1–8.1)

## 2020-11-14 LAB — LIPID PANEL
Cholesterol: 172 mg/dL (ref <200)
HDL: 39 mg/dL — ABNORMAL LOW (ref >=40)
LDL Cholesterol (Calc): 112 mg/dL (calc) — ABNORMAL HIGH
Non-HDL Cholesterol (Calc): 133 mg/dL (calc) — ABNORMAL HIGH (ref <130)
Total CHOL/HDL Ratio: 4.4 (calc) (ref <5.0)
Triglycerides: 100 mg/dL (ref <150)

## 2020-11-14 LAB — CBC WITH DIFFERENTIAL/PLATELET
Absolute Monocytes: 534 cells/uL (ref 200–950)
Basophils Absolute: 29 cells/uL (ref 0–200)
Basophils Relative: 0.5 %
Eosinophils Absolute: 122 cells/uL (ref 15–500)
Eosinophils Relative: 2.1 %
HCT: 43.6 % (ref 38.5–50.0)
Hemoglobin: 14.4 g/dL (ref 13.2–17.1)
Lymphs Abs: 1636 cells/uL (ref 850–3900)
MCH: 26.9 pg — ABNORMAL LOW (ref 27.0–33.0)
MCHC: 33 g/dL (ref 32.0–36.0)
MCV: 81.3 fL (ref 80.0–100.0)
MPV: 11.3 fL (ref 7.5–12.5)
Monocytes Relative: 9.2 %
Neutro Abs: 3480 cells/uL (ref 1500–7800)
Neutrophils Relative %: 60 %
Platelets: 271 10*3/uL (ref 140–400)
RBC: 5.36 10*6/uL (ref 4.20–5.80)
RDW: 15.2 % — ABNORMAL HIGH (ref 11.0–15.0)
Total Lymphocyte: 28.2 %
WBC: 5.8 10*3/uL (ref 3.8–10.8)

## 2020-11-14 LAB — HEMOGLOBIN A1C
Hgb A1c MFr Bld: 5.9 % of total Hgb — ABNORMAL HIGH (ref <5.7)
Mean Plasma Glucose: 123 mg/dL
eAG (mmol/L): 6.8 mmol/L

## 2020-11-14 LAB — TSH: TSH: 1.98 mIU/L (ref 0.40–4.50)

## 2020-11-14 LAB — VITAMIN D 25 HYDROXY (VIT D DEFICIENCY, FRACTURES): Vit D, 25-Hydroxy: 101 ng/mL — ABNORMAL HIGH (ref 30–100)

## 2020-11-14 LAB — MAGNESIUM: Magnesium: 2.1 mg/dL (ref 1.5–2.5)

## 2020-12-15 ENCOUNTER — Encounter (HOSPITAL_BASED_OUTPATIENT_CLINIC_OR_DEPARTMENT_OTHER): Payer: Medicare Other | Admitting: Sleep Medicine

## 2021-03-26 ENCOUNTER — Ambulatory Visit (INDEPENDENT_AMBULATORY_CARE_PROVIDER_SITE_OTHER): Payer: Medicare Other | Admitting: Internal Medicine

## 2021-03-26 ENCOUNTER — Other Ambulatory Visit: Payer: Self-pay

## 2021-03-26 VITALS — BP 130/88 | HR 76 | Temp 97.7°F | Resp 16 | Ht 73.0 in | Wt 268.8 lb

## 2021-03-26 DIAGNOSIS — J0140 Acute pansinusitis, unspecified: Secondary | ICD-10-CM | POA: Diagnosis not present

## 2021-03-26 DIAGNOSIS — J0410 Acute tracheitis without obstruction: Secondary | ICD-10-CM

## 2021-03-26 DIAGNOSIS — G4483 Primary cough headache: Secondary | ICD-10-CM | POA: Diagnosis not present

## 2021-03-26 DIAGNOSIS — H6501 Acute serous otitis media, right ear: Secondary | ICD-10-CM | POA: Diagnosis not present

## 2021-03-26 LAB — POC COVID19 BINAXNOW: SARS Coronavirus 2 Ag: NEGATIVE

## 2021-03-26 MED ORDER — PSEUDOEPHEDRINE HCL ER 120 MG PO TB12: ORAL_TABLET | ORAL | 3 refills | Status: DC

## 2021-03-26 MED ORDER — DEXAMETHASONE 4 MG PO TABS: ORAL_TABLET | ORAL | 0 refills | Status: DC

## 2021-03-26 MED ORDER — AZITHROMYCIN 250 MG PO TABS: ORAL_TABLET | ORAL | 1 refills | Status: DC

## 2021-03-26 NOTE — Progress Notes (Signed)
Future Appointments  Date Time Provider [place]  03/26/2021  4:30 PM Unk Pinto, MD [facility] None  05/14/2021  2:00 PM [name], NP [facility] None    History of Present Illness:     Patient is a very nice 55 yo  MBM with HTN, PreDM, HLD, Testosterone & Vit D Deficiency who presents with Rt ear discomfort, head & chest congestion. No fever, chills , sweats.  Medications    lisinopril-hydrochlorothiazide 20-12.5 MG tablet, Take 1 tablet daily.   tadalafil  5 MG tablet, Take 1 tablet daily for ED and prostate.   cetirizine 10 MG tablet, Take 1 tablet daily.    REFRESH TEARS 0.5 % SOLN, Place 1 drop into both eyes 3  times daily as needed (dry eyes).   VITAMIN D 2000 u, Take 4,000 Units daily.   lansoprazole 15 MG capsule, Take daily.   magnesium  400 MG tablet, Take daily.   nortriptyline 10 MG capsule, Take daily as needed (migraines).   vitamin E 1000 UNIT capsule, Take daily.   Zinc 50 MG TABS, Take daily.   Problem list He has Essential hypertension; Hyperlipidemia; Prediabetes; Vitamin D deficiency; OSA (obstructive sleep apnea); Medication management; GERD; TMJ (temporomandibular joint syndrome); Morbid obesity ([place]) - BMI 30+ with OSA; Meniere disease; Testosterone deficiency; BPH (benign prostatic hyperplasia); History of kidney cancer; and Hepatic steatosis on their problem list.   Observations/Objective:  BP 130/88   Pulse 76   Temp 97.7 F (36.5 C)   Resp 16   Ht 6\' 1"  (1.854 m)   Wt 268 lb 12.8 oz (121.9 kg)   SpO2 98%   BMI 35.46 kg/m   Dry cough. No Stridor.  HEENT - Lt EAC/TM - Nl. Rt EAC clear/Rt TM dull, pink .                    (+) Maxillary tenderness. O/P - clear. Neck - supple.  Chest - Few rales. No rhonchi /wheezes.  Cor - Nl HS. RRR w/o sig MGR. PP 1(+). No edema. MS- FROM w/o deformities.  Gait Nl. Neuro -  Nl w/o focal abnormalities.  Assessment and Plan:   1. Acute non-recurrent pansinusitis  - dexamethasone   4 MG tablet;  Take 1 tab 3 x day - 3 days, then 2 x day - 3 days, then 1 tab daily   Dispense: 20 tablets  - azithromycin 250 MG tablet;  Take 2 tablets with Food on  Day 1, then 1 tablet Daily  Dispense: 60 tablet; Refill: 3  2. Non-recurrent acute serous otitis media of right ear  - azithromycin 250 MG tablet;  Take 2 tablets with Food on  Day 1, then 1 tablet Daily  Dispense: 6 each; Refill: 1  - pseudoephedrine 120 MG 12 hr tablet;  Take  1 tablet  2 x /day   Dispense: 60 tablet; Refill: 3  3. Tracheitis  - dexamethasone 4 MG tablet;  Take 1 tab 3 x day - 3 days, then 2 x day - 3 days, then 1 tab daily   Dispense: 20 tablet;  - azithromycin  250 MG tablet;  Take 2 tablets with Food on  Day 1, then 1 tablet Daily   Dispense: 6 each; Refill: 1  4. Cough headache  - POC COVID-19 -->>  Negative   Follow Up Instructions:        I discussed the assessment and treatment plan with the patient. The patient was provided an  opportunity to ask questions and all were answered. The patient agreed with the plan and demonstrated an understanding of the instructions.       The patient was advised to call back or seek an in-person evaluation if the symptoms worsen or if the condition fails to improve as anticipated.    Kirtland Bouchard, MD

## 2021-05-07 ENCOUNTER — Other Ambulatory Visit: Payer: Self-pay | Admitting: Internal Medicine

## 2021-05-07 MED ORDER — BENZONATATE 200 MG PO CAPS: ORAL_CAPSULE | ORAL | 1 refills | Status: DC

## 2021-05-07 MED ORDER — PROMETHAZINE-DM 6.25-15 MG/5ML PO SYRP: ORAL_SOLUTION | ORAL | 1 refills | Status: DC

## 2021-05-07 MED ORDER — DEXAMETHASONE 4 MG PO TABS: ORAL_TABLET | ORAL | 0 refills | Status: DC

## 2021-05-13 NOTE — Progress Notes (Signed)
Complete Physical  Assessment and Plan:  Diagnoses and all orders for this visit:  Routine general medical examination at a health care facility Due annually  Health Maintenance reviewed Healthy lifestyle reviewed and goals set  Essential hypertension Continue medication Monitor blood pressure at home; call if consistently over 130/80 Continue DASH diet.   Reminder to go to the ER if any CP, SOB, nausea, dizziness, severe HA, changes vision/speech, left arm numbness and tingling and jaw pain.\  OSA (obstructive sleep apnea) Recent hypoglossal nerve stimulator implantation, - followed by Dr. Redmond Baseman, improved per patient, has follow up sleep study planned 2023 Weight loss encouraged  GERD Well managed on current medications -OTC PRN Discussed diet, avoiding triggers and other lifestyle changes  Meniere's disease, unspecified laterality Suspected diagnosis by [facility] ENT/audiology after episodes of decreased hearing, vertigo and syncope. Reportedly currently treated by lifestyle modification; surgery was discussed but declined at this time. No further episodes of syncope.   Hyperlipidemia Currently at goal with lifestyle modification -     Lipid panel -     TSH  Prediabetes Reviewed diet; recommended stop soda/processed carb intake Discussed disease and risks Discussed diet/exercise, weight management  -     Hemoglobin A1c   Vitamin D deficiency Continue supplementation  -     VITAMIN D 25 Hydroxy (Vit-D Deficiency, Fractures)  Medication management -     CBC with Differential/Platelet -     CMP/GFR -     Magnesium   Morbid Obesity - BMI 30+ with OSA Long discussion about weight loss, diet, and exercise Recommended diet heavy in fruits and veggies and low in animal meats, cheeses, and dairy products, appropriate calorie intake Discussed appropriate weight for height Continue to reduce soda/processed carbohydrates Follow up at next visit  History of L kidney cancer/  renal cysts S/p partial nephrectomy, now monitored annually by Dr. [name]  BPH without symptoms Per urology notes; denies symptoms; monitor PSA annually, defer today, reports had by [facility] in 08/2020 and they are planning annual follow up  Erectile dysfunction Doing well with daily cialis;  follow up urology  Orders Placed This Encounter  Procedures   C. trachomatis/N. gonorrhoeae RNA   CBC with Differential/Platelet   COMPLETE METABOLIC PANEL WITH GFR   Magnesium   Lipid panel   TSH   Hemoglobin A1c   VITAMIN D 25 Hydroxy (Vit-D Deficiency, Fractures)   Urinalysis, Routine w reflex microscopic   Microalbumin / creatinine urine ratio   RPR   HIV Antibody (routine testing w rflx)   HSV(herpes simplex vrs) 1+2 ab-IgG   EKG 12-Lead    Discussed med's effects and SE's. Screening labs and tests as requested with regular follow-up as recommended. Over 53 minutes of exam, counseling, chart review and critical decision making was performed  Future Appointments  Date Time Provider [name]  05/19/2022  2:00 PM [name], NP [facility] None     HPI 55 y.o. AA male - . Patient presents for a complete physical. has Essential hypertension; Hyperlipidemia; Prediabetes; Vitamin D deficiency; OSA (obstructive sleep apnea); Medication management; GERD; TMJ (temporomandibular joint syndrome); Morbid obesity ([place]) - BMI 30+ with OSA; Meniere disease; Testosterone deficiency; BPH (benign prostatic hyperplasia); History of kidney cancer; and Hepatic steatosis on their problem list.   Retired early at 64 after working for [employer], divorced with two grown children. Has a steady girlfriend, together 2 years, doesn't use condoms, does request STD testing today though denies sx.   He is being  followed by [facility] for frequent headaches; he takes nortriptyline 10 mg daily, [facility] that [facility].   He is diagnosed with Meniere's  disease, has chronic hearing impairment and is followed as needed by [facility]- currently managed by hydrochlorothiazide, allergy medication, lifestyle.   He has hx of OSA previously with CPAP; struggled due to claustrophobia, felt worse with machine, had a repeat sleep study scheduled through the [place]. On 05/02/2019 underwent hypoglossal nerve stimulator placement by ENT Dr. [name], has done well since then, has follow up sleep study planned next year.   He has GERD well controlled by medication.  BMI is Body mass index is 36.37 kg/m., he has been working on diet and exercise- he reports he walks 5 miles a day as weather allows which also helps his back stiffness significantly. Typically skips breakfast, 2 meals- will have grilled chicken from [employer], etc No ETOH. Soda (down from 10 to 4/week) Avoids processed meats Estimates 52 fluid ounces of water daily  Wt Readings from Last 3 Encounters:  05/14/21 268 lb 3.2 oz (121.7 kg)  03/26/21 268 lb 12.8 oz (121.9 kg)  11/13/20 264 lb (119.7 kg)   He reports well controlled BPs at home, 120/70s, today their BP is BP: 132/88 He does workout. He denies chest pain, shortness of breath, dizziness.   He is not on cholesterol medication and denies myalgias. No family hx of MI or CVA. His cholesterol is not at goal. The cholesterol last visit was:   Lab Results  Component Value Date   CHOL 172 11/13/2020   HDL 39 (L) 11/13/2020   LDLCALC 112 (H) 11/13/2020   TRIG 100 11/13/2020   CHOLHDL 4.4 11/13/2020   He has been working on diet and exercise for prediabetes, he is not on bASA, he is on ACE/ARB and denies increased appetite, nausea, paresthesia of the feet, polydipsia, polyuria, visual disturbances and vomiting. Last A1C in the office was:  Lab Results  Component Value Date   HGBA1C 5.9 (H) 11/13/2020   Last GFR:   Lab Results  Component Value Date   GFRAA 89 11/13/2020   Patient is on Vitamin D supplement, taking ? 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 [name] 08/2020,follows annually, reports had PSA check then. On cialis 5 mg daily for ED and BPH.  Last PSA was: Lab Results  Component Value Date   PSA 1.66 05/14/2020   He has had mild persistent LFT elevation; improved with reducing sodas; [place] 06/20/2019 shows fatty liver;  Lab Results  Component Value Date   ALT 23 11/13/2020   AST 26 11/13/2020   ALKPHOS 52 03/23/2016   BILITOT 0.5 11/13/2020     Current Medications:  Current Outpatient Medications on File Prior to Visit  Medication Sig Dispense Refill   carboxymethylcellulose (REFRESH TEARS) 0.5 % SOLN Place 1 drop into both eyes 3 (three) times daily as needed (dry eyes).     cetirizine (ZYRTEC) 10 MG tablet Take 1 tablet (10 mg total) by mouth daily. (Patient taking differently: Take 10 mg by mouth See admin instructions. Take 10 mg daily for 1 week then take loratadine 10 mg daily for 1 week, alternating weeks) 90 tablet 4   Cholecalciferol (VITAMIN D3) 50 MCG (2000  UT) capsule Take 4,000 Units by mouth daily.     Ginger, Zingiber officinalis, (GINGER PO) Take by mouth.     lansoprazole (PREVACID) 15 MG capsule Take 15 mg by mouth daily.     lisinopril-hydrochlorothiazide (PRINZIDE,ZESTORETIC) 20-12.5 MG tablet Take 1 tablet by mouth daily.     magnesium oxide (MAG-OX) 400 MG tablet Take 400 mg by mouth daily.     nortriptyline (PAMELOR) 10 MG capsule Take 10 mg by mouth daily as needed (migraines).     promethazine-dextromethorphan (PROMETHAZINE-DM) 6.25-15 MG/5ML syrup Take 1 tsp every 4 hours if needed for cough 240 mL 1   tadalafil (CIALIS) 5 MG tablet Take 1 tablet daily for ED and prostate. 90 tablet 1   vitamin E 1000 UNIT capsule Take 1,000 Units by mouth daily.     Zinc 50 MG TABS Take 50 mg by mouth daily.      No current facility-administered  medications on file prior to visit.   Allergies:  Allergies  Allergen Reactions   Ziac [Bisoprolol-Hydrochlorothiazide]     Erectile dysfunction   Health Maintenance:  Immunization History  Administered Date(s) Administered   Influenza-Unspecified 03/24/2018, 03/26/2019, 02/22/2020, 04/08/2021   PFIZER(Purple Top)SARS-COV-2 Vaccination 07/19/2019, 08/09/2019   PPD Test 12/18/2013, 02/11/2015   Pfizer Covid-19 Vaccine Bivalent Booster 54yrs & up 03/05/2020, 12/15/2020, 04/08/2021   Td 05/24/2010   Tetanus: 2012, reports has had since at the [place] Pneumovax: n/a Prevnar 20: n/a Flu vaccine: 03/2021 Shingrix: 2/2, 2021 at the [place] 19: 2/2, 2021, pfizer last booster 03/2021  Colonoscopy: 2018, due 2023, [facility] Hassell Done to get chedule EGD: n/a  Eye Exam: Dr. Lucita Ferrara, hx of cataracts, now going to my eye care at [place], last 2021 Dentist: Dr. Doristine Church, last 2021, q4m  Patient Care Team: Unk Pinto, MD as PCP - General (Internal Medicine)  Medical History:  has Essential hypertension; Hyperlipidemia; Prediabetes; Vitamin D deficiency; OSA (obstructive sleep apnea); Medication management; GERD; TMJ (temporomandibular joint syndrome); Morbid obesity ([place]) - BMI 30+ with OSA; Meniere disease; Testosterone deficiency; BPH (benign prostatic hyperplasia); History of kidney cancer; and Hepatic steatosis on their problem list. Surgical History:  He  has a past surgical history that includes Total hip arthroplasty (Right, 2007); Cataract extraction w/ intraocular lens implant (Left, 2014); Bunionectomy (Bilateral); Lumbar laminectomy/decompression microdiscectomy (Left, 07/31/2014); Cataract extraction (Right); Partial nephrectomy (Left, 2008); Drug induced endoscopy (N/A, 03/02/2019); Eye surgery; and Implantation of hypoglossal nerve stimulator (Right, 05/02/2019). Family History:  His family history includes Breast cancer in his mother and paternal grandmother;  Hypertension in his maternal grandmother. Social History:   reports that he has never smoked. He has never used smokeless tobacco. He reports that he does not drink alcohol and does not use drugs.     Review of Systems:  Review of Systems  Constitutional:  Negative for malaise/fatigue and weight loss.  HENT:  Negative for ear discharge, ear pain, hearing loss (stable, chronic after military) and tinnitus.   Eyes:  Negative for blurred vision and double vision.  Respiratory:  Negative for cough, shortness of breath and wheezing.   Cardiovascular:  Negative for chest pain, palpitations, orthopnea, claudication and leg swelling.  Gastrointestinal:  Negative for abdominal pain, blood in stool, constipation, diarrhea, heartburn, melena, nausea and vomiting.  Genitourinary: Negative.   Musculoskeletal:  Negative for back pain, falls, joint pain and myalgias.  Skin:  Negative for rash.  Neurological:  Positive for dizziness (Intermittent; vertigo related to Meniere's). Negative for tingling, sensory change,  weakness and headaches.  Endo/Heme/Allergies:  Negative for polydipsia.  Psychiatric/Behavioral: Negative.    All other systems reviewed and are negative.  Physical Exam: Estimated body mass index is 36.37 kg/m as calculated from the following:   Height as of this encounter: 6' (1.829 m).   Weight as of this encounter: 268 lb 3.2 oz (121.7 kg). BP 132/88    Pulse 77    Temp (!) 97.5 F (36.4 C)    Ht 6' (1.829 m)    Wt 268 lb 3.2 oz (121.7 kg)    SpO2 97%    BMI 36.37 kg/m  General Appearance: Well nourished, in no apparent distress.  Eyes: PERRLA, EOMs, conjunctiva no swelling or erythema Sinuses: No Frontal/maxillary tenderness  ENT/Mouth: Ext aud canals clear, normal light reflex with TMs without erythema, bulging. Good dentition. No erythema, swelling, or exudate on post pharynx. Tonsils not swollen or erythematous. HOH.  Neck: Supple, thyroid normal. No bruits. Hypoglossal  stimulator palpable in right neck over anterior cervical chain region. Non-tender.  Respiratory: Respiratory effort normal, BS equal bilaterally without rales, rhonchi, wheezing or stridor.  Cardio: RRR without murmurs, rubs or gallops. Brisk peripheral pulses without edema.  Chest: symmetric, with normal excursions and percussion.  Abdomen: Soft, nontender, no guarding, rebound, hernias, masses, or organomegaly.  Lymphatics: Non tender without lymphadenopathy.  Genitourinary: Defer to urology, denies concerns Musculoskeletal: Full ROM all peripheral extremities,5/5 strength, and normal gait.  Skin: Warm, dry without rashes, lesions, ecchymosis. Well healed surgical scars to right lower/lateral chest, upper chest, right submandibular.  Neuro: Cranial nerves intact, reflexes equal bilaterally. Normal muscle tone, no cerebellar symptoms. Sensation intact.  Psych: Awake and oriented X 3, normal affect, Insight and Judgment appropriate.   EKG: WNL no changes   Gorden Harms [place] Catlin 2:34 PM [facility]

## 2021-05-14 ENCOUNTER — Encounter: Payer: Self-pay | Admitting: Adult Health

## 2021-05-14 ENCOUNTER — Other Ambulatory Visit: Payer: Self-pay

## 2021-05-14 ENCOUNTER — Ambulatory Visit (INDEPENDENT_AMBULATORY_CARE_PROVIDER_SITE_OTHER): Payer: Medicare Other | Admitting: Adult Health

## 2021-05-14 VITALS — BP 132/88 | HR 77 | Temp 97.5°F | Ht 72.0 in | Wt 268.2 lb

## 2021-05-14 DIAGNOSIS — E782 Mixed hyperlipidemia: Secondary | ICD-10-CM

## 2021-05-14 DIAGNOSIS — Z0001 Encounter for general adult medical examination with abnormal findings: Secondary | ICD-10-CM

## 2021-05-14 DIAGNOSIS — Z136 Encounter for screening for cardiovascular disorders: Secondary | ICD-10-CM | POA: Diagnosis not present

## 2021-05-14 DIAGNOSIS — Z79899 Other long term (current) drug therapy: Secondary | ICD-10-CM

## 2021-05-14 DIAGNOSIS — Z0000 Encounter for general adult medical examination without abnormal findings: Secondary | ICD-10-CM

## 2021-05-14 DIAGNOSIS — H8109 Meniere's disease, unspecified ear: Secondary | ICD-10-CM

## 2021-05-14 DIAGNOSIS — E349 Endocrine disorder, unspecified: Secondary | ICD-10-CM

## 2021-05-14 DIAGNOSIS — I10 Essential (primary) hypertension: Secondary | ICD-10-CM

## 2021-05-14 DIAGNOSIS — K2100 Gastro-esophageal reflux disease with esophagitis, without bleeding: Secondary | ICD-10-CM

## 2021-05-14 DIAGNOSIS — Z113 Encounter for screening for infections with a predominantly sexual mode of transmission: Secondary | ICD-10-CM

## 2021-05-14 DIAGNOSIS — K760 Fatty (change of) liver, not elsewhere classified: Secondary | ICD-10-CM

## 2021-05-14 DIAGNOSIS — Z1329 Encounter for screening for other suspected endocrine disorder: Secondary | ICD-10-CM

## 2021-05-14 DIAGNOSIS — G4733 Obstructive sleep apnea (adult) (pediatric): Secondary | ICD-10-CM

## 2021-05-14 DIAGNOSIS — Z1389 Encounter for screening for other disorder: Secondary | ICD-10-CM

## 2021-05-14 DIAGNOSIS — R7303 Prediabetes: Secondary | ICD-10-CM

## 2021-05-14 DIAGNOSIS — Z85528 Personal history of other malignant neoplasm of kidney: Secondary | ICD-10-CM

## 2021-05-14 DIAGNOSIS — N400 Enlarged prostate without lower urinary tract symptoms: Secondary | ICD-10-CM

## 2021-05-14 DIAGNOSIS — Z131 Encounter for screening for diabetes mellitus: Secondary | ICD-10-CM

## 2021-05-14 DIAGNOSIS — E559 Vitamin D deficiency, unspecified: Secondary | ICD-10-CM

## 2021-05-14 NOTE — Patient Instructions (Signed)
Craig [name] , Thank you for taking time to come for your Annual Wellness Visit. I appreciate your ongoing commitment to your health goals. Please review the following plan we discussed and let me know if I can assist you in the future.   These are the goals we discussed:  Goals      Blood Pressure < 130/80     DIET - DECREASE SODA OR JUICE INTAKE     DIET - INCREASE WATER INTAKE     65-80+     HEMOGLOBIN A1C < 5.7     Weight (lb) < 245 lb (111.1 kg)        This is a list of the screening recommended for you and due dates:  Health Maintenance  Topic Date Due   Pneumococcal Vaccination (1 - PCV) 05/14/2026*   Colon Cancer Screening  12/08/2021   Tetanus Vaccine  07/20/2024   Flu Shot  Completed   COVID-19 Vaccine  Completed   Hepatitis C Screening: USPSTF Recommendation to screen - Ages 18-79 yo.  Completed   HIV Screening  Completed   Zoster (Shingles) Vaccine  Completed   HPV Vaccine  Aged Out  *Topic was postponed. The date shown is not the original due date.     Know what a healthy weight is for you (roughly BMI <25) and aim to maintain this  Aim for 7+ servings of fruits and vegetables daily  65-80+ fluid ounces of water or unsweet tea for healthy kidneys  Limit to max 1 drink of alcohol per day; avoid smoking/tobacco  Limit animal fats in diet for cholesterol and heart health - choose grass fed whenever available  Avoid highly processed foods, and foods high in saturated/trans fats  Aim for low stress - take time to unwind and care for your mental health  Aim for 150 min of moderate intensity exercise weekly for heart health, and weights twice weekly for bone health  Aim for 7-9 hours of sleep daily     Recommend cutting down on sugar intake  - the American Heart Association recommends no more than 9 teaspoons (38 g) of added sugar for men daily, and 6 teaspoons (25 g) for women. Added sugar can be in many things that you might not expect - salad dressings,  bread that is not home made, "all natural" fruit juice, etc., most processed foods contain hidden sugars. Consider looking at labels and being aware of how much sugar you are consuming in a day. Less is always better for sugar; sugar reduces your body's immune response, and damages blood vessels, leading to increased risk of many diseases.       High-Fiber Eating Plan Fiber, also called dietary fiber, is a type of carbohydrate. It is found foods such as fruits, vegetables, whole grains, and beans. A high-fiber diet can have many health benefits. Your health care provider may recommend a high-fiber diet to help: Prevent constipation. Fiber can make your bowel movements more regular. Lower your cholesterol. Relieve the following conditions: Inflammation of veins in the anus (hemorrhoids). Inflammation of specific areas of the digestive tract (uncomplicated diverticulosis). A problem of the large intestine, also called the colon, that sometimes causes pain and diarrhea (irritable bowel syndrome, or IBS). Prevent overeating as part of a weight-loss plan. Prevent heart disease, type 2 diabetes, and certain cancers. What are tips for following this plan? Reading food labels  Check the nutrition facts label on food products for the amount of dietary fiber. Choose foods that  have 5 grams of fiber or more per serving. The goals for recommended daily fiber intake include: Men (age 50 or younger): 34-38 g. Men (over age 50): 28-34 g. Women (age 71 or younger): 25-28 g. Women (over age 103): 22-25 g. Your daily fiber goal is _____________ g. Shopping Choose whole fruits and vegetables instead of processed forms, such as apple juice or applesauce. Choose a wide variety of high-fiber foods such as avocados, lentils, oats, and kidney beans. Read the nutrition facts label of the foods you choose. Be aware of foods with added fiber. These foods often have high sugar and sodium amounts per  serving. Cooking Use whole-grain flour for baking and cooking. Cook with brown rice instead of white rice. Meal planning Start the day with a breakfast that is high in fiber, such as a cereal that contains 5 g of fiber or more per serving. Eat breads and cereals that are made with whole-grain flour instead of refined flour or white flour. Eat brown rice, bulgur wheat, or millet instead of white rice. Use beans in place of meat in soups, salads, and pasta dishes. Be sure that half of the grains you eat each day are whole grains. General information You can get the recommended daily intake of dietary fiber by: Eating a variety of fruits, vegetables, grains, nuts, and beans. Taking a fiber supplement if you are not able to take in enough fiber in your diet. It is better to get fiber through food than from a supplement. Gradually increase how much fiber you consume. If you increase your intake of dietary fiber too quickly, you may have bloating, cramping, or gas. Drink plenty of water to help you digest fiber. Choose high-fiber snacks, such as berries, raw vegetables, nuts, and popcorn. What foods should I eat? Fruits Berries. Pears. Apples. Oranges. Avocado. Prunes and raisins. Dried figs. Vegetables Sweet potatoes. Spinach. Kale. Artichokes. Cabbage. Broccoli. Cauliflower. Green peas. Carrots. Squash. Grains Whole-grain breads. Multigrain cereal. Oats and oatmeal. Brown rice. Barley. Bulgur wheat. [place]. Quinoa. Bran muffins. Popcorn. Rye wafer crackers. Meats and other proteins Navy beans, kidney beans, and pinto beans. Soybeans. Split peas. Lentils. Nuts and seeds. Dairy Fiber-fortified yogurt. Beverages Fiber-fortified soy milk. Fiber-fortified orange juice. Other foods Fiber bars. The items listed above may not be a complete list of recommended foods and beverages. Contact a dietitian for more information. What foods should I avoid? Fruits Fruit juice. Cooked, strained  fruit. Vegetables Fried potatoes. Canned vegetables. Well-cooked vegetables. Grains White bread. Pasta made with refined flour. White rice. Meats and other proteins Fatty cuts of meat. Fried chicken or fried fish. Dairy Milk. Yogurt. Cream cheese. Sour cream. Fats and oils Butters. Beverages Soft drinks. Other foods Cakes and pastries. The items listed above may not be a complete list of foods and beverages to avoid. Talk with your dietitian about what choices are best for you. Summary Fiber is a type of carbohydrate. It is found in foods such as fruits, vegetables, whole grains, and beans. A high-fiber diet has many benefits. It can help to prevent constipation, lower blood cholesterol, aid weight loss, and reduce your risk of heart disease, diabetes, and certain cancers. Increase your intake of fiber gradually. Increasing fiber too quickly may cause cramping, bloating, and gas. Drink plenty of water while you increase the amount of fiber you consume. The best sources of fiber include whole fruits and vegetables, whole grains, nuts, seeds, and beans. This information is not intended to replace advice given to you by  your health care provider. Make sure you discuss any questions you have with your health care provider. Document Revised: 09/13/2019 Document Reviewed: 09/13/2019 Elsevier Patient Education  2022 [employer].

## 2021-05-15 LAB — HSV(HERPES SIMPLEX VRS) I + II AB-IGG
HAV 1 IGG,TYPE SPECIFIC AB: 1.47 index — ABNORMAL HIGH
HSV 2 IGG,TYPE SPECIFIC AB: 1.58 index — ABNORMAL HIGH

## 2021-05-15 LAB — C. TRACHOMATIS/N. GONORRHOEAE RNA
C. trachomatis RNA, TMA: NOT DETECTED
N. gonorrhoeae RNA, TMA: NOT DETECTED

## 2021-05-15 LAB — URINALYSIS, ROUTINE W REFLEX MICROSCOPIC
Bilirubin Urine: NEGATIVE
Glucose, UA: NEGATIVE
Hgb urine dipstick: NEGATIVE
Ketones, ur: NEGATIVE
Leukocytes,Ua: NEGATIVE
Nitrite: NEGATIVE
Protein, ur: NEGATIVE
Specific Gravity, Urine: 1.02 (ref 1.001–1.035)
pH: 6 (ref 5.0–8.0)

## 2021-05-15 LAB — CBC WITH DIFFERENTIAL/PLATELET
Absolute Monocytes: 723 cells/uL (ref 200–950)
Basophils Absolute: 43 cells/uL (ref 0–200)
Basophils Relative: 0.5 %
Eosinophils Absolute: 187 cells/uL (ref 15–500)
Eosinophils Relative: 2.2 %
HCT: 43.8 % (ref 38.5–50.0)
Hemoglobin: 14.6 g/dL (ref 13.2–17.1)
Lymphs Abs: 2856 cells/uL (ref 850–3900)
MCH: 27.7 pg (ref 27.0–33.0)
MCHC: 33.3 g/dL (ref 32.0–36.0)
MCV: 83.1 fL (ref 80.0–100.0)
MPV: 11.3 fL (ref 7.5–12.5)
Monocytes Relative: 8.5 %
Neutro Abs: 4692 cells/uL (ref 1500–7800)
Neutrophils Relative %: 55.2 %
Platelets: 228 10*3/uL (ref 140–400)
RBC: 5.27 10*6/uL (ref 4.20–5.80)
RDW: 15.3 % — ABNORMAL HIGH (ref 11.0–15.0)
Total Lymphocyte: 33.6 %
WBC: 8.5 10*3/uL (ref 3.8–10.8)

## 2021-05-15 LAB — COMPLETE METABOLIC PANEL WITH GFR
AG Ratio: 1.6 (calc) (ref 1.0–2.5)
ALT: 22 U/L (ref 9–46)
AST: 21 U/L (ref 10–35)
Albumin: 4.2 g/dL (ref 3.6–5.1)
Alkaline phosphatase (APISO): 55 U/L (ref 35–144)
BUN: 19 mg/dL (ref 7–25)
CO2: 26 mmol/L (ref 20–32)
Calcium: 9.3 mg/dL (ref 8.6–10.3)
Chloride: 104 mmol/L (ref 98–110)
Creat: 1.25 mg/dL (ref 0.70–1.30)
Globulin: 2.7 g/dL (calc) (ref 1.9–3.7)
Glucose, Bld: 85 mg/dL (ref 65–99)
Potassium: 3.9 mmol/L (ref 3.5–5.3)
Sodium: 141 mmol/L (ref 135–146)
Total Bilirubin: 0.5 mg/dL (ref 0.2–1.2)
Total Protein: 6.9 g/dL (ref 6.1–8.1)
eGFR: 69 mL/min/{1.73_m2} (ref >=60)

## 2021-05-15 LAB — LIPID PANEL
Cholesterol: 178 mg/dL (ref <200)
HDL: 46 mg/dL (ref >=40)
LDL Cholesterol (Calc): 110 mg/dL (calc) — ABNORMAL HIGH
Non-HDL Cholesterol (Calc): 132 mg/dL (calc) — ABNORMAL HIGH (ref <130)
Total CHOL/HDL Ratio: 3.9 (calc) (ref <5.0)
Triglycerides: 110 mg/dL (ref <150)

## 2021-05-15 LAB — VITAMIN D 25 HYDROXY (VIT D DEFICIENCY, FRACTURES): Vit D, 25-Hydroxy: 91 ng/mL (ref 30–100)

## 2021-05-15 LAB — HEMOGLOBIN A1C
Hgb A1c MFr Bld: 6.1 % of total Hgb — ABNORMAL HIGH (ref <5.7)
Mean Plasma Glucose: 128 mg/dL
eAG (mmol/L): 7.1 mmol/L

## 2021-05-15 LAB — MICROALBUMIN / CREATININE URINE RATIO
Creatinine, Urine: 154 mg/dL (ref 20–320)
Microalb Creat Ratio: 1 mcg/mg creat (ref <30)
Microalb, Ur: 0.2 mg/dL

## 2021-05-15 LAB — HIV ANTIBODY (ROUTINE TESTING W REFLEX): HIV 1&2 Ab, 4th Generation: NONREACTIVE

## 2021-05-15 LAB — TSH: TSH: 1.58 mIU/L (ref 0.40–4.50)

## 2021-05-15 LAB — MAGNESIUM: Magnesium: 2.2 mg/dL (ref 1.5–2.5)

## 2021-05-15 LAB — RPR: RPR Ser Ql: NONREACTIVE

## 2021-05-19 ENCOUNTER — Encounter: Payer: Self-pay | Admitting: Adult Health

## 2021-05-19 DIAGNOSIS — R768 Other specified abnormal immunological findings in serum: Secondary | ICD-10-CM | POA: Insufficient documentation

## 2021-10-12 ENCOUNTER — Encounter: Payer: Self-pay | Admitting: Nurse Practitioner

## 2021-10-12 ENCOUNTER — Ambulatory Visit (INDEPENDENT_AMBULATORY_CARE_PROVIDER_SITE_OTHER): Payer: Medicare Other | Admitting: Nurse Practitioner

## 2021-10-12 VITALS — BP 122/88 | HR 88 | Temp 97.7°F | Wt 274.0 lb

## 2021-10-12 DIAGNOSIS — M62838 Other muscle spasm: Secondary | ICD-10-CM

## 2021-10-12 DIAGNOSIS — M5386 Other specified dorsopathies, lumbar region: Secondary | ICD-10-CM | POA: Diagnosis not present

## 2021-10-12 DIAGNOSIS — G8929 Other chronic pain: Secondary | ICD-10-CM

## 2021-10-12 DIAGNOSIS — M545 Low back pain, unspecified: Secondary | ICD-10-CM

## 2021-10-12 MED ORDER — DEXAMETHASONE 4 MG PO TABS: ORAL_TABLET | ORAL | 0 refills | Status: DC

## 2021-10-12 MED ORDER — CYCLOBENZAPRINE HCL 10 MG PO TABS: ORAL_TABLET | ORAL | 0 refills | Status: DC

## 2021-10-12 NOTE — Progress Notes (Signed)
Assessment and Plan:  Craig [name] was seen today for an episodic visit.  Diagnoses and all order for this visit:  1. Chronic right-sided low back pain without sciatica Rest. Alternate Ice/Heat Stretching Exercises Good Base support when lifting.  - dexamethasone (DECADRON) 4 MG tablet; Take 1 tab 3 x day - 3 days, then 2 x day - 3 days, then 1 tab daily  Dispense: 20 tablet; Refill: 0  2. Limited active range of motion (AROM) of lumbar spine on rotation to left Rest. Alternate Ice/Heat Stretching Exercises Good Base support when lifting.  - dexamethasone (DECADRON) 4 MG tablet; Take 1 tab 3 x day - 3 days, then 2 x day - 3 days, then 1 tab daily  Dispense: 20 tablet; Refill: 0  3. Muscle spasm Rest. Alternate Ice/Heat Stretching Exercises Good Base support when lifting.  - cyclobenzaprine (FLEXERIL) 10 MG tablet; Take 1 tab (10 mg) at bedtime for muscle spasm.  Dispense: 10 tablet; Refill: 0  Notify office for further evaluation and treatment, questions or concerns if s/s fail to improve. The risks and benefits of my recommendations, as well as other treatment options were discussed with the patient today. Questions were answered.  Further disposition pending results of labs. Discussed med's effects and SE's.    Over 15 minutes of exam, counseling, chart review, and critical decision making was performed.   Future Appointments  Date Time Provider [place]  11/26/2021 10:30 AM [name], NP [facility] None  05/19/2022  2:00 PM [name], NP [facility] None    ------------------------------------------------------------------------------------------------------------------   HPI BP 122/88   Pulse 88   Temp 97.7 F (36.5 C)   Wt 274 lb (124.3 kg)   SpO2 98%   BMI 37.16 kg/m    Craig [name] is a 56 y.o. male who presents for evaluation of low back pain. The patient has had recurrent self limited episodes of low back pain in the past and  previous spinal surgery - lumbar laminectomy/decompression 2016 . Symptoms have been present for 3 weeks and are unchanged.  Onset was related to / precipitated by no known injury. The pain is located in the right lumbar area and does not radiate. The pain is described as aching, soreness, throbbing, and tingling and occurs intermittently. 5/10 Symptoms are exacerbated by exercise and twisting. Symptoms are improved by nothing. He has also tried change in body position, rest, and stretching which provided no symptom relief. He has no other symptoms associated with the back pain. The patient has no "red flag" history indicative of complicated back pain.  Denies recent fall, trauma, injury.  Allergies:  Allergies  Allergen Reactions   Ziac [Bisoprolol-Hydrochlorothiazide]     Erectile dysfunction   Topiramate     Other reaction(s): Suicidal thoughts   Medical History:  has Essential hypertension; Hyperlipidemia; Prediabetes; Vitamin D deficiency; OSA (obstructive sleep apnea); Medication management; GERD; TMJ (temporomandibular joint syndrome); Morbid obesity ([place]) - BMI 30+ with OSA; Meniere disease; Testosterone deficiency; BPH (benign prostatic hyperplasia); History of kidney cancer; Hepatic steatosis; and HSV-2 seropositive on their problem list. Surgical History:  He  has a past surgical history that includes Total hip arthroplasty (Right, 2007); Cataract extraction w/ intraocular lens implant (Left, 2014); Bunionectomy (Bilateral); Lumbar laminectomy/decompression microdiscectomy (Left, 07/31/2014); Cataract extraction (Right); Partial nephrectomy (Left, 2008); Drug induced endoscopy (N/A, 03/02/2019); Eye surgery; and Implantation of hypoglossal nerve stimulator (Right, 05/02/2019). Family History:  Hisfamily history includes Breast cancer in his mother and paternal grandmother; Hypertension in  his maternal grandmother. Social History:   reports that he has never smoked. He has never used smokeless  tobacco. He reports that he does not drink alcohol and does not use drugs.  56 y.o.male presents for  Past Medical History:  Diagnosis Date   Eye abnormalities    pt states has had twice that the right eye has come out of socket    GERD (gastroesophageal reflux disease)    Hearing loss in right ear    past hx of working with explosives   Hyperlipidemia    Hypertension    Hypogonadism male    Lumbar spinal stenosis 07/31/2014   Pre-diabetes    PTSD (post-traumatic stress disorder)    PTSD (post-traumatic stress disorder)    Sleep apnea    pt does not use CPAP   Tinnitus    Vertigo      Allergies  Allergen Reactions   Ziac [Bisoprolol-Hydrochlorothiazide]     Erectile dysfunction   Topiramate     Other reaction(s): Suicidal thoughts    Current Outpatient Medications on File Prior to Visit  Medication Sig   carboxymethylcellulose (REFRESH TEARS) 0.5 % SOLN Place 1 drop into both eyes 3 (three) times daily as needed (dry eyes).   cetirizine (ZYRTEC) 10 MG tablet Take 1 tablet (10 mg total) by mouth daily. (Patient taking differently: Take 10 mg by mouth See admin instructions. Take 10 mg daily for 1 week then take loratadine 10 mg daily for 1 week, alternating weeks)   Cholecalciferol (VITAMIN D3) 50 MCG (2000 UT) capsule Take 4,000 Units by mouth daily.   Ginger, Zingiber officinalis, (GINGER PO) Take by mouth.   lansoprazole (PREVACID) 15 MG capsule Take 15 mg by mouth daily.   lisinopril-hydrochlorothiazide (PRINZIDE,ZESTORETIC) 20-12.5 MG tablet Take 1 tablet by mouth daily.   magnesium oxide (MAG-OX) 400 MG tablet Take 400 mg by mouth daily.   nortriptyline (PAMELOR) 10 MG capsule Take 10 mg by mouth daily as needed (migraines).   tadalafil (CIALIS) 5 MG tablet Take 1 tablet daily for ED and prostate.   vitamin E 1000 UNIT capsule Take 1,000 Units by mouth daily.   Zinc 50 MG TABS Take 50 mg by mouth daily.    promethazine-dextromethorphan (PROMETHAZINE-DM) 6.25-15 MG/5ML  syrup Take 1 tsp every 4 hours if needed for cough   No current facility-administered medications on file prior to visit.    ROS: all negative except what is noted in the HPI.   ROS   Physical Exam:  BP 122/88   Pulse 88   Temp 97.7 F (36.5 C)   Wt 274 lb (124.3 kg)   SpO2 98%   BMI 37.16 kg/m   General Appearance: NAD.  Awake, conversant and cooperative. Eyes: PERRLA, EOMs intact.  Sclera white.  Conjunctiva without erythema. Sinuses: No frontal/maxillary tenderness.  No nasal discharge. Nares patent.  ENT/Mouth: Ext aud canals clear.  Bilateral TMs w/DOL and without erythema or bulging. Hearing intact.  Posterior pharynx without swelling or exudate.  Tonsils without swelling or erythema.  Neck: Supple.  No masses, nodules or thyromegaly. Respiratory: Effort is regular with non-labored breathing. Breath sounds are equal bilaterally without rales, rhonchi, wheezing or stridor.  Cardio: RRR with no MRGs. Brisk peripheral pulses without edema.  Abdomen: Active BS in all four quadrants.  Soft and non-tender without guarding, rebound tenderness, hernias or masses. Lymphatics: Non tender without lymphadenopathy.  Musculoskeletal: Full ROM, 5/5 strength, normal ambulation.  No clubbing or cyanosis. Skin: Appropriate color for ethnicity.  Warm without rashes, lesions, ecchymosis, ulcers.  Neuro: CN II-XII grossly normal. Normal muscle tone without cerebellar symptoms and intact sensation.   Psych: AO X 3,  appropriate mood and affect, insight and judgment.     Darrol Jump, NP 4:00 PM [facility]

## 2021-10-12 NOTE — Patient Instructions (Signed)
Acute Back Pain, Adult Acute back pain is sudden and usually short-lived. It is often caused by an injury to the muscles and tissues in the back. The injury may result from: A muscle, tendon, or ligament getting overstretched or torn. Ligaments are tissues that connect bones to each other. Lifting something improperly can cause a back strain. Wear and tear (degeneration) of the spinal disks. Spinal disks are circular tissue that provide cushioning between the bones of the spine (vertebrae). Twisting motions, such as while playing sports or doing yard work. A hit to the back. Arthritis. You may have a physical exam, lab tests, and imaging tests to find the cause of your pain. Acute back pain usually goes away with rest and home care. Follow these instructions at home: Managing pain, stiffness, and swelling Take over-the-counter and prescription medicines only as told by your health care provider. Treatment may include medicines for pain and inflammation that are taken by mouth or applied to the skin, or muscle relaxants. Your health care provider may recommend applying ice during the first 24-48 hours after your pain starts. To do this: Put ice in a plastic bag. Place a towel between your skin and the bag. Leave the ice on for 20 minutes, 2-3 times a day. Remove the ice if your skin turns bright red. This is very important. If you cannot feel pain, heat, or cold, you have a greater risk of damage to the area. If directed, apply heat to the affected area as often as told by your health care provider. Use the heat source that your health care provider recommends, such as a moist heat pack or a heating pad. Place a towel between your skin and the heat source. Leave the heat on for 20-30 minutes. Remove the heat if your skin turns bright red. This is especially important if you are unable to feel pain, heat, or cold. You have a greater risk of getting burned. Activity  Do not stay in bed. Staying in  bed for more than 1-2 days can delay your recovery. Sit up and stand up straight. Avoid leaning forward when you sit or hunching over when you stand. If you work at a desk, sit close to it so you do not need to lean over. Keep your chin tucked in. Keep your neck drawn back, and keep your elbows bent at a 90-degree angle (right angle). Sit high and close to the steering wheel when you drive. Add lower back (lumbar) support to your car seat, if needed. Take short walks on even surfaces as soon as you are able. Try to increase the length of time you walk each day. Do not sit, drive, or stand in one place for more than 30 minutes at a time. Sitting or standing for long periods of time can put stress on your back. Do not drive or use heavy machinery while taking prescription pain medicine. Use proper lifting techniques. When you bend and lift, use positions that put less stress on your back: Bend your knees. Keep the load close to your body. Avoid twisting. Exercise regularly as told by your health care provider. Exercising helps your back heal faster and helps prevent back injuries by keeping muscles strong and flexible. Work with a physical therapist to make a safe exercise program, as recommended by your health care provider. Do any exercises as told by your physical therapist. Lifestyle Maintain a healthy weight. Extra weight puts stress on your back and makes it difficult to have good   posture. Avoid activities or situations that make you feel anxious or stressed. Stress and anxiety increase muscle tension and can make back pain worse. Learn ways to manage anxiety and stress, such as through exercise. General instructions Sleep on a firm mattress in a comfortable position. Try lying on your side with your knees slightly bent. If you lie on your back, put a pillow under your knees. Keep your head and neck in a straight line with your spine (neutral position) when using electronic equipment like  smartphones or pads. To do this: Raise your smartphone or pad to look at it instead of bending your head or neck to look down. Put the smartphone or pad at the level of your face while looking at the screen. Follow your treatment plan as told by your health care provider. This may include: Cognitive or behavioral therapy. Acupuncture or massage therapy. Meditation or yoga. Contact a health care provider if: You have pain that is not relieved with rest or medicine. You have increasing pain going down into your legs or buttocks. Your pain does not improve after 2 weeks. You have pain at night. You lose weight without trying. You have a fever or chills. You develop nausea or vomiting. You develop abdominal pain. Get help right away if: You develop new bowel or bladder control problems. You have unusual weakness or numbness in your arms or legs. You feel faint. These symptoms may represent a serious problem that is an emergency. Do not wait to see if the symptoms will go away. Get medical help right away. Call your local emergency services (911 in the [place] Do not drive yourself to the hospital. Summary Acute back pain is sudden and usually short-lived. Use proper lifting techniques. When you bend and lift, use positions that put less stress on your back. Take over-the-counter and prescription medicines only as told by your health care provider, and apply heat or ice as told. This information is not intended to replace advice given to you by your health care provider. Make sure you discuss any questions you have with your health care provider. Document Revised: 08/01/2020 Document Reviewed: 08/01/2020 Elsevier Patient Education  2023 [employer]  

## 2021-10-14 ENCOUNTER — Other Ambulatory Visit: Payer: Self-pay | Admitting: Nurse Practitioner

## 2021-10-14 ENCOUNTER — Telehealth: Payer: Self-pay | Admitting: Nurse Practitioner

## 2021-10-14 DIAGNOSIS — G8929 Other chronic pain: Secondary | ICD-10-CM

## 2021-10-14 MED ORDER — DICLOFENAC SODIUM 1 % EX GEL: 4.0000 g | Freq: Four times a day (QID) | CUTANEOUS | 2 refills | Status: AC

## 2021-10-14 NOTE — Telephone Encounter (Signed)
Patient states that there was supposed to be a cream for his back sent in but the pharmacy doesn't show a record of it. Please advise.

## 2021-10-21 ENCOUNTER — Telehealth: Payer: Self-pay | Admitting: Nurse Practitioner

## 2021-10-21 NOTE — Telephone Encounter (Signed)
Pt says that he stopped taking the decadron on Saturday because he felt like he was losing his sense of taste and since then has had little to no ability to taste. Wanting to know what he should do and if this is a common side effect or not

## 2021-11-02 NOTE — Telephone Encounter (Signed)
Most likely this is not a SE, he could have had some underlying Covid issues.  Did he test for Covid at all to confirm?  If he has ha Decadron in the past without these symptoms occuring, most likely  it was not due to the mediation, but nonetheless, stopping the medication, I hoped helped.

## 2021-11-12 ENCOUNTER — Ambulatory Visit (INDEPENDENT_AMBULATORY_CARE_PROVIDER_SITE_OTHER): Payer: Medicare Other | Admitting: Nurse Practitioner

## 2021-11-12 ENCOUNTER — Encounter: Payer: Self-pay | Admitting: Nurse Practitioner

## 2021-11-12 VITALS — BP 118/80 | HR 75 | Temp 97.3°F | Wt 272.2 lb

## 2021-11-12 DIAGNOSIS — G4733 Obstructive sleep apnea (adult) (pediatric): Secondary | ICD-10-CM

## 2021-11-12 DIAGNOSIS — K760 Fatty (change of) liver, not elsewhere classified: Secondary | ICD-10-CM

## 2021-11-12 DIAGNOSIS — K2100 Gastro-esophageal reflux disease with esophagitis, without bleeding: Secondary | ICD-10-CM

## 2021-11-12 DIAGNOSIS — Z79899 Other long term (current) drug therapy: Secondary | ICD-10-CM

## 2021-11-12 DIAGNOSIS — E782 Mixed hyperlipidemia: Secondary | ICD-10-CM

## 2021-11-12 DIAGNOSIS — I10 Essential (primary) hypertension: Secondary | ICD-10-CM

## 2021-11-12 DIAGNOSIS — R7303 Prediabetes: Secondary | ICD-10-CM

## 2021-11-12 DIAGNOSIS — M545 Low back pain, unspecified: Secondary | ICD-10-CM

## 2021-11-12 DIAGNOSIS — G8929 Other chronic pain: Secondary | ICD-10-CM

## 2021-11-12 NOTE — Progress Notes (Incomplete)
Ibu and cyclobenzabrine when flared    Assessment and Plan:  Craig [name] was seen today for a general follow up.  Diagnoses and all order for this visit:  1. Essential hypertension Controlled  Continue medications; Continue Zestoretic Discussed DASH (Dietary Approaches to Stop Hypertension) DASH diet is lower in sodium than a typical American diet. Cut back on foods that are high in saturated fat, cholesterol, and trans fats. Eat more whole-grain foods, fish, poultry, and nuts Remain active and exercise as tolerated daily.  Monitor BP at home-Call if greater than 130/80.   - CBC with Differential/Platelet - COMPLETE METABOLIC PANEL WITH GFR  2. OSA (obstructive sleep apnea) 04/2019 Hypoglossal Nerve Stimulator placed. Followed by Dr. Redmond Baseman (surgeon) and Dr. Silva [facility].   Had sleep study 2023, awaiting results.  Continue to monitor  3. Gastroesophageal reflux disease with esophagitis, unspecified whether hemorrhage Controlled. Continue Prevacid. Avoid triggers. Avoid laying down after eating.  4. Hepatic steatosis WNL Continue to monitor   5. Hyperlipidemia Uncontrolled - Elevated LDL. Discussed lifestyle modifications. Recommended diet heavy in fruits and veggies, omega 3's. Decrease consumption of animal meats, cheeses, and dairy products. Remain active and exercise as tolerated. Continue to monitor.  - Lipid panel  6. Prediabetes Education: Reviewed 'ABCs' of diabetes management  A1C (<7):  Goal Met. Blood pressure (<130/80):  Goal Met Cholesterol (LDL <70):  Goal Not Met. Continue Eye Exam yearly  Continue Dental Exam Q6 mo Discussed dietary recommendations Discussed Physical Activity recommendations Foot exam UTD  - Hemoglobin A1c  7. Chronic right-sided low back pain without sciatica Controlled. Discussed IBU 800 mg with Cyclobenzaprine, rest, ICE/Heat alternation when flared. Continue to monitor  8. Medication management All  medications discussed and reviewed in full. All questions and concerns regarding medications addressed.    - CBC with Differential/Platelet - COMPLETE METABOLIC PANEL WITH GFR - Lipid panel - Hemoglobin A1c  Continue to monitor for any increase in fever, chills, N/V, diarrhea, changes to bowel habits, blood in stool.  Notify office for further evaluation and treatment, questions or concerns if s/s fail to improve. The risks and benefits of my recommendations, as well as other treatment options were discussed with the patient today. Questions were answered.  Further disposition pending results of labs. Discussed med's effects and SE's.    Over *** minutes of exam, counseling, chart review, and critical decision making was performed.   Future Appointments  Date Time Provider [place]  11/12/2021 10:30 AM Darrol Jump, NP [facility] None  11/26/2021 10:30 AM [name], NP [facility] None  05/19/2022  2:00 PM Zarriah Starkel, Kenney Houseman, NP [facility] None    ------------------------------------------------------------------------------------------------------------------   HPI There were no vitals taken for this visit. 56 y.o.male presents for  Past Medical History:  Diagnosis Date  . 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 GERD (gastroesophageal reflux disease)   . 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 Hyperlipidemia   . Hypertension   . Hypogonadism male   . Lumbar spinal stenosis 07/31/2014  . Pre-diabetes   . PTSD (post-traumatic stress disorder)   . PTSD (post-traumatic stress disorder)   . Sleep apnea    pt does not use CPAP  . Tinnitus   . Vertigo      Allergies  Allergen Reactions  . Ziac [Bisoprolol-Hydrochlorothiazide]     Erectile dysfunction  . Topiramate  Other reaction(s): Suicidal thoughts    Current Outpatient Medications on File Prior to Visit  Medication Sig  .  carboxymethylcellulose (REFRESH TEARS) 0.5 % SOLN Place 1 drop into both eyes 3 (three) times daily as needed (dry eyes).  . cetirizine (ZYRTEC) 10 MG tablet Take 1 tablet (10 mg total) by mouth daily. (Patient taking differently: Take 10 mg by mouth See admin instructions. Take 10 mg daily for 1 week then take loratadine 10 mg daily for 1 week, alternating weeks)  . Cholecalciferol (VITAMIN D3) 50 MCG (2000 UT) capsule Take 4,000 Units by mouth daily.  . cyclobenzaprine (FLEXERIL) 10 MG tablet Take 1 tab (10 mg) at bedtime for muscle spasm.  Marland Kitchen dexamethasone (DECADRON) 4 MG tablet Take 1 tab 3 x day - 3 days, then 2 x day - 3 days, then 1 tab daily  . diclofenac Sodium (VOLTAREN) 1 % GEL Apply 4 g topically 4 (four) times daily.  . Ginger, Zingiber officinalis, (GINGER PO) Take by mouth.  . lansoprazole (PREVACID) 15 MG capsule Take 15 mg by mouth daily.  Marland Kitchen lisinopril-hydrochlorothiazide (PRINZIDE,ZESTORETIC) 20-12.5 MG tablet Take 1 tablet by mouth daily.  . magnesium oxide (MAG-OX) 400 MG tablet Take 400 mg by mouth daily.  . nortriptyline (PAMELOR) 10 MG capsule Take 10 mg by mouth daily as needed (migraines).  . promethazine-dextromethorphan (PROMETHAZINE-DM) 6.25-15 MG/5ML syrup Take 1 tsp every 4 hours if needed for cough  . tadalafil (CIALIS) 5 MG tablet Take 1 tablet daily for ED and prostate.  . vitamin E 1000 UNIT capsule Take 1,000 Units by mouth daily.  . Zinc 50 MG TABS Take 50 mg by mouth daily.    No current facility-administered medications on file prior to visit.    ROS: all negative except what is noted in the HPI.   ROS   Physical Exam:  There were no vitals taken for this visit.  General Appearance: NAD.  Awake, conversant and cooperative. Eyes: PERRLA, EOMs intact.  Sclera white.  Conjunctiva without erythema. Sinuses: No frontal/maxillary tenderness.  No nasal discharge. Nares patent.  ENT/Mouth: Ext aud canals clear.  Bilateral TMs w/DOL and without erythema or  bulging. Hearing intact.  Posterior pharynx without swelling or exudate.  Tonsils without swelling or erythema.  Neck: Supple.  No masses, nodules or thyromegaly. Respiratory: Effort is regular with non-labored breathing. Breath sounds are equal bilaterally without rales, rhonchi, wheezing or stridor.  Cardio: RRR with no MRGs. Brisk peripheral pulses without edema.  Abdomen: Active BS in all four quadrants.  Soft and non-tender without guarding, rebound tenderness, hernias or masses. Lymphatics: Non tender without lymphadenopathy.  Musculoskeletal: Full ROM, 5/5 strength, normal ambulation.  No clubbing or cyanosis. Skin: Appropriate color for ethnicity. Warm without rashes, lesions, ecchymosis, ulcers.  Neuro: CN II-XII grossly normal. Normal muscle tone without cerebellar symptoms and intact sensation.   Psych: AO X 3,  appropriate mood and affect, insight and judgment.     Darrol Jump, NP 10:12 AM [facility]

## 2021-11-12 NOTE — Patient Instructions (Signed)
Acute Back Pain, Adult Acute back pain is sudden and usually short-lived. It is often caused by an injury to the muscles and tissues in the back. The injury may result from: A muscle, tendon, or ligament getting overstretched or torn. Ligaments are tissues that connect bones to each other. Lifting something improperly can cause a back strain. Wear and tear (degeneration) of the spinal disks. Spinal disks are circular tissue that provide cushioning between the bones of the spine (vertebrae). Twisting motions, such as while playing sports or doing yard work. A hit to the back. Arthritis. You may have a physical exam, lab tests, and imaging tests to find the cause of your pain. Acute back pain usually goes away with rest and home care. Follow these instructions at home: Managing pain, stiffness, and swelling Take over-the-counter and prescription medicines only as told by your health care provider. Treatment may include medicines for pain and inflammation that are taken by mouth or applied to the skin, or muscle relaxants. Your health care provider may recommend applying ice during the first 24-48 hours after your pain starts. To do this: Put ice in a plastic bag. Place a towel between your skin and the bag. Leave the ice on for 20 minutes, 2-3 times a day. Remove the ice if your skin turns bright red. This is very important. If you cannot feel pain, heat, or cold, you have a greater risk of damage to the area. If directed, apply heat to the affected area as often as told by your health care provider. Use the heat source that your health care provider recommends, such as a moist heat pack or a heating pad. Place a towel between your skin and the heat source. Leave the heat on for 20-30 minutes. Remove the heat if your skin turns bright red. This is especially important if you are unable to feel pain, heat, or cold. You have a greater risk of getting burned. Activity  Do not stay in bed. Staying in  bed for more than 1-2 days can delay your recovery. Sit up and stand up straight. Avoid leaning forward when you sit or hunching over when you stand. If you work at a desk, sit close to it so you do not need to lean over. Keep your chin tucked in. Keep your neck drawn back, and keep your elbows bent at a 90-degree angle (right angle). Sit high and close to the steering wheel when you drive. Add lower back (lumbar) support to your car seat, if needed. Take short walks on even surfaces as soon as you are able. Try to increase the length of time you walk each day. Do not sit, drive, or stand in one place for more than 30 minutes at a time. Sitting or standing for long periods of time can put stress on your back. Do not drive or use heavy machinery while taking prescription pain medicine. Use proper lifting techniques. When you bend and lift, use positions that put less stress on your back: Bend your knees. Keep the load close to your body. Avoid twisting. Exercise regularly as told by your health care provider. Exercising helps your back heal faster and helps prevent back injuries by keeping muscles strong and flexible. Work with a physical therapist to make a safe exercise program, as recommended by your health care provider. Do any exercises as told by your physical therapist. Lifestyle Maintain a healthy weight. Extra weight puts stress on your back and makes it difficult to have good   posture. Avoid activities or situations that make you feel anxious or stressed. Stress and anxiety increase muscle tension and can make back pain worse. Learn ways to manage anxiety and stress, such as through exercise. General instructions Sleep on a firm mattress in a comfortable position. Try lying on your side with your knees slightly bent. If you lie on your back, put a pillow under your knees. Keep your head and neck in a straight line with your spine (neutral position) when using electronic equipment like  smartphones or pads. To do this: Raise your smartphone or pad to look at it instead of bending your head or neck to look down. Put the smartphone or pad at the level of your face while looking at the screen. Follow your treatment plan as told by your health care provider. This may include: Cognitive or behavioral therapy. Acupuncture or massage therapy. Meditation or yoga. Contact a health care provider if: You have pain that is not relieved with rest or medicine. You have increasing pain going down into your legs or buttocks. Your pain does not improve after 2 weeks. You have pain at night. You lose weight without trying. You have a fever or chills. You develop nausea or vomiting. You develop abdominal pain. Get help right away if: You develop new bowel or bladder control problems. You have unusual weakness or numbness in your arms or legs. You feel faint. These symptoms may represent a serious problem that is an emergency. Do not wait to see if the symptoms will go away. Get medical help right away. Call your local emergency services (911 in the [place] Do not drive yourself to the hospital. Summary Acute back pain is sudden and usually short-lived. Use proper lifting techniques. When you bend and lift, use positions that put less stress on your back. Take over-the-counter and prescription medicines only as told by your health care provider, and apply heat or ice as told. This information is not intended to replace advice given to you by your health care provider. Make sure you discuss any questions you have with your health care provider. Document Revised: 08/01/2020 Document Reviewed: 08/01/2020 Elsevier Patient Education  2023 [employer]  

## 2021-11-13 LAB — CBC WITH DIFFERENTIAL/PLATELET
Absolute Monocytes: 588 cells/uL (ref 200–950)
Basophils Absolute: 39 cells/uL (ref 0–200)
Basophils Relative: 0.7 %
Eosinophils Absolute: 140 cells/uL (ref 15–500)
Eosinophils Relative: 2.5 %
HCT: 44.6 % (ref 38.5–50.0)
Hemoglobin: 15 g/dL (ref 13.2–17.1)
Lymphs Abs: 1921 cells/uL (ref 850–3900)
MCH: 27.3 pg (ref 27.0–33.0)
MCHC: 33.6 g/dL (ref 32.0–36.0)
MCV: 81.2 fL (ref 80.0–100.0)
MPV: 11.4 fL (ref 7.5–12.5)
Monocytes Relative: 10.5 %
Neutro Abs: 2912 cells/uL (ref 1500–7800)
Neutrophils Relative %: 52 %
Platelets: 335 10*3/uL (ref 140–400)
RBC: 5.49 10*6/uL (ref 4.20–5.80)
RDW: 14.7 % (ref 11.0–15.0)
Total Lymphocyte: 34.3 %
WBC: 5.6 10*3/uL (ref 3.8–10.8)

## 2021-11-13 LAB — COMPLETE METABOLIC PANEL WITH GFR
AG Ratio: 1.7 (calc) (ref 1.0–2.5)
ALT: 28 U/L (ref 9–46)
AST: 26 U/L (ref 10–35)
Albumin: 4.4 g/dL (ref 3.6–5.1)
Alkaline phosphatase (APISO): 55 U/L (ref 35–144)
BUN: 13 mg/dL (ref 7–25)
CO2: 25 mmol/L (ref 20–32)
Calcium: 9.4 mg/dL (ref 8.6–10.3)
Chloride: 103 mmol/L (ref 98–110)
Creat: 1.19 mg/dL (ref 0.70–1.30)
Globulin: 2.6 g/dL (calc) (ref 1.9–3.7)
Glucose, Bld: 94 mg/dL (ref 65–99)
Potassium: 3.9 mmol/L (ref 3.5–5.3)
Sodium: 140 mmol/L (ref 135–146)
Total Bilirubin: 0.4 mg/dL (ref 0.2–1.2)
Total Protein: 7 g/dL (ref 6.1–8.1)
eGFR: 73 mL/min/{1.73_m2} (ref >=60)

## 2021-11-13 LAB — LIPID PANEL
Cholesterol: 180 mg/dL (ref <200)
HDL: 44 mg/dL (ref >=40)
LDL Cholesterol (Calc): 113 mg/dL (calc) — ABNORMAL HIGH
Non-HDL Cholesterol (Calc): 136 mg/dL (calc) — ABNORMAL HIGH (ref <130)
Total CHOL/HDL Ratio: 4.1 (calc) (ref <5.0)
Triglycerides: 120 mg/dL (ref <150)

## 2021-11-13 LAB — HEMOGLOBIN A1C
Hgb A1c MFr Bld: 5.9 % of total Hgb — ABNORMAL HIGH (ref <5.7)
Mean Plasma Glucose: 123 mg/dL
eAG (mmol/L): 6.8 mmol/L

## 2021-11-18 ENCOUNTER — Other Ambulatory Visit: Payer: Self-pay | Admitting: Nurse Practitioner

## 2021-11-18 ENCOUNTER — Telehealth: Payer: Self-pay | Admitting: Nurse Practitioner

## 2021-11-18 DIAGNOSIS — M545 Low back pain, unspecified: Secondary | ICD-10-CM

## 2021-11-18 MED ORDER — TIZANIDINE HCL 4 MG PO TABS: ORAL_TABLET | ORAL | 0 refills | Status: DC

## 2021-11-18 MED ORDER — IBUPROFEN 800 MG PO TABS: 800.0000 mg | ORAL_TABLET | Freq: Three times a day (TID) | ORAL | 0 refills | Status: AC | PRN

## 2021-11-18 NOTE — Telephone Encounter (Signed)
Pt said that last time he came in and saw you, you wanted to give him a medication for his stabbing back pain. He is wanting to try that out please

## 2021-11-26 ENCOUNTER — Ambulatory Visit: Payer: Medicare Other | Admitting: Adult Health

## 2021-12-15 ENCOUNTER — Telehealth: Payer: Self-pay

## 2021-12-15 ENCOUNTER — Other Ambulatory Visit: Payer: Self-pay

## 2021-12-15 DIAGNOSIS — G8929 Other chronic pain: Secondary | ICD-10-CM

## 2021-12-15 MED ORDER — TIZANIDINE HCL 4 MG PO TABS: ORAL_TABLET | ORAL | 0 refills | Status: AC

## 2021-12-15 NOTE — Telephone Encounter (Signed)
Tizanidine refill request. Ok to fill?

## 2021-12-23 ENCOUNTER — Encounter: Payer: Self-pay | Admitting: Internal Medicine

## 2021-12-23 ENCOUNTER — Ambulatory Visit (INDEPENDENT_AMBULATORY_CARE_PROVIDER_SITE_OTHER): Payer: Medicare Other | Admitting: Internal Medicine

## 2021-12-23 VITALS — BP 128/72 | HR 91 | Temp 96.9°F | Resp 16 | Ht 72.0 in | Wt 272.0 lb

## 2021-12-23 DIAGNOSIS — I10 Essential (primary) hypertension: Secondary | ICD-10-CM

## 2021-12-23 MED ORDER — OLMESARTAN MEDOXOMIL 40 MG PO TABS: ORAL_TABLET | ORAL | 0 refills | Status: DC

## 2021-12-23 MED ORDER — OLMESARTAN MEDOXOMIL 40 MG PO TABS: ORAL_TABLET | ORAL | 3 refills | Status: DC

## 2021-12-23 NOTE — Progress Notes (Signed)
Future Appointments  Date Time Provider Department  12/23/2021  4:00 PM Unk Pinto, MD [facility]  02/17/2022 10:30 AM Darrol Jump, NP [facility]  05/19/2022  2:00 PM Darrol Jump, NP [facility]    History of Present Illness:      Patient is a very nice 56 yo  MBM with HTN, PreDM, OSA/CPAP, HLD, Testosterone & Vit D Deficiency , hx/o meniere's Dz , bu now is c/o postural lightheadedness with rapid standing. No vertiginous sx's.   - 0n 05/02/2019 he had implantation of a hypoglossal nerve stimulator by ENT Dr Redmond Baseman fo his central OSA.  - 2014 had sudden decrease in R ear hearing. Audiogram - > Rt. SNHL - 2015 had an episode of Vertigo & R tinnitus x 4 days - 2016 had eval by Junie Bame  at the [facility] in [place]   Medications  Current Outpatient Medications (Endocrine & Metabolic):    dexamethasone (DECADRON) 4 MG tablet, Take 1 tab 3 x day - 3 days, then 2 x day - 3 days, then 1 tab daily (Patient not taking: Reported on 11/12/2021)  Current Outpatient Medications (Cardiovascular):    tadalafil (CIALIS) 5 MG tablet, Take 1 tablet daily for ED and prostate.   olmesartan (BENICAR) 40 MG tablet, Take 1/2 to 1 tablet  at night for BP  Current Outpatient Medications (Respiratory):    cetirizine (ZYRTEC) 10 MG tablet, Take 1 tablet (10 mg total) by mouth daily. (Patient taking differently: Take 10 mg by mouth See admin instructions. Take 10 mg daily for 1 week then take loratadine 10 mg daily for 1 week, alternating weeks)   promethazine-dextromethorphan (PROMETHAZINE-DM) 6.25-15 MG/5ML syrup, Take 1 tsp every 4 hours if needed for cough (Patient not taking: Reported on 11/12/2021)  Current Outpatient Medications (Analgesics):    ibuprofen (IBU) 800 MG tablet, Take 1 tablet (800 mg total) by mouth every 8 (eight) hours as needed.   Current Outpatient Medications (Other):    carboxymethylcellulose (REFRESH TEARS) 0.5 % SOLN, Place 1 drop into both eyes 3  (three) times daily as needed (dry eyes).   Cholecalciferol (VITAMIN D3) 50 MCG (2000 UT) capsule, Take 4,000 Units by mouth daily.   diclofenac Sodium (VOLTAREN) 1 % GEL, Apply 4 g topically 4 (four) times daily.   Ginger, Zingiber officinalis, (GINGER PO), Take by mouth.   lansoprazole (PREVACID) 15 MG capsule, Take 15 mg by mouth daily.   magnesium oxide (MAG-OX) 400 MG tablet, Take 400 mg by mouth daily.   nortriptyline (PAMELOR) 10 MG capsule, Take 10 mg by mouth daily as needed (migraines).   tiZANidine (ZANAFLEX) 4 MG tablet, Take 1 tab (4 mg) QD PRN for treatment of muscle spasms associated with back pain.   vitamin E 1000 UNIT capsule, Take 1,000 Units by mouth daily.   Zinc 50 MG TABS, Take 50 mg by mouth daily.   Problem list He has Essential hypertension; Hyperlipidemia; Prediabetes; Vitamin D deficiency; OSA (obstructive sleep apnea); Medication management; GERD; TMJ (temporomandibular joint syndrome); Morbid obesity ([place]) - BMI 30+ with OSA; Meniere disease; Testosterone deficiency; BPH (benign prostatic hyperplasia); History of kidney cancer; Hepatic steatosis; and HSV-2 seropositive on their problem list.   Observations/Objective:  BP 128/72   Pulse 91   Temp (!) 96.9 F (36.1 C)   Resp 16   Ht 6' (1.829 m)   Wt 272 lb (123.4 kg)   SpO2 98%   BMI 36.89 kg/m   Postural  Sitting      BP 150/84      P 72             &           Standing     BP 128/70     P 84   HEENT - WNL. Neck - supple.  Chest - Clear equal BS. Cor - Nl HS. RRR w/o sig MGR. PP 1(+). No edema. MS- FROM w/o deformities.  Gait Nl. Neuro -  Nl w/o focal abnormalities.   Assessment and Plan:  1. Essential hypertension  2. Postural orthostasis     - suspected due to mild dehydration or volume depletion, So D/C Lisinopril / HCTZ and  sent new Rx for Olmesartan 40 mg to begin 1/2 tab qhs & monitor bid BPs & call results.    Follow Up Instructions:        I discussed the assessment and  treatment plan with the patient. The patient was provided an opportunity to ask questions and all were answered. The patient agreed with the plan and demonstrated an understanding of the instructions.       The patient was advised to call back or seek an in-person evaluation if the symptoms worsen or if the condition fails to improve as anticipated.   Kirtland Bouchard, MD

## 2021-12-23 NOTE — Patient Instructions (Signed)
Hypertension, Adult High blood pressure (hypertension) is when the force of blood pumping through the arteries is too strong. The arteries are the blood vessels that carry blood from the heart throughout the body. Hypertension forces the heart to work harder to pump blood and may cause arteries to become narrow or stiff. Untreated or uncontrolled hypertension can lead to a heart attack, heart failure, a stroke, kidney disease, and other problems. A blood pressure reading consists of a higher number over a lower number. Ideally, your blood pressure should be below 120/80. The first ("top") number is called the systolic pressure. It is a measure of the pressure in your arteries as your heart beats. The second ("bottom") number is called the diastolic pressure. It is a measure of the pressure in your arteries as the heart relaxes. What are the causes? The exact cause of this condition is not known. There are some conditions that result in high blood pressure. What increases the risk? Certain factors may make you more likely to develop high blood pressure. Some of these risk factors are under your control, including: Smoking. Not getting enough exercise or physical activity. Being overweight. Having too much fat, sugar, calories, or salt (sodium) in your diet. Drinking too much alcohol. Other risk factors include: Having a personal history of heart disease, diabetes, high cholesterol, or kidney disease. Stress. Having a family history of high blood pressure and high cholesterol. Having obstructive sleep apnea. Age. The risk increases with age. What are the signs or symptoms? High blood pressure may not cause symptoms. Very high blood pressure (hypertensive crisis) may cause: Headache. Fast or irregular heartbeats (palpitations). Shortness of breath. Nosebleed. Nausea and vomiting. Vision changes. Severe chest pain, dizziness, and seizures. How is this diagnosed? This condition is diagnosed by  measuring your blood pressure while you are seated, with your arm resting on a flat surface, your legs uncrossed, and your feet flat on the floor. The cuff of the blood pressure monitor will be placed directly against the skin of your upper arm at the level of your heart. Blood pressure should be measured at least twice using the same arm. Certain conditions can cause a difference in blood pressure between your right and left arms. If you have a high blood pressure reading during one visit or you have normal blood pressure with other risk factors, you may be asked to: Return on a different day to have your blood pressure checked again. Monitor your blood pressure at home for 1 week or longer. If you are diagnosed with hypertension, you may have other blood or imaging tests to help your health care provider understand your overall risk for other conditions. How is this treated? This condition is treated by making healthy lifestyle changes, such as eating healthy foods, exercising more, and reducing your alcohol intake. You may be referred for counseling on a healthy diet and physical activity. Your health care provider may prescribe medicine if lifestyle changes are not enough to get your blood pressure under control and if: Your systolic blood pressure is above 130. Your diastolic blood pressure is above 80. Your personal target blood pressure may vary depending on your medical conditions, your age, and other factors. Follow these instructions at home: Eating and drinking  Eat a diet that is high in fiber and potassium, and low in sodium, added sugar, and fat. An example of this eating plan is called the DASH diet. DASH stands for Dietary Approaches to Stop Hypertension. To eat this way: Eat   plenty of fresh fruits and vegetables. Try to fill one half of your plate at each meal with fruits and vegetables. Eat whole grains, such as whole-wheat pasta, brown rice, or whole-grain bread. Fill about one  fourth of your plate with whole grains. Eat or drink low-fat dairy products, such as skim milk or low-fat yogurt. Avoid fatty cuts of meat, processed or cured meats, and poultry with skin. Fill about one fourth of your plate with lean proteins, such as fish, chicken without skin, beans, eggs, or tofu. Avoid pre-made and processed foods. These tend to be higher in sodium, added sugar, and fat. Reduce your daily sodium intake. Many people with hypertension should eat less than 1,500 mg of sodium a day. Do not drink alcohol if: Your health care provider tells you not to drink. You are pregnant, may be pregnant, or are planning to become pregnant. If you drink alcohol: Limit how much you have to: 0-1 drink a day for women. 0-2 drinks a day for men. Know how much alcohol is in your drink. In the [place] one drink equals one 12 oz bottle of beer (355 mL), one 5 oz glass of wine (148 mL), or one 1 oz glass of hard liquor (44 mL). Lifestyle  Work with your health care provider to maintain a healthy body weight or to lose weight. Ask what an ideal weight is for you. Get at least 30 minutes of exercise that causes your heart to beat faster (aerobic exercise) most days of the week. Activities may include walking, swimming, or biking. Include exercise to strengthen your muscles (resistance exercise), such as Pilates or lifting weights, as part of your weekly exercise routine. Try to do these types of exercises for 30 minutes at least 3 days a week. Do not use any products that contain nicotine or tobacco. These products include cigarettes, chewing tobacco, and vaping devices, such as e-cigarettes. If you need help quitting, ask your health care provider. Monitor your blood pressure at home as told by your health care provider. Keep all follow-up visits. This is important. Medicines Take over-the-counter and prescription medicines only as told by your health care provider. Follow directions carefully. Blood  pressure medicines must be taken as prescribed. Do not skip doses of blood pressure medicine. Doing this puts you at risk for problems and can make the medicine less effective. Ask your health care provider about side effects or reactions to medicines that you should watch for. Contact a health care provider if you: Think you are having a reaction to a medicine you are taking. Have headaches that keep coming back (recurring). Feel dizzy. Have swelling in your ankles. Have trouble with your vision. Get help right away if you: Develop a severe headache or confusion. Have unusual weakness or numbness. Feel faint. Have severe pain in your chest or abdomen. Vomit repeatedly. Have trouble breathing. These symptoms may be an emergency. Get help right away. Call 911. Do not wait to see if the symptoms will go away. Do not drive yourself to the hospital. Summary Hypertension is when the force of blood pumping through your arteries is too strong. If this condition is not controlled, it may put you at risk for serious complications. Your personal target blood pressure may vary depending on your medical conditions, your age, and other factors. For most people, a normal blood pressure is less than 120/80. Hypertension is treated with lifestyle changes, medicines, or a combination of both. Lifestyle changes include losing weight, eating a healthy,   low-sodium diet, exercising more, and limiting alcohol. This information is not intended to replace advice given to you by your health care provider. Make sure you discuss any questions you have with your health care provider. Document Revised: 03/17/2021 Document Reviewed: 03/17/2021 Elsevier Patient Education  2023 Elsevier Inc.  

## 2022-01-08 ENCOUNTER — Other Ambulatory Visit: Payer: Self-pay | Admitting: Internal Medicine

## 2022-02-17 ENCOUNTER — Encounter: Payer: Self-pay | Admitting: Nurse Practitioner

## 2022-02-17 ENCOUNTER — Ambulatory Visit (INDEPENDENT_AMBULATORY_CARE_PROVIDER_SITE_OTHER): Payer: Medicare Other | Admitting: Nurse Practitioner

## 2022-02-17 VITALS — BP 128/82 | HR 80 | Temp 97.9°F | Ht 72.0 in | Wt 272.0 lb

## 2022-02-17 DIAGNOSIS — M545 Low back pain, unspecified: Secondary | ICD-10-CM

## 2022-02-17 DIAGNOSIS — K2100 Gastro-esophageal reflux disease with esophagitis, without bleeding: Secondary | ICD-10-CM

## 2022-02-17 DIAGNOSIS — I10 Essential (primary) hypertension: Secondary | ICD-10-CM

## 2022-02-17 DIAGNOSIS — K760 Fatty (change of) liver, not elsewhere classified: Secondary | ICD-10-CM

## 2022-02-17 DIAGNOSIS — G4733 Obstructive sleep apnea (adult) (pediatric): Secondary | ICD-10-CM

## 2022-02-17 DIAGNOSIS — Z79899 Other long term (current) drug therapy: Secondary | ICD-10-CM

## 2022-02-17 DIAGNOSIS — R7303 Prediabetes: Secondary | ICD-10-CM

## 2022-02-17 DIAGNOSIS — E782 Mixed hyperlipidemia: Secondary | ICD-10-CM

## 2022-02-17 DIAGNOSIS — G8929 Other chronic pain: Secondary | ICD-10-CM

## 2022-02-17 NOTE — Progress Notes (Signed)
FOLLOW UP  Assessment and Plan:  Craig [name] was seen today for a general follow up.  Diagnoses and all order for this visit:  Essential hypertension Controlled with HCTZ-Lisinopril  Continue medications; Continue Zestoretic Discussed DASH (Dietary Approaches to Stop Hypertension) DASH diet is lower in sodium than a typical American diet. Cut back on foods that are high in saturated fat, cholesterol, and trans fats. Eat more whole-grain foods, fish, poultry, and nuts Remain active and exercise as tolerated daily.  Monitor BP at home-Call if greater than 130/80.   OSA (obstructive sleep apnea) 04/2019 Hypoglossal Nerve Stimulator placed. Followed by Dr. Redmond Baseman (surgeon) and Dr. Silva [facility].   Had sleep study 2023, awaiting results.  Continue to monitor  Gastroesophageal reflux disease with esophagitis, unspecified whether hemorrhage Controlled. Continue Prevacid. Avoid triggers. Avoid laying down after eating.  Hepatic steatosis WNL Continue to monitor   Hyperlipidemia Uncontrolled - Elevated LDL. Discussed lifestyle modifications. Recommended diet heavy in fruits and veggies, omega 3's. Decrease consumption of animal meats, cheeses, and dairy products. Remain active and exercise as tolerated. Continue to monitor.  Prediabetes Education: Reviewed 'ABCs' of diabetes management  A1C (<7):  Goal Met. Blood pressure (<130/80):  Goal Met Cholesterol (LDL <70):  Goal Not Met. Continue Eye Exam yearly  Continue Dental Exam Q6 mo Discussed dietary recommendations Discussed Physical Activity recommendations Foot exam UTD  Chronic right-sided low back pain without sciatica Controlled. Discussed IBU 800 mg with Cyclobenzaprine, rest, ICE/Heat alternation when flared. Continue to monitor  Medication management All medications discussed and reviewed in full. All questions and concerns regarding medications addressed.    Orders Placed This Encounter   Procedures   CBC with Differential/Platelet   COMPLETE METABOLIC PANEL WITH GFR   Lipid panel   Hemoglobin A1c   Urinalysis, Routine w reflex microscopic   Microalbumin / creatinine urine ratio   Notify office for further evaluation and treatment, questions or concerns if s/s fail to improve. The risks and benefits of my recommendations, as well as other treatment options were discussed with the patient today. Questions were answered.  Further disposition pending results of labs. Discussed med's effects and SE's.    Over 20 minutes of exam, counseling, chart review, and critical decision making was performed.   Future Appointments  Date Time Provider [place]  05/19/2022  2:00 PM Craig [name], Craig Houseman, NP [facility] None    ------------------------------------------------------------------------------------------------------------------   HPI BP 128/82   Pulse 80   Temp 97.9 F (36.6 C)   Ht 6' (1.829 m)   Wt 272 lb (123.4 kg)   SpO2 98%   BMI 36.89 kg/m   56 y.o.male presents for general medical follow up.    Reported having BP medication switch but [facility] he is now back on original medication of Lisinopril-HCTZ.  BP well controlled.  He is also followed by the [place].  Currently undergoing assessment via neuromodulator device  for migraine prevention. Has not started using, continues to have on hand.  Follows with Dr. Reece [name] for OSA.  Unable to tolerate CPAP machine due to feelings of claustrophobia.  Had hypoglossal stimulator placed with effective outcome.  Currently working well.   BMI is Body mass index is 36.92 kg/m., he has been working on diet and exercise. Wt Readings from Last 3 Encounters:  11/12/21 272 lb 3.2 oz (123.5 kg)  10/12/21 274 lb (124.3 kg)  05/14/21 268 lb 3.2 oz (121.7 kg)    His blood pressure has been  controlled at home, today their BP is BP: 118/80  He does workout. He denies chest pain, shortness of breath,  dizziness.   He is not on cholesterol medication. His cholesterol is not at goal. The cholesterol last visit was:   Lab Results  Component Value Date   CHOL 180 11/12/2021   HDL 44 11/12/2021   LDLCALC 113 (H) 11/12/2021   TRIG 120 11/12/2021   CHOLHDL 4.1 11/12/2021    He has been working on diet and exercise for prediabetes, and denies polydipsia and polyuria. Last A1C in the office was:  Lab Results  Component Value Date   HGBA1C 5.9 (H) 11/12/2021   Patient is on Vitamin D supplement.   Lab Results  Component Value Date   VD25OH 49 05/14/2021        Past Medical History:  Diagnosis Date   Eye abnormalities    pt states has had twice that the right eye has come out of socket    GERD (gastroesophageal reflux disease)    Hearing loss in right ear    past hx of working with explosives   Hyperlipidemia    Hypertension    Hypogonadism male    Lumbar spinal stenosis 07/31/2014   Pre-diabetes    PTSD (post-traumatic stress disorder)    PTSD (post-traumatic stress disorder)    Sleep apnea    pt does not use CPAP   Tinnitus    Vertigo      Allergies  Allergen Reactions   Ziac [Bisoprolol-Hydrochlorothiazide]     Erectile dysfunction   Topiramate     Other reaction(s): Suicidal thoughts    Current Outpatient Medications on File Prior to Visit  Medication Sig   carboxymethylcellulose (REFRESH TEARS) 0.5 % SOLN Place 1 drop into both eyes 3 (three) times daily as needed (dry eyes).   cetirizine (ZYRTEC) 10 MG tablet Take 1 tablet (10 mg total) by mouth daily. (Patient taking differently: Take 10 mg by mouth See admin instructions. Take 10 mg daily for 1 week then take loratadine 10 mg daily for 1 week, alternating weeks)   Cholecalciferol (VITAMIN D3) 50 MCG (2000 UT) capsule Take 4,000 Units by mouth daily.   diclofenac Sodium (VOLTAREN) 1 % GEL Apply 4 g topically 4 (four) times daily.   Ginger, Zingiber officinalis, (GINGER PO) Take by mouth.   ibuprofen (IBU) 800  MG tablet Take 1 tablet (800 mg total) by mouth every 8 (eight) hours as needed.   lansoprazole (PREVACID) 15 MG capsule Take 15 mg by mouth daily.   lisinopril-hydrochlorothiazide (ZESTORETIC) 20-12.5 MG tablet Take 1 tablet by mouth daily.   magnesium oxide (MAG-OX) 400 MG tablet Take 400 mg by mouth daily.   nortriptyline (PAMELOR) 10 MG capsule Take 10 mg by mouth daily as needed (migraines).   tadalafil (CIALIS) 5 MG tablet Take 1 tablet daily for ED and prostate.   tiZANidine (ZANAFLEX) 4 MG tablet Take 1 tab (4 mg) QD PRN for treatment of muscle spasms associated with back pain.   vitamin E 1000 UNIT capsule Take 1,000 Units by mouth daily.   Zinc 50 MG TABS Take 50 mg by mouth daily.    olmesartan (BENICAR) 40 MG tablet TAKE 1/2 TO 1 TABLET BY MOUTH AT NIGHT FOR BLOOD PRESSURE (Patient not taking: Reported on 02/17/2022)   No current facility-administered medications on file prior to visit.    ROS: all negative except what is noted in the HPI.   Physical Exam:  BP 128/82  Pulse 80   Temp 97.9 F (36.6 C)   Ht 6' (1.829 m)   Wt 272 lb (123.4 kg)   SpO2 98%   BMI 36.89 kg/m   General Appearance: NAD.  Awake, conversant and cooperative. Eyes: PERRLA, EOMs intact.  Sclera white.  Conjunctiva without erythema. Sinuses: No frontal/maxillary tenderness.  No nasal discharge. Nares patent.  ENT/Mouth: Ext aud canals clear.  Bilateral TMs w/DOL and without erythema or bulging. Hearing intact.  Posterior pharynx without swelling or exudate.  Tonsils without swelling or erythema.  Neck: Supple.  No masses, nodules or thyromegaly. Respiratory: Effort is regular with non-labored breathing. Breath sounds are equal bilaterally without rales, rhonchi, wheezing or stridor.  Cardio: RRR with no MRGs. Brisk peripheral pulses without edema.  Abdomen: Active BS in all four quadrants.  Soft and non-tender without guarding, rebound tenderness, hernias or masses. Lymphatics: Non tender without  lymphadenopathy.  Musculoskeletal: Full ROM, 5/5 strength, normal ambulation.  No clubbing or cyanosis. Skin: Appropriate color for ethnicity. Warm without rashes, lesions, ecchymosis, ulcers.  Neuro: CN II-XII grossly normal. Normal muscle tone without cerebellar symptoms and intact sensation.   Psych: AO X 3,  appropriate mood and affect, insight and judgment.     Darrol Jump, NP 11:22 AM [facility]

## 2022-02-17 NOTE — Patient Instructions (Signed)
Dehydration, Adult Dehydration is condition in which there is not enough water or other fluids in the body. This happens when a person loses more fluids than he or she takes in. Important body parts cannot work right without the right amount of fluids. Any loss of fluids from the body can cause dehydration. Dehydration can be mild, worse, or very bad. It should be treated right away to keep it from getting very bad. What are the causes? This condition may be caused by: Conditions that cause loss of water or other fluids, such as: Watery poop (diarrhea). Vomiting. Sweating a lot. Peeing (urinating) a lot. Not drinking enough fluids, especially when you: Are ill. Are doing things that take a lot of energy to do. Other illnesses and conditions, such as fever or infection. Certain medicines, such as medicines that take extra fluid out of the body (diuretics). Lack of safe drinking water. Not being able to get enough water and food. What increases the risk? The following factors may make you more likely to develop this condition: Having a long-term (chronic) illness that has not been treated the right way, such as: Diabetes. Heart disease. Kidney disease. Being 65 years of age or older. Having a disability. Living in a place that is high above the ground or sea (high in altitude). The thinner, dried air causes more fluid loss. Doing exercises that put stress on your body for a long time. What are the signs or symptoms? Symptoms of dehydration depend on how bad it is. Mild or worse dehydration Thirst. Dry lips or dry mouth. Feeling dizzy or light-headed, especially when you stand up from sitting. Muscle cramps. Your body making: Dark pee (urine). Pee may be the color of tea. Less pee than normal. Less tears than normal. Headache. Very bad dehydration Changes in skin. Skin may: Be cold to the touch (clammy). Be blotchy or pale. Not go back to normal right after you lightly pinch  it and let it go. Little or no tears, pee, or sweat. Changes in vital signs, such as: Fast breathing. Low blood pressure. Weak pulse. Pulse that is more than 100 beats a minute when you are sitting still. Other changes, such as: Feeling very thirsty. Eyes that look hollow (sunken). Cold hands and feet. Being mixed up (confused). Being very tired (lethargic) or having trouble waking from sleep. Short-term weight loss. Loss of consciousness. How is this treated? Treatment for this condition depends on how bad it is. Treatment should start right away. Do not wait until your condition gets very bad. Very bad dehydration is an emergency. You will need to go to a hospital. Mild or worse dehydration can be treated at home. You may be asked to: Drink more fluids. Drink an oral rehydration solution (ORS). This drink helps get the right amounts of fluids and salts and minerals in the blood (electrolytes). Very bad dehydration can be treated: With fluids through an IV tube. By getting normal levels of salts and minerals in your blood. This is often done by giving salts and minerals through a tube. The tube is passed through your nose and into your stomach. By treating the root cause. Follow these instructions at home: Oral rehydration solution If told by your doctor, drink an ORS: Make an ORS. Use instructions on the package. Start by drinking small amounts, about  cup (120 mL) every 5-10 minutes. Slowly drink more until you have had the amount that your doctor said to have. Eating and drinking          Drink enough clear fluid to keep your pee pale yellow. If you were told to drink an ORS, finish the ORS first. Then, start slowly drinking other clear fluids. Drink fluids such as: Water. Do not drink only water. Doing that can make the salt (sodium) level in your body get too low. Water from ice chips you suck on. Fruit juice that you have added water to (diluted). Low-calorie sports  drinks. Eat foods that have the right amounts of salts and minerals, such as: Bananas. Oranges. Potatoes. Tomatoes. Spinach. Do not drink alcohol. Avoid: Drinks that have a lot of sugar. These include: High-calorie sports drinks. Fruit juice that you did not add water to. Soda. Caffeine. Foods that are greasy or have a lot of fat or sugar. General instructions Take over-the-counter and prescription medicines only as told by your doctor. Do not take salt tablets. Doing that can make the salt level in your body get too high. Return to your normal activities as told by your doctor. Ask your doctor what activities are safe for you. Keep all follow-up visits as told by your doctor. This is important. Contact a doctor if: You have pain in your belly (abdomen) and the pain: Gets worse. Stays in one place. You have a rash. You have a stiff neck. You get angry or annoyed (irritable) more easily than normal. You are more tired or have a harder time waking than normal. You feel: Weak or dizzy. Very thirsty. Get help right away if you have: Any symptoms of very bad dehydration. Symptoms of vomiting, such as: You cannot eat or drink without vomiting. Your vomiting gets worse or does not go away. Your vomit has blood or green stuff in it. Symptoms that get worse with treatment. A fever. A very bad headache. Problems with peeing or pooping (having a bowel movement), such as: Watery poop that gets worse or does not go away. Blood in your poop (stool). This may cause poop to look black and tarry. Not peeing in 6-8 hours. Peeing only a small amount of very dark pee in 6-8 hours. Trouble breathing. These symptoms may be an emergency. Do not wait to see if the symptoms will go away. Get medical help right away. Call your local emergency services (911 in the [place] Do not drive yourself to the hospital. Summary Dehydration is a condition in which there is not enough water or other fluids  in the body. This happens when a person loses more fluids than he or she takes in. Treatment for this condition depends on how bad it is. Treatment should be started right away. Do not wait until your condition gets very bad. Drink enough clear fluid to keep your pee pale yellow. If you were told to drink an oral rehydration solution (ORS), finish the ORS first. Then, start slowly drinking other clear fluids. Take over-the-counter and prescription medicines only as told by your doctor. Get help right away if you have any symptoms of very bad dehydration. This information is not intended to replace advice given to you by your health care provider. Make sure you discuss any questions you have with your health care provider. Document Revised: 09/16/2021 Document Reviewed: 12/21/2018 Elsevier Patient Education  2023 Elsevier Inc.  

## 2022-02-18 LAB — COMPLETE METABOLIC PANEL WITH GFR
AG Ratio: 1.8 (calc) (ref 1.0–2.5)
ALT: 35 U/L (ref 9–46)
AST: 37 U/L — ABNORMAL HIGH (ref 10–35)
Albumin: 4.6 g/dL (ref 3.6–5.1)
Alkaline phosphatase (APISO): 56 U/L (ref 35–144)
BUN: 14 mg/dL (ref 7–25)
CO2: 27 mmol/L (ref 20–32)
Calcium: 9.3 mg/dL (ref 8.6–10.3)
Chloride: 104 mmol/L (ref 98–110)
Creat: 1.29 mg/dL (ref 0.70–1.30)
Globulin: 2.6 g/dL (calc) (ref 1.9–3.7)
Glucose, Bld: 100 mg/dL — ABNORMAL HIGH (ref 65–99)
Potassium: 3.9 mmol/L (ref 3.5–5.3)
Sodium: 140 mmol/L (ref 135–146)
Total Bilirubin: 0.5 mg/dL (ref 0.2–1.2)
Total Protein: 7.2 g/dL (ref 6.1–8.1)
eGFR: 66 mL/min/{1.73_m2} (ref >=60)

## 2022-02-18 LAB — URINALYSIS, ROUTINE W REFLEX MICROSCOPIC
Bilirubin Urine: NEGATIVE
Glucose, UA: NEGATIVE
Hgb urine dipstick: NEGATIVE
Ketones, ur: NEGATIVE
Leukocytes,Ua: NEGATIVE
Nitrite: NEGATIVE
Protein, ur: NEGATIVE
Specific Gravity, Urine: 1.013 (ref 1.001–1.035)
pH: 6.5 (ref 5.0–8.0)

## 2022-02-18 LAB — CBC WITH DIFFERENTIAL/PLATELET
Absolute Monocytes: 566 cells/uL (ref 200–950)
Basophils Absolute: 41 cells/uL (ref 0–200)
Basophils Relative: 0.7 %
Eosinophils Absolute: 142 cells/uL (ref 15–500)
Eosinophils Relative: 2.4 %
HCT: 44.7 % (ref 38.5–50.0)
Hemoglobin: 14.9 g/dL (ref 13.2–17.1)
Lymphs Abs: 1847 cells/uL (ref 850–3900)
MCH: 27.4 pg (ref 27.0–33.0)
MCHC: 33.3 g/dL (ref 32.0–36.0)
MCV: 82.2 fL (ref 80.0–100.0)
MPV: 11.6 fL (ref 7.5–12.5)
Monocytes Relative: 9.6 %
Neutro Abs: 3304 cells/uL (ref 1500–7800)
Neutrophils Relative %: 56 %
Platelets: 255 10*3/uL (ref 140–400)
RBC: 5.44 10*6/uL (ref 4.20–5.80)
RDW: 14.1 % (ref 11.0–15.0)
Total Lymphocyte: 31.3 %
WBC: 5.9 10*3/uL (ref 3.8–10.8)

## 2022-02-18 LAB — HEMOGLOBIN A1C
Hgb A1c MFr Bld: 5.8 % of total Hgb — ABNORMAL HIGH (ref <5.7)
Mean Plasma Glucose: 120 mg/dL
eAG (mmol/L): 6.6 mmol/L

## 2022-02-18 LAB — MICROALBUMIN / CREATININE URINE RATIO
Creatinine, Urine: 97 mg/dL (ref 20–320)
Microalb, Ur: <0.2 mg/dL

## 2022-02-18 LAB — LIPID PANEL
Cholesterol: 180 mg/dL (ref <200)
HDL: 44 mg/dL (ref >=40)
LDL Cholesterol (Calc): 116 mg/dL (calc) — ABNORMAL HIGH
Non-HDL Cholesterol (Calc): 136 mg/dL (calc) — ABNORMAL HIGH (ref <130)
Total CHOL/HDL Ratio: 4.1 (calc) (ref <5.0)
Triglycerides: 98 mg/dL (ref <150)

## 2022-03-15 ENCOUNTER — Encounter: Payer: Self-pay | Admitting: Internal Medicine

## 2022-03-23 LAB — HM COLONOSCOPY

## 2022-04-07 ENCOUNTER — Encounter: Payer: Self-pay | Admitting: Internal Medicine

## 2022-05-04 ENCOUNTER — Encounter: Payer: Self-pay | Admitting: Internal Medicine

## 2022-05-19 ENCOUNTER — Ambulatory Visit (INDEPENDENT_AMBULATORY_CARE_PROVIDER_SITE_OTHER): Payer: Medicare Other | Admitting: Nurse Practitioner

## 2022-05-19 ENCOUNTER — Encounter: Payer: Self-pay | Admitting: Nurse Practitioner

## 2022-05-19 VITALS — BP 118/84 | HR 71 | Temp 97.3°F | Ht 72.0 in | Wt 268.8 lb

## 2022-05-19 DIAGNOSIS — Z0000 Encounter for general adult medical examination without abnormal findings: Secondary | ICD-10-CM

## 2022-05-19 DIAGNOSIS — N529 Male erectile dysfunction, unspecified: Secondary | ICD-10-CM

## 2022-05-19 DIAGNOSIS — I10 Essential (primary) hypertension: Secondary | ICD-10-CM | POA: Diagnosis not present

## 2022-05-19 DIAGNOSIS — H8109 Meniere's disease, unspecified ear: Secondary | ICD-10-CM

## 2022-05-19 DIAGNOSIS — Z79899 Other long term (current) drug therapy: Secondary | ICD-10-CM

## 2022-05-19 DIAGNOSIS — G4733 Obstructive sleep apnea (adult) (pediatric): Secondary | ICD-10-CM

## 2022-05-19 DIAGNOSIS — Z85528 Personal history of other malignant neoplasm of kidney: Secondary | ICD-10-CM

## 2022-05-19 DIAGNOSIS — Z1329 Encounter for screening for other suspected endocrine disorder: Secondary | ICD-10-CM

## 2022-05-19 DIAGNOSIS — R7303 Prediabetes: Secondary | ICD-10-CM

## 2022-05-19 DIAGNOSIS — Z1389 Encounter for screening for other disorder: Secondary | ICD-10-CM

## 2022-05-19 DIAGNOSIS — Z136 Encounter for screening for cardiovascular disorders: Secondary | ICD-10-CM | POA: Diagnosis not present

## 2022-05-19 DIAGNOSIS — E782 Mixed hyperlipidemia: Secondary | ICD-10-CM

## 2022-05-19 DIAGNOSIS — E559 Vitamin D deficiency, unspecified: Secondary | ICD-10-CM

## 2022-05-19 DIAGNOSIS — G8929 Other chronic pain: Secondary | ICD-10-CM

## 2022-05-19 DIAGNOSIS — K2100 Gastro-esophageal reflux disease with esophagitis, without bleeding: Secondary | ICD-10-CM

## 2022-05-19 DIAGNOSIS — Z131 Encounter for screening for diabetes mellitus: Secondary | ICD-10-CM

## 2022-05-19 DIAGNOSIS — Z0001 Encounter for general adult medical examination with abnormal findings: Secondary | ICD-10-CM

## 2022-05-19 DIAGNOSIS — N400 Enlarged prostate without lower urinary tract symptoms: Secondary | ICD-10-CM

## 2022-05-19 NOTE — Progress Notes (Signed)
Complete Physical  Assessment and Plan:  Diagnoses and all orders for this visit:  Routine general medical examination at a health care facility Due annually  Health Maintenance reviewed Healthy lifestyle reviewed and goals set  Essential hypertension Continue medication Monitor blood pressure at home; call if consistently over 130/80 Continue DASH diet.   Reminder to go to the ER if any CP, SOB, nausea, dizziness, severe HA, changes vision/speech, left arm numbness and tingling and jaw pain._0  OSA (obstructive sleep apnea) Recent hypoglossal nerve stimulator implantation, - followed by Dr. Redmond Baseman, improved per patient   GERD No suspected reflux complications ([place]/stricture). Lifestyle modification:  wt loss, avoid meals 2-3h before bedtime. Consider eliminating food triggers:  chocolate, caffeine, EtOH, acid/spicy food.   Meniere's disease, unspecified laterality Suspected diagnosis by [facility] ENT/audiology after episodes of decreased hearing, vertigo and syncope. Reportedly currently treated by lifestyle modification; surgery was discussed but declined at this time. No further episodes of syncope.   Hyperlipidemia Discussed lifestyle modifications. Recommended diet heavy in fruits and veggies, omega 3's. Decrease consumption of animal meats, cheeses, and dairy products. Remain active and exercise as tolerated. Continue to monitor. Check lipids/TSH  Prediabetes Education: Reviewed 'ABCs' of diabetes management  Discussed goals to be met and/or maintained include A1C (<7) Blood pressure (<130/80) Cholesterol (LDL <70) Continue Eye Exam yearly  Continue Dental Exam Q6 mo Discussed dietary recommendations Discussed Physical Activity recommendations Check A1C  Vitamin D deficiency Continue supplementation  Check and monitor levels  Medication management All medications discussed and reviewed in full. All questions and concerns regarding medications addressed.      Morbid Obesity - BMI 30+ with OSA Discussed appropriate BMI Diet modification. Physical activity. Encouraged/praised to build confidence.   History of L kidney cancer/ renal cysts S/p partial nephrectomy, now monitored annually by Dr. [name]  BPH without symptoms Per urology notes; denies symptoms; monitor Monitor PSA  Erectile dysfunction Doing well with daily cialis;  follow up urology  Headache Managed Stay well hydrated  Screening for cardiovascular condition -EKG  Screening for hematuria and proteinuria -UA/Microalbumin  Screening for thyroid disorder -TSH  Screening for DM -A1c  Medication management All medications discussed and reviewed in full. All questions and concerns regarding medications addressed.    Orders Placed This Encounter  Procedures   CBC with Differential/Platelet   COMPLETE METABOLIC PANEL WITH GFR   Magnesium   Lipid panel   TSH   Hemoglobin A1c   Insulin, random   VITAMIN D 25 Hydroxy (Vit-D Deficiency, Fractures)   Urinalysis, Routine w reflex microscopic   Microalbumin / creatinine urine ratio   PSA   EKG 12-Lead    Notify office for further evaluation and treatment, questions or concerns if any reported s/s fail to improve.   The patient was advised to call back or seek an in-person evaluation if any symptoms worsen or if the condition fails to improve as anticipated.   Further disposition pending results of labs. Discussed med's effects and SE's.    I discussed the assessment and treatment plan with the patient. The patient was provided an opportunity to ask questions and all were answered. The patient agreed with the plan and demonstrated an understanding of the instructions.  Discussed med's effects and SE's. Screening labs and tests as requested with regular follow-up as recommended.  I provided 40 minutes of face-to-face time during this encounter including counseling, chart review, and critical decision making  was preformed.   Future Appointments  Date Time Provider [place]  11/23/2022 10:30 AM Unk Pinto, MD [facility] None  05/23/2023  2:00 PM Darrol Jump, NP [facility] None     HPI 56 y.o. AA male - . Patient presents for a complete physical. has Essential hypertension; Hyperlipidemia; Prediabetes; Vitamin D deficiency; OSA (obstructive sleep apnea); Medication management; GERD; TMJ (temporomandibular joint syndrome); Morbid obesity ([place]) - BMI 30+ with OSA; Meniere disease; Testosterone deficiency; BPH (benign prostatic hyperplasia); History of kidney cancer; Hepatic steatosis; and HSV-2 seropositive on their problem list.   Overall he reports feeling well today.  He has no new or additional concerns.  Follows with our clinic every 3 months.   Retired early at 54 after working for [employer], divorced with two grown children. Has a steady girlfriend.  He is being followed by the [facility] for frequent headaches; he takes nortriptyline 10 mg daily, that he reports is effective.   He is diagnosed with Meniere's disease, has chronic hearing impairment and is followed as needed by [facility]- currently managed by hydrochlorothiazide, allergy medication, lifestyle.   He has hx of OSA previously with CPAP; struggled due to claustrophobia, felt worse with machine, had a repeat sleep study scheduled through the [place]. On 05/02/2019 underwent hypoglossal nerve stimulator placement by ENT Dr. [name], has done well since then, has follow up sleep study planned next year.   He has GERD well controlled by medication.  BMI is Body mass index is 36.46 kg/m., he has been working on diet and exercise- he reports he walks 5 miles a day as weather allows which also helps his back stiffness significantly.  Works out in gym 3 days a week. Wt Readings from Last 3 Encounters:  05/19/22 268 lb 12.8 oz (121.9 kg)  02/17/22 272 lb (123.4 kg)  12/23/21 272 lb (123.4 kg)   He reports well controlled  BPs at home, 120/70s, today their BP is BP: 118/84 He does workout. He denies chest pain, shortness of breath, dizziness.   He is not on cholesterol medication and denies myalgias. No family hx of MI or CVA. His cholesterol is not at goal. The cholesterol last visit was:   Lab Results  Component Value Date   CHOL 180 02/17/2022   HDL 44 02/17/2022   LDLCALC 116 (H) 02/17/2022   TRIG 98 02/17/2022   CHOLHDL 4.1 02/17/2022   He has been working on diet and exercise for prediabetes, he is not on bASA, he is on ACE/ARB and denies increased appetite, nausea, paresthesia of the feet, polydipsia, polyuria, visual disturbances and vomiting. Last A1C in the office was:  Lab Results  Component Value Date   HGBA1C 5.8 (H) 02/17/2022   Last GFR:   Lab Results  Component Value Date   GFRAA 89 11/13/2020   Patient is on Vitamin D supplement, taking ? 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 [name] ,follows annually, reports had PSA check then. On cialis 5 mg daily for ED and BPH.  Last PSA was: Lab Results  Component Value Date   PSA 1.66 05/14/2020   He has had mild persistent LFT elevation; improved with reducing sodas; [place] 06/20/2019 shows fatty liver;  Lab Results  Component Value Date   ALT 35 02/17/2022   AST 37 (H) 02/17/2022   ALKPHOS 52 03/23/2016   BILITOT 0.5 02/17/2022  Current Medications:  Current Outpatient Medications on File Prior to Visit  Medication Sig Dispense Refill   Ascorbic Acid (VITAMIN C) 1000 MG tablet Take 1,000 mg by mouth daily.     carboxymethylcellulose (REFRESH TEARS) 0.5 % SOLN Place 1 drop into both eyes 3 (three) times daily as needed (dry eyes).     cetirizine (ZYRTEC) 10 MG tablet Take 1 tablet (10 mg total) by mouth daily. (Patient taking differently: Take 10 mg by mouth See admin instructions.  Take 10 mg daily for 1 week then take loratadine 10 mg daily for 1 week, alternating weeks) 90 tablet 4   Cholecalciferol (VITAMIN D3) 50 MCG (2000 UT) capsule Take 4,000 Units by mouth daily.     diclofenac Sodium (VOLTAREN) 1 % GEL Apply 4 g topically 4 (four) times daily. 4 g 2   Ginger, Zingiber officinalis, (GINGER PO) Take by mouth.     ibuprofen (IBU) 800 MG tablet Take 1 tablet (800 mg total) by mouth every 8 (eight) hours as needed. 30 tablet 0   lansoprazole (PREVACID) 15 MG capsule Take 15 mg by mouth daily.     lisinopril-hydrochlorothiazide (ZESTORETIC) 20-12.5 MG tablet Take 1 tablet by mouth daily.     magnesium oxide (MAG-OX) 400 MG tablet Take 400 mg by mouth daily.     nortriptyline (PAMELOR) 10 MG capsule Take 10 mg by mouth daily as needed (migraines).     Red Yeast Rice Extract (RED YEAST RICE PO) Take by mouth daily.     tadalafil (CIALIS) 5 MG tablet Take 1 tablet daily for ED and prostate. 90 tablet 1   tiZANidine (ZANAFLEX) 4 MG tablet Take 1 tab (4 mg) QD PRN for treatment of muscle spasms associated with back pain. 30 tablet 0   vitamin E 1000 UNIT capsule Take 1,000 Units by mouth daily.     Zinc 50 MG TABS Take 50 mg by mouth daily.      olmesartan (BENICAR) 40 MG tablet TAKE 1/2 TO 1 TABLET BY MOUTH AT NIGHT FOR BLOOD PRESSURE (Patient not taking: Reported on 02/17/2022) 30 tablet 3   No current facility-administered medications on file prior to visit.   Allergies:  Allergies  Allergen Reactions   Ziac [Bisoprolol-Hydrochlorothiazide]     Erectile dysfunction   Topiramate     Other reaction(s): Suicidal thoughts   Health Maintenance:  Immunization History  Administered Date(s) Administered   Influenza,inj,Quad PF,6+ Mos 02/20/2022   Influenza-Unspecified 03/24/2018, 03/26/2019, 02/22/2020, 04/08/2021   PFIZER Comirnaty(Gray Top)Covid-19 Tri-Sucrose Vaccine 12/15/2020   PFIZER(Purple Top)SARS-COV-2 Vaccination 07/19/2019, 08/09/2019   PPD Test 12/18/2013,  02/11/2015   Pfizer Covid-19 Vaccine Bivalent Booster 92yr & up 03/05/2020, 12/15/2020, 04/08/2021   Td 05/24/2010   Tdap 07/20/2014   Zoster Recombinat (Shingrix) 04/12/2019, 03/10/2020   [place] 2012[place]: [place] Prevnar 20: n/a Flu vaccine: 02/2022 Shingrix: 2/2, [place] [facility]: [facility] 2021, pfizer last booster 03/2021  Colon[facility]: [facility] FHassell Doneto get chedule EGD: n/a  Eye Exam: Dr. SLucita Ferrara hx of cataracts, [place] Dentist: Dr. PDoristine Church last 2023, [id number] Care Team: McUnk PintoMD as PCP - General (Internal Medicine)  Medical History:  has Essential hypertension; Hyperlipidemia; Prediabetes; Vitamin D deficiency; OSA (obstructive sleep apnea); Medication management; GERD; TMJ (temporomandibular joint syndrome); [place]bid [place] ([place]- BMI 30+ with OSA; Meniere disease; Testosterone deficiency; BPH (benign prostatic hyperplasia); History of  kidney cancer; Hepatic steatosis; and HSV-2 seropositive on their problem list. Surgical History:  He  has a past surgical history that includes Total hip arthroplasty (Right, 2007); Cataract extraction w/ intraocular lens implant (Left, 2014); Bunionectomy (Bilateral); Lumbar laminectomy/decompression microdiscectomy (Left, 07/31/2014); Cataract extraction (Right); Partial nephrectomy (Left, 2008); Drug induced endoscopy (N/A, 03/02/2019); Eye surgery; and Implantation of hypoglossal nerve stimulator (Right, 05/02/2019). Family History:  His family history includes Breast cancer in his mother and paternal grandmother; Hypertension in his maternal grandmother. Social History:   reports that he has never smoked. He has never used smokeless tobacco. He reports that he does not drink alcohol and does not use drugs.     Review of Systems:  Review of Systems  Constitutional:  Negative for malaise/fatigue and weight loss.  HENT:   Negative for ear discharge, ear pain, hearing loss (stable, chronic after military) and tinnitus.   Eyes:  Negative for blurred vision and double vision.  Respiratory:  Negative for cough, shortness of breath and wheezing.   Cardiovascular:  Negative for chest pain, palpitations, orthopnea, claudication and leg swelling.  Gastrointestinal:  Negative for abdominal pain, blood in stool, constipation, diarrhea, heartburn, melena, nausea and vomiting.  Genitourinary: Negative.   Musculoskeletal:  Negative for back pain, falls, joint pain and myalgias.  Skin:  Negative for rash.  Neurological:  Positive for dizziness (Intermittent; vertigo related to Meniere's). Negative for tingling, sensory change, weakness and headaches.  Endo/Heme/Allergies:  Negative for polydipsia.  Psychiatric/Behavioral: Negative.    All other systems reviewed and are negative.   Physical Exam: Estimated body mass index is 36.46 kg/m as calculated from the following:   Height as of this encounter: 6' (1.829 m).   Weight as of this encounter: 268 lb 12.8 oz (121.9 kg). BP 118/84   Pulse 71   Temp (!) 97.3 F (36.3 C)   Ht 6' (1.829 m)   Wt 268 lb 12.8 oz (121.9 kg)   SpO2 96%   BMI 36.46 kg/m  General Appearance: Well nourished, in no apparent distress.  Eyes: PERRLA, EOMs, conjunctiva no swelling or erythema Sinuses: No Frontal/maxillary tenderness  ENT/Mouth: Ext aud canals clear, normal light reflex with TMs without erythema, bulging. Good dentition. No erythema, swelling, or exudate on post pharynx. Tonsils not swollen or erythematous. HOH.  Neck: Supple, thyroid normal. No bruits. Hypoglossal stimulator palpable in right neck over anterior cervical chain region. Non-tender.  Respiratory: Respiratory effort normal, BS equal bilaterally without rales, rhonchi, wheezing or stridor.  Cardio: RRR without murmurs, rubs or gallops. Brisk peripheral pulses without edema.  Chest: symmetric, with normal excursions  and percussion.  Abdomen: Soft, nontender, no guarding, rebound, hernias, masses, or organomegaly.  Lymphatics: Non tender without lymphadenopathy.  Genitourinary: Defer to urology, denies concerns Musculoskeletal: Full ROM all peripheral extremities,5/5 strength, and normal gait.  Skin: Warm, dry without rashes, lesions, ecchymosis. Well healed surgical scars to right lower/lateral chest, upper chest, right submandibular.  Neuro: Cranial nerves intact, reflexes equal bilaterally. Normal muscle tone, no cerebellar symptoms. Sensation intact.  Psych: Awake and oriented X 3, normal affect, Insight and Judgment appropriate.   EKG: WNL no changes   TONYA CRANFORD 4:20 PM [facility]

## 2022-05-19 NOTE — Patient Instructions (Signed)

## 2022-05-20 LAB — CBC WITH DIFFERENTIAL/PLATELET
Absolute Monocytes: 460 cells/uL (ref 200–950)
Basophils Absolute: 32 cells/uL (ref 0–200)
Basophils Relative: 0.5 %
Eosinophils Absolute: 101 cells/uL (ref 15–500)
Eosinophils Relative: 1.6 %
HCT: 44.3 % (ref 38.5–50.0)
Hemoglobin: 14.6 g/dL (ref 13.2–17.1)
Lymphs Abs: 2205 cells/uL (ref 850–3900)
MCH: 26.8 pg — ABNORMAL LOW (ref 27.0–33.0)
MCHC: 33 g/dL (ref 32.0–36.0)
MCV: 81.4 fL (ref 80.0–100.0)
MPV: 11.7 fL (ref 7.5–12.5)
Monocytes Relative: 7.3 %
Neutro Abs: 3503 cells/uL (ref 1500–7800)
Neutrophils Relative %: 55.6 %
Platelets: 262 10*3/uL (ref 140–400)
RBC: 5.44 10*6/uL (ref 4.20–5.80)
RDW: 14.5 % (ref 11.0–15.0)
Total Lymphocyte: 35 %
WBC: 6.3 10*3/uL (ref 3.8–10.8)

## 2022-05-20 LAB — COMPLETE METABOLIC PANEL WITH GFR
AG Ratio: 1.6 (calc) (ref 1.0–2.5)
ALT: 28 U/L (ref 9–46)
AST: 26 U/L (ref 10–35)
Albumin: 4.6 g/dL (ref 3.6–5.1)
Alkaline phosphatase (APISO): 53 U/L (ref 35–144)
BUN: 14 mg/dL (ref 7–25)
CO2: 30 mmol/L (ref 20–32)
Calcium: 9.6 mg/dL (ref 8.6–10.3)
Chloride: 102 mmol/L (ref 98–110)
Creat: 1.2 mg/dL (ref 0.70–1.30)
Globulin: 2.8 g/dL (calc) (ref 1.9–3.7)
Glucose, Bld: 83 mg/dL (ref 65–99)
Potassium: 3.8 mmol/L (ref 3.5–5.3)
Sodium: 141 mmol/L (ref 135–146)
Total Bilirubin: 0.6 mg/dL (ref 0.2–1.2)
Total Protein: 7.4 g/dL (ref 6.1–8.1)
eGFR: 72 mL/min/{1.73_m2} (ref >=60)

## 2022-05-20 LAB — LIPID PANEL
Cholesterol: 167 mg/dL (ref <200)
HDL: 47 mg/dL (ref >=40)
LDL Cholesterol (Calc): 100 mg/dL (calc) — ABNORMAL HIGH
Non-HDL Cholesterol (Calc): 120 mg/dL (calc) (ref <130)
Total CHOL/HDL Ratio: 3.6 (calc) (ref <5.0)
Triglycerides: 106 mg/dL (ref <150)

## 2022-05-20 LAB — URINALYSIS, ROUTINE W REFLEX MICROSCOPIC
Bilirubin Urine: NEGATIVE
Glucose, UA: NEGATIVE
Hgb urine dipstick: NEGATIVE
Ketones, ur: NEGATIVE
Leukocytes,Ua: NEGATIVE
Nitrite: NEGATIVE
Protein, ur: NEGATIVE
Specific Gravity, Urine: 1.017 (ref 1.001–1.035)
pH: 6.5 (ref 5.0–8.0)

## 2022-05-20 LAB — MICROALBUMIN / CREATININE URINE RATIO
Creatinine, Urine: 119 mg/dL (ref 20–320)
Microalb Creat Ratio: 2 mcg/mg creat (ref <30)
Microalb, Ur: 0.2 mg/dL

## 2022-05-20 LAB — HEMOGLOBIN A1C
Hgb A1c MFr Bld: 6.1 % of total Hgb — ABNORMAL HIGH (ref <5.7)
Mean Plasma Glucose: 128 mg/dL
eAG (mmol/L): 7.1 mmol/L

## 2022-05-20 LAB — PSA: PSA: 1.13 ng/mL (ref <=4.00)

## 2022-05-20 LAB — INSULIN, RANDOM: Insulin: 17.2 u[IU]/mL

## 2022-05-20 LAB — TSH: TSH: 1.82 mIU/L (ref 0.40–4.50)

## 2022-05-20 LAB — MAGNESIUM: Magnesium: 2 mg/dL (ref 1.5–2.5)

## 2022-05-20 LAB — VITAMIN D 25 HYDROXY (VIT D DEFICIENCY, FRACTURES): Vit D, 25-Hydroxy: 79 ng/mL (ref 30–100)

## 2022-05-20 NOTE — Progress Notes (Signed)
Patient is aware of lab results and instructions. -e welch

## 2022-07-27 ENCOUNTER — Ambulatory Visit (INDEPENDENT_AMBULATORY_CARE_PROVIDER_SITE_OTHER): Payer: Medicare Other

## 2022-07-27 ENCOUNTER — Encounter: Payer: Self-pay | Admitting: Physician Assistant

## 2022-07-27 ENCOUNTER — Ambulatory Visit (INDEPENDENT_AMBULATORY_CARE_PROVIDER_SITE_OTHER): Payer: Medicare Other | Admitting: Physician Assistant

## 2022-07-27 DIAGNOSIS — M25512 Pain in left shoulder: Secondary | ICD-10-CM

## 2022-07-27 MED ORDER — BUPIVACAINE HCL 0.25 % IJ SOLN
2.0000 mL | INTRAMUSCULAR | Status: AC | PRN
  Administered 2022-07-27: 2 mL via INTRA_ARTICULAR

## 2022-07-27 MED ORDER — METHYLPREDNISOLONE ACETATE 40 MG/ML IJ SUSP
80.0000 mg | INTRAMUSCULAR | Status: AC | PRN
  Administered 2022-07-27: 80 mg via INTRA_ARTICULAR

## 2022-07-27 MED ORDER — LIDOCAINE HCL 1 % IJ SOLN
2.0000 mL | INTRAMUSCULAR | Status: AC | PRN
  Administered 2022-07-27: 2 mL

## 2022-07-27 NOTE — Progress Notes (Signed)
Office Visit Note   Patient: Craig [name]           Date of Birth: September 08, 1965           MRN: [id number] Visit Date: 07/27/2022              Requested by: Unk Pinto, [address] [place],  [place] [address] PCP: Unk Pinto, MD   Assessment & Plan: Visit Diagnoses:  1. 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  He said he has had this on and off in the past and he spends a lot of time working out.  He said in the past he has been given a bursa injection and it seems to have helped him quite a bit.  Examination was finding consistent with impingement syndrome.  I offered him an injection today.  He like to try this.  If he does not get better he will contact me in a week  Follow-Up Instructions: As needed  Orders:  Orders Placed This Encounter  Procedures   XR Shoulder Left   No orders of the defined types were placed in this encounter.     Procedures: Large Joint Inj: L subacromial bursa on 07/27/2022 11:26 AM Indications: diagnostic evaluation and pain Details: 25 G 1.5 in needle, posterior approach  Arthrogram: No  Medications: 2 mL lidocaine 1 %; 80 mg methylPREDNISolone acetate 40 MG/ML; 2 mL bupivacaine 0.25 % Outcome: tolerated well, no immediate complications Procedure, treatment alternatives, risks and benefits explained, specific risks discussed. Consent was given by the patient.     Clinical Data: No additional findings.   Subjective: Chief Complaint  Patient presents with   Left Shoulder - Pain    HPI patient is a pleasant active 57 year old gentleman with a chief complaint of left shoulder pain.  Denies any injury though does quite a bit of weight lifting and training.  Denies any neck pain.  He said he has had bursitis in his shoulder in the past and done well with an injection  Pain Assessment  Average  Pain: 3 Current Pain: 4 Aggravating Factors: Overhead activities Alleviating Factors: Avoiding overhead activities  Review of Systems  All other systems reviewed and are negative.   Objective: Vital Signs: There were no vitals taken for this visit.  Physical Exam Constitutional:      Appearance: Normal appearance.  Pulmonary:     Effort: Pulmonary effort is normal.  Skin:    General: Skin is warm and dry.  Neurological:     Mental Status: He is alert.     Ortho Exam Examination of his left shoulder he has no redness no erythema.  He has full forward elevation he has good internal rotation behind his back strength is 5 out of 5 no pain with external rotation he does have positive empty can test negative speeds test grip strength intact Specialty Comments:  No specialty comments available.  Imaging: XR Shoulder Left  Result Date: 07/27/2022 Rest of his shoulder demonstrate no acute fracture.  Fairly good congruent spacing of the glenohumeral joint.  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   Hepatic steatosis 06/21/2019   BPH (benign prostatic hyperplasia) 05/10/2018   History of kidney cancer 05/10/2018  Testosterone deficiency 08/16/2017   Meniere disease 09/02/2014   Morbid obesity ([place]) - BMI 30+ with OSA 07/15/2014   TMJ (temporomandibular joint syndrome) 04/01/2014   Medication management 08/31/2013   GERD 08/31/2013   OSA (obstructive sleep apnea) 07/13/2013   Essential hypertension 05/31/2013   Hyperlipidemia 05/31/2013   Prediabetes 05/31/2013   Vitamin D deficiency 05/31/2013   Past Medical History:  Diagnosis Date   Eye abnormalities    pt states has had twice that the right eye has come out of socket    GERD (gastroesophageal reflux disease)    Hearing loss in right ear    past hx of working with  explosives   Hyperlipidemia    Hypertension    Hypogonadism male    Lumbar spinal stenosis 07/31/2014   Pre-diabetes    PTSD (post-traumatic stress disorder)    PTSD (post-traumatic stress disorder)    Sleep apnea    pt does not use CPAP   Tinnitus    Vertigo     Family History  Problem Relation Age of Onset   Breast cancer Mother    Hypertension Maternal Grandmother    Breast cancer Paternal Grandmother     Past Surgical History:  Procedure Laterality Date   BUNIONECTOMY Bilateral    CATARACT EXTRACTION Right    2016   CATARACT EXTRACTION W/ INTRAOCULAR LENS IMPLANT Left 2014   DRUG INDUCED ENDOSCOPY N/A 03/02/2019   Procedure: DRUG INDUCED ENDOSCOPY;  Surgeon: [name], MD;  Location: [place];  Service: ENT;  Laterality: N/A;   EYE SURGERY     IMPLANTATION OF HYPOGLOSSAL NERVE STIMULATOR Right 05/02/2019   Procedure: IMPLANTATION OF HYPOGLOSSAL NERVE STIMULATOR;  Surgeon: [name], MD;  Location: [place];  Service: ENT;  Laterality: Right;  Neck, Chest, Lower Lateral Chest   LUMBAR LAMINECTOMY/DECOMPRESSION MICRODISCECTOMY Left 07/31/2014   Procedure: LUMBAR LAMINECTOMY/DECOMPRESSION MICRODISCECTOMY 1 LEVEL L5-S1 ON LEFT;  Surgeon: Susa Day, MD;  Location: [facility];  Service: Orthopedics;  Laterality: Left;   PARTIAL NEPHRECTOMY Left 2008   TOTAL HIP ARTHROPLASTY Right 2007   Social History   Occupational History   Occupation: Air cabin crew    Employer: [employer]    Comment: Retired  Tobacco Use   Smoking status: Never   Smokeless tobacco: Never  Vaping Use   Vaping Use: Never used  Substance and Sexual Activity   Alcohol use: No   Drug use: No   Sexual activity: Yes    Partners: Female    Birth control/protection: None

## 2022-10-14 ENCOUNTER — Ambulatory Visit (INDEPENDENT_AMBULATORY_CARE_PROVIDER_SITE_OTHER): Payer: Medicare Other | Admitting: Internal Medicine

## 2022-10-14 ENCOUNTER — Encounter: Payer: Self-pay | Admitting: Internal Medicine

## 2022-10-14 VITALS — BP 118/88 | HR 83 | Temp 97.3°F | Resp 16 | Ht 72.0 in | Wt 273.6 lb

## 2022-10-14 DIAGNOSIS — R42 Dizziness and giddiness: Secondary | ICD-10-CM

## 2022-10-14 DIAGNOSIS — J069 Acute upper respiratory infection, unspecified: Secondary | ICD-10-CM | POA: Diagnosis not present

## 2022-10-14 DIAGNOSIS — H6501 Acute serous otitis media, right ear: Secondary | ICD-10-CM

## 2022-10-14 MED ORDER — DEXAMETHASONE 4 MG PO TABS: ORAL_TABLET | ORAL | 0 refills | Status: DC

## 2022-10-14 MED ORDER — PSEUDOEPHEDRINE HCL ER 120 MG PO TB12: ORAL_TABLET | ORAL | 1 refills | Status: AC

## 2022-10-14 NOTE — Progress Notes (Signed)
Future Appointments  Date Time Provider Department  11/23/2022 10:30 AM Lucky Cowboy, MD [facility]  05/23/2023  2:00 PM Adela Glimpse, NP [facility]    History of Present Illness:       Patient is a very nice 57 yo  MBM with HTN, PreDM, OSA/CPAP, HLD, Testosterone & Vit D Deficiency , hx/o meniere's Dz presents now with c/o dizziness & head congestion    Past Hx significant for : - In 2020 he had implantation of a hypoglossal nerve stimulator by ENT Dr Jenne Pane for his central OSA. - 2014 had sudden decrease in R ear hearing. Audiogram - > Rt. SNHL - 2015 had an episode of Vertigo & R tinnitus x 4 days - 2016 had eval by Dorinda Hill  at the [facility] [place]   Current Outpatient Medications on File Prior to Visit  Medication Sig   Ascorbic Acid (VITAMIN C) 1000 MG tablet Take 1,000 mg by mouth daily.   carboxymethylcellulose (REFRESH TEARS) 0.5 % SOLN Place 1 drop into both eyes 3 (three) times daily as needed (dry eyes).   cetirizine (ZYRTEC) 10 MG tablet Take 1 tablet (10 mg total) by mouth daily. (Patient taking differently: Take 10 mg by mouth See admin instructions. Take 10 mg daily for 1 week then take loratadine 10 mg daily for 1 week, alternating weeks)   Cholecalciferol (VITAMIN D3) 50 MCG (2000 UT) capsule Take 4,000 Units by mouth daily.   diclofenac Sodium (VOLTAREN) 1 % GEL Apply 4 g topically 4 (four) times daily.   Ginger, Zingiber officinalis, (GINGER PO) Take by mouth.   ibuprofen (IBU) 800 MG tablet Take 1 tablet (800 mg total) by mouth every 8 (eight) hours as needed.   lansoprazole (PREVACID) 15 MG capsule Take 15 mg by mouth daily.   lisinopril-hydrochlorothiazide (ZESTORETIC) 20-12.5 MG tablet Take 1 tablet by mouth daily.   loratadine (CLARITIN) 10 MG tablet Take by mouth.   magnesium oxide (MAG-OX) 400 MG tablet Take 400 mg by mouth daily.   nortriptyline (PAMELOR) 10 MG capsule Take 10 mg by mouth daily as needed (migraines).    olmesartan (BENICAR) 40 MG tablet TAKE 1/2 TO 1 TABLET BY MOUTH AT NIGHT FOR BLOOD PRESSURE   Red Yeast Rice Extract (RED YEAST RICE PO) Take by mouth daily.   tadalafil (CIALIS) 5 MG tablet Take 1 tablet daily for ED and prostate.   tiZANidine (ZANAFLEX) 4 MG tablet Take 1 tab (4 mg) QD PRN for treatment of muscle spasms associated with back pain.   vitamin E 1000 UNIT capsule Take 1,000 Units by mouth daily.   Zinc 50 MG TABS Take 50 mg by mouth daily.    No current facility-administered medications on file prior to visit.    Allergies  Allergen Reactions   Ziac [Bisoprolol-Hydrochlorothiazide]     Erectile dysfunction   Topiramate     Other reaction(s): Suicidal thoughts     Problem list He has Essential hypertension; Hyperlipidemia; Prediabetes; Vitamin D deficiency; OSA (obstructive sleep apnea); Medication management; GERD; TMJ (temporomandibular joint syndrome); Morbid obesity (H[facility]) - BMI 30+ with OSA; Meniere disease; Testosterone deficiency; BPH (benign prostatic hyperplasia); History of kidney cancer; Hepatic steatosis; and HSV-2 seropositive on their problem list.   Observations/Objective:  BP 118/88   Pulse 83   Temp (!) 97.3 F (36.3 C)   Resp 16   Ht 6' (1.829 m)   Wt 273 lb 9.6 oz (124.1 kg)   SpO2 98%  BMI 37.11 kg/m   HEENT - WNL. EACs patent . Lt TM - Nl  and Rt TM retracted distorted  - Both Nl color.                    N/O/P - Clear  Neck - supple.  Chest - Clear equal BS. Cor - Nl HS. RRR w/o sig MGR. PP 1(+). No edema. MS- FROM w/o deformities.  Gait Nl. Neuro -  Nl w/o focal abnormalities.   Assessment and Plan:   1. Right acute serous otitis media, recurrence not specified  - dexamethasone 4 MG tablet;  Take 1 tab 3 x day for 5  days, then 2 x day for 5 days, then 1 tab daily   Dispense: 30 tablet; Refill: 0   - pseudoephedrine 120 MG 12 hr tablet;  Take  1 tablet  2 x /day (every 12 hours)  for Ear Fluid, Sinus & Chest Congestion    Dispense: 180 tablet; Refill: 1   2. Upper respiratory tract infection  - dexamethasone  4 MG tablet;  Take 1 tab 3 x day for 5  days, then 2 x day for 5 days, then 1 tab daily   Dispense: 30 tablet; Refill: 0  - pseudoephedrine  120 MG 12 hr tablet;  Dispense: 180 tablet; Refill: 1  3. Vertigo  - dexamethasone 4 MG tablet;  Take 1 tab 3 x day for 5  days, then 2 x day for 5 days, then 1 tab daily   Dispense: 30 tablet; Refill: 0    Follow Up Instructions:        I discussed the assessment and treatment plan with the patient. The patient was provided an opportunity to ask questions and all were answered. The patient agreed with the plan and demonstrated an understanding of the instructions.       The patient was advised to call back or seek an in-person evaluation if the symptoms worsen or if the condition fails to improve as anticipated.    Marinus Maw, MD

## 2022-11-22 ENCOUNTER — Encounter: Payer: Self-pay | Admitting: Internal Medicine

## 2022-11-22 NOTE — Progress Notes (Signed)
Future Appointments  Date Time Provider Department  11/23/2022              6 mo ov 10:30 AM Lucky Cowboy, MD [facility]  05/23/2023           cpe  2:00 PM Adela Glimpse, NP [facility]    History of Present Illness:       This very nice 57 y.o.male presents for 3 month follow up with  HTN, PreDM, OSA/CPAP, HLD, Testosterone & Vit D Deficiency , hx/o Meniere's Dz .    Patient has hx/o OSA & in Dec 2020 , he had implantation of a hypoglossal nerve stimulator by ENT Dr Jenne Pane.        Patient is treated for HTN  since 2013  & BP has been controlled at home. Today's BP is nat goal - 118/78. Patient has had no complaints of any cardiac type chest pain, palpitations, dyspnea / orthopnea / PND, dizziness, claudication, or dependent edema.        Hyperlipidemia is controlled with diet. Last Lipids were near goal :  Lab Results  Component Value Date   CHOL 167 05/19/2022   HDL 47 05/19/2022   LDLCALC 100 (H) 05/19/2022   TRIG 106 05/19/2022   CHOLHDL 3.6 05/19/2022      Also, the patient has history of PreDiabetes ( A1c 5.9% /2013  and 6.0% / 2016) and has had no symptoms of reactive hypoglycemia, diabetic polys, paresthesias or visual blurring.  Last A1c was not at goal :  Lab Results  Component Value Date   HGBA1C 6.1 (H) 05/19/2022                                                          Further, the patient also has history of Vitamin D Deficiency  ("16" / 2013) and supplements vitamin D . Last vitamin D was at goal :  Lab Results  Component Value Date   VD25OH 79 05/19/2022     Current Outpatient Medications  Medication Instructions   REFRESH TEARS 0.5 % SOLN 1 drop, Both Eyes, 3 times daily PRN   cetirizine (ZYRTEC) 10 mg, Oral, Daily   diclofenac Sodium (VOLTAREN) 4 g, Topical, 4 times daily   Ginger, Zingiber officinalis, (GINGER ) Oral   ibuprofen (IBU) 800 mg, Oral, Every 8 hours PRN   lansoprazole (PREVACID) 15 mg, Oral, Daily    lisinopril-hydrochlorothiazide (ZESTORETIC) 20-12.5 MG tablet 1 tablet, Oral, Daily   magnesium oxide (MAG-OX) 400 mg, Oral, Daily   nortriptyline (PAMELOR) 10 mg, Oral, Daily PRN   olmesartan (BENICAR) 40 MG tablet TAKE 1/2 TO 1 TABLET  AT NIGHT    pseudoephedrine (SUDAFED) 120 MG  Take  1 tablet  2 x /day    Red Yeast Rice Extract  Oral, Daily   tadalafil (CIALIS) 5 MG tablet Take 1 tablet daily for ED and prostate.   tiZANidine (ZANAFLEX) 4 MG tablet Take 1 tab (4 mg) QD PRN    vitamin C 1,000 mg, Oral, Daily   Vitamin D3 4,000 Units, Oral, Daily   vitamin E 1,000 Units, Oral, Daily   Zinc 50 mg, Oral, Daily       Allergies  Allergen Reactions   Ziac [Bisoprolol-Hydrochlorothiazide]     Erectile dysfunction   Topiramate  Other reaction(s): Suicidal thoughts     PMHx:   Past Medical History:  Diagnosis Date   Eye abnormalities    pt states has had twice that the right eye has come out of socket    GERD (gastroesophageal reflux disease)    Hearing loss in right ear    past hx of working with explosives   Hyperlipidemia    Hypertension    Hypogonadism male    Lumbar spinal stenosis 07/31/2014   Pre-diabetes    PTSD (post-traumatic stress disorder)    PTSD (post-traumatic stress disorder)    Sleep apnea    pt does not use CPAP   Tinnitus    Vertigo       Immunization History  Administered Date(s) Administered   Influenza,inj,Quad PF,6+ Mos 02/20/2022   Influenza-Unspecified 03/24/2018, 03/26/2019, 02/22/2020, 04/08/2021   PFIZER Comirnaty(Gray Top)Covid-19 Tri-Sucrose Vaccine 12/15/2020   PFIZER(Purple Top)SARS-COV-2 Vaccination 07/19/2019, 08/09/2019   PPD Test 12/18/2013, 02/11/2015   Pfizer Covid-19 Vaccine Bivalent Booster 66yrs & up 03/05/2020, 12/15/2020, 04/08/2021   Td 05/24/2010   Tdap 07/20/2014   Zoster Recombinant(Shingrix) 04/12/2019, 03/10/2020      Past Surgical History:  Procedure Laterality Date   BUNIONECTOMY Bilateral    CATARACT  EXTRACTION Right    2016   CATARACT EXTRACTION W/ INTRAOCULAR LENS IMPLANT Left 2014   DRUG INDUCED ENDOSCOPY N/A 03/02/2019   Procedure: DRUG INDUCED ENDOSCOPY;  Surgeon: Christia Reading, MD;  Location: [facility];  Service: ENT;  Laterality: N/A;   EYE SURGERY     IMPLANTATION OF HYPOGLOSSAL NERVE STIMULATOR Right 05/02/2019   Procedure: IMPLANTATION OF HYPOGLOSSAL NERVE STIMULATOR;  Surgeon: Christia Reading, MD;  Location: [facility];  Service: ENT;  Laterality: Right;  Neck, Chest, Lower Lateral Chest   LUMBAR LAMINECTOMY/DECOMPRESSION MICRODISCECTOMY Left 07/31/2014   Procedure: LUMBAR LAMINECTOMY/DECOMPRESSION MICRODISCECTOMY 1 LEVEL L5-S1 ON LEFT;  Surgeon: Jene Every, MD;  Location[facility];  Service: Orthopedics;  Laterality: Left;   PARTIAL NEPHRECTOMY Left 2008   TOTAL HIP ARTHROPLASTY Right 2007     FHx:    Reviewed / unchanged   SHx:    Reviewed / unchanged    Systems Review:  Constitutional: Denies fever, chills, wt changes, headaches, insomnia, fatigue, night sweats, change in appetite. Eyes: Denies redness, blurred vision, diplopia, discharge, itchy, watery eyes.  ENT: Denies discharge, congestion, post nasal drip, epistaxis, sore throat, earache, hearing loss, dental pain, tinnitus, vertigo, sinus pain, snoring.  CV: Denies chest pain, palpitations, irregular heartbeat, syncope, dyspnea, diaphoresis, orthopnea, PND, claudication or edema. Respiratory: denies cough, dyspnea, DOE, pleurisy, hoarseness, laryngitis, wheezing.  Gastrointestinal: Denies dysphagia, odynophagia, heartburn, reflux, water brash, abdominal pain or cramps, nausea, vomiting, bloating, diarrhea, constipation, hematemesis, melena, hematochezia  or hemorrhoids. Genitourinary: Denies dysuria, frequency, urgency, nocturia, hesitancy, discharge, hematuria or flank pain. Musculoskeletal: Denies arthralgias, myalgias, stiffness, jt. swelling, pain, limping or strain/sprain.  Skin: Denies pruritus,  rash, hives, warts, acne, eczema or change in skin lesion(s). Neuro: No weakness, tremor, incoordination, spasms, paresthesia or pain. Psychiatric: Denies confusion, memory loss or sensory loss. Endo: Denies change in weight, skin or hair change.  Heme/Lymph: No excessive bleeding, bruising or enlarged lymph nodes.   Physical Exam  BP 118/78   Pulse 69   Temp 97.9 F (36.6 C)   Resp 16   Ht 6' (1.829 m)   Wt 272 lb (123.4 kg)   SpO2 98%   BMI 36.89 kg/m   Appears  well nourished, well groomed  and in no distress.  Eyes: PERRLA, EOMs, conjunctiva no swelling or erythema. Sinuses: No frontal/maxillary tenderness ENT/Mouth: EAC's clear, TM's nl w/o erythema, bulging. Nares clear w/o erythema, swelling, exudates. Oropharynx clear without erythema or exudates. Oral hygiene is good. Tongue normal, non obstructing. Hearing intact.  Neck: Supple. Thyroid not palpable. Car 2+/2+ without bruits, nodes or JVD. Chest: Respirations nl with BS clear & equal w/o rales, rhonchi, wheezing or stridor.  Cor: Heart sounds normal w/ regular rate and rhythm without sig. murmurs, gallops, clicks or rubs. Peripheral pulses normal and equal  without edema.  Abdomen: Soft & bowel sounds normal. Non-tender w/o guarding, rebound, hernias, masses or organomegaly.  Lymphatics: Unremarkable.  Musculoskeletal: Full ROM all peripheral extremities, joint stability, 5/5 strength and normal gait.  Skin: Warm, dry without exposed rashes, lesions or ecchymosis apparent.  Neuro: Cranial nerves intact, reflexes equal bilaterally. Sensory-motor testing grossly intact. Tendon reflexes grossly intact.  Pysch: Alert & oriented x 3.  Insight and judgement nl & appropriate. No ideations.   Assessment and Plan:  1. Essential hypertension   - Continue medication, monitor blood pressure at home.  - Continue DASH diet.  Reminder to go to the ER if any CP,  SOB, nausea, dizziness, severe HA, changes vision/speech.    - CBC  with Differential/Platelet - COMPLETE METABOLIC PANEL WITH GFR - Magnesium - TSH   2. Hyperlipidemia, mixed  - Continue diet/meds, exercise,& lifestyle modifications.  - Continue monitor periodic cholesterol/liver & renal functions     - Lipid panel - TSH   3. Abnormal glucose  - Continue diet, exercise  - Lifestyle modifications.  - Monitor appropriate labs    - Hemoglobin A1c - Insulin, random   4. Vitamin D deficiency  - Continue supplementation   - VITAMIN D 25 Hydroxy    5. Medication management  - CBC with Differential/Platelet - COMPLETE METABOLIC PANEL WITH GFR - Magnesium - Lipid panel - TSH - Hemoglobin A1c - Insulin, random - VITAMIN D 25 Hydroxy         Discussed  regular exercise, BP monitoring, weight control to achieve/maintain BMI less than 25 and discussed med and SE's. Recommended labs to assess /monitor clinical status .  I discussed the assessment and treatment plan with the patient. The patient was provided an opportunity to ask questions and all were answered. The patient agreed with the plan and demonstrated an understanding of the instructions.  I provided over 30 minutes of exam, counseling, chart review and  complex critical decision making.        The patient was advised to call back or seek an in-person evaluation if the symptoms worsen or if the condition fails to improve as anticipated.   Marinus Maw, MD

## 2022-11-22 NOTE — Patient Instructions (Signed)

## 2022-11-23 ENCOUNTER — Ambulatory Visit (INDEPENDENT_AMBULATORY_CARE_PROVIDER_SITE_OTHER): Payer: Medicare Other | Admitting: Internal Medicine

## 2022-11-23 ENCOUNTER — Encounter: Payer: Self-pay | Admitting: Internal Medicine

## 2022-11-23 VITALS — BP 118/78 | HR 69 | Temp 97.9°F | Resp 16 | Ht 72.0 in | Wt 272.0 lb

## 2022-11-23 DIAGNOSIS — E559 Vitamin D deficiency, unspecified: Secondary | ICD-10-CM

## 2022-11-23 DIAGNOSIS — E782 Mixed hyperlipidemia: Secondary | ICD-10-CM

## 2022-11-23 DIAGNOSIS — I10 Essential (primary) hypertension: Secondary | ICD-10-CM | POA: Diagnosis not present

## 2022-11-23 DIAGNOSIS — R7309 Other abnormal glucose: Secondary | ICD-10-CM

## 2022-11-23 DIAGNOSIS — Z79899 Other long term (current) drug therapy: Secondary | ICD-10-CM

## 2022-11-24 ENCOUNTER — Other Ambulatory Visit: Payer: Self-pay | Admitting: Internal Medicine

## 2022-11-24 DIAGNOSIS — E782 Mixed hyperlipidemia: Secondary | ICD-10-CM

## 2022-11-24 LAB — CBC WITH DIFFERENTIAL/PLATELET
Absolute Monocytes: 643 cells/uL (ref 200–950)
Basophils Absolute: 41 cells/uL (ref 0–200)
Basophils Relative: 0.7 %
Eosinophils Absolute: 89 cells/uL (ref 15–500)
Eosinophils Relative: 1.5 %
HCT: 43.2 % (ref 38.5–50.0)
Hemoglobin: 14.1 g/dL (ref 13.2–17.1)
Lymphs Abs: 1817 cells/uL (ref 850–3900)
MCH: 26.6 pg — ABNORMAL LOW (ref 27.0–33.0)
MCHC: 32.6 g/dL (ref 32.0–36.0)
MCV: 81.5 fL (ref 80.0–100.0)
MPV: 11.9 fL (ref 7.5–12.5)
Monocytes Relative: 10.9 %
Neutro Abs: 3310 cells/uL (ref 1500–7800)
Neutrophils Relative %: 56.1 %
Platelets: 229 10*3/uL (ref 140–400)
RBC: 5.3 10*6/uL (ref 4.20–5.80)
RDW: 14.7 % (ref 11.0–15.0)
Total Lymphocyte: 30.8 %
WBC: 5.9 10*3/uL (ref 3.8–10.8)

## 2022-11-24 LAB — COMPLETE METABOLIC PANEL WITH GFR
AG Ratio: 1.8 (calc) (ref 1.0–2.5)
ALT: 25 U/L (ref 9–46)
AST: 24 U/L (ref 10–35)
Albumin: 4.5 g/dL (ref 3.6–5.1)
Alkaline phosphatase (APISO): 45 U/L (ref 35–144)
BUN: 16 mg/dL (ref 7–25)
CO2: 26 mmol/L (ref 20–32)
Calcium: 9.4 mg/dL (ref 8.6–10.3)
Chloride: 105 mmol/L (ref 98–110)
Creat: 1.23 mg/dL (ref 0.70–1.30)
Globulin: 2.5 g/dL (calc) (ref 1.9–3.7)
Glucose, Bld: 96 mg/dL (ref 65–99)
Potassium: 3.7 mmol/L (ref 3.5–5.3)
Sodium: 139 mmol/L (ref 135–146)
Total Bilirubin: 0.6 mg/dL (ref 0.2–1.2)
Total Protein: 7 g/dL (ref 6.1–8.1)
eGFR: 69 mL/min/{1.73_m2} (ref >=60)

## 2022-11-24 LAB — HEMOGLOBIN A1C
Hgb A1c MFr Bld: 6.2 % of total Hgb — ABNORMAL HIGH (ref <5.7)
Mean Plasma Glucose: 131 mg/dL
eAG (mmol/L): 7.3 mmol/L

## 2022-11-24 LAB — MAGNESIUM: Magnesium: 2.1 mg/dL (ref 1.5–2.5)

## 2022-11-24 LAB — LIPID PANEL
Cholesterol: 187 mg/dL (ref <200)
HDL: 43 mg/dL (ref >=40)
LDL Cholesterol (Calc): 121 mg/dL (calc) — ABNORMAL HIGH
Non-HDL Cholesterol (Calc): 144 mg/dL (calc) — ABNORMAL HIGH (ref <130)
Total CHOL/HDL Ratio: 4.3 (calc) (ref <5.0)
Triglycerides: 115 mg/dL (ref <150)

## 2022-11-24 LAB — TSH: TSH: 1.33 mIU/L (ref 0.40–4.50)

## 2022-11-24 LAB — VITAMIN D 25 HYDROXY (VIT D DEFICIENCY, FRACTURES): Vit D, 25-Hydroxy: 85 ng/mL (ref 30–100)

## 2022-11-24 LAB — INSULIN, RANDOM: Insulin: 28.7 u[IU]/mL — ABNORMAL HIGH

## 2022-11-24 MED ORDER — ROSUVASTATIN CALCIUM 20 MG PO TABS
ORAL_TABLET | ORAL | 3 refills | Status: AC
Start: 2022-11-24 — End: ?

## 2022-11-24 NOTE — Progress Notes (Signed)
^<^<^<^<^<^<^<^<^<^<^<^<^<^<^<^<^<^<^<^<^<^<^<^<^<^<^<^<^<^<^<^<^<^<^<^<^ ^>^>^>^>^>^>^>^>^>^>^>>^>^>^>^>^>^>^>^>^>^>^>^>^>^>^>^>^>^>^>^>^>^>^>^>^>   -  Total  Chol = 187    -  is OK             (  Ideal  or  Goal is less than 180  !  )   BUT . . . .   -  Bad / Dangerous LDL  Chol =  121  - is VERY   Elevated              (  Ideal  or  Goal is less than 70  !  )   - So you can stop the      Red Yeast Rice Extract      as obviously it's not working and   I sent in a new Rx for Rosuvastatin, so   you won't have a Heart Attack, Stroke,  get cancer or develop Alzheimer  Dementia  ^<^<^<^<^<^<^<^<^<^<^<^<^<^<^<^<^<^<^<^<^<^<^<^<^<^<^<^<^<^<^<^<^<^<^<^<^ ^>^>^>^>^>^>^>^>^>^>^>^>^>^>^>^>^>^>^>^>^>^>^>^>^>^>^>^>^>^>^>^>^>^>^>^>^  - A1c = 6.2% - 12 week average blood sugar is                                                 creeping up higher in the early or prediabetic range   & when it gets to 6.5% , then you'll be a full -fledged Diabetic !  Unless you get on a better diet & lose weight    - Being diabetic has a  300% increased risk for heart attack,                                                    stroke, cancer, and alzheimer- type vascular dementia.     It is very important that you work harder with diet by                                     avoiding all foods that are white except chicken, fish & calliflower.  - Avoid white rice  (brown & wild rice is OK),   - Avoid white potatoes  (sweet potatoes in moderation is OK),   White bread or wheat bread or anything made out of   white flour like bagels, donuts, rolls, buns, biscuits, cakes,  - pastries, cookies, pizza crust, and pasta (made from  white flour & egg whites)   - vegetarian pasta or spinach or wheat pasta is OK.  - Multigrain breads like Arnold's, Pepperidge Farm or   multigrain sandwich thins or high fiber breads like   Eureka bread or "Dave's Killer" breads that are  4 to 5 grams fiber per slice !   are best.    Diet, exercise and weight loss can reverse and cure diabetes in the early stages.    - Diet, exercise and weight loss is very important in the   control and prevention of complications of diabetes which  affects every system in your body, ie.   -Brain - dementia/stroke,  - eyes - glaucoma/blindness,  - heart - heart attack/heart failure,  - kidneys - dialysis,  - stomach - gastric paralysis,  - intestines - malabsorption,  - nerves - severe painful neuritis,  - circulation - gangrene & loss of a leg(s)  - and finally  . . . . . . . . . . . . . . . . . .    - cancer and Alzheimers. ^<^<^<^<^<^<^<^<^<^<^<^<^<^<^<^<^<^<^<^<^<^<^<^<^<^<^<^<^<^<^<^<^<^<^<^<^ ^>^>^>^>^>^>^>^>^>^>^>>^>^>^>^>^>^>^>^>^>^>^>^>^>^>^>^>^>^>^>^>^>^>^>^>^>  -  Vit D = 85 - Excellent - Continue dose same   ^<^<^<^<^<^<^<^<^<^<^<^<^<^<^<^<^<^<^<^<^<^<^<^<^<^<^<^<^<^<^<^<^<^<^<^<^ ^>^>^>^>^>^>^>^>^>^>^>>^>^>^>^>^>^>^>^>^>^>^>^>^>^>^>^>^>^>^>^>^>^>^>^>^>  -  All Else - CBC - Kidneys - Electrolytes - Liver - Magnesium & Thyroid    - all  Normal / OK  ^<^<^<^<^<^<^<^<^<^<^<^<^<^<^<^<^<^<^<^<^<^<^<^<^<^<^<^<^<^<^<^<^<^<^<^<^ ^>^>^>^>^>^>^>^>^>^>^>>^>^>^>^>^>^>^>^>^>^>^>^>^>^>^>^>^>^>^>^>^>^>^>^>^>

## 2022-11-26 ENCOUNTER — Encounter: Payer: Self-pay | Admitting: Internal Medicine

## 2023-03-08 ENCOUNTER — Encounter: Payer: Self-pay | Admitting: Nurse Practitioner

## 2023-03-08 ENCOUNTER — Ambulatory Visit (INDEPENDENT_AMBULATORY_CARE_PROVIDER_SITE_OTHER): Payer: Medicare Other | Admitting: Nurse Practitioner

## 2023-03-08 VITALS — BP 128/88 | HR 66 | Temp 97.6°F | Wt 277.4 lb

## 2023-03-08 DIAGNOSIS — E349 Endocrine disorder, unspecified: Secondary | ICD-10-CM

## 2023-03-08 DIAGNOSIS — G4733 Obstructive sleep apnea (adult) (pediatric): Secondary | ICD-10-CM

## 2023-03-08 DIAGNOSIS — K2100 Gastro-esophageal reflux disease with esophagitis, without bleeding: Secondary | ICD-10-CM

## 2023-03-08 DIAGNOSIS — Z85528 Personal history of other malignant neoplasm of kidney: Secondary | ICD-10-CM

## 2023-03-08 DIAGNOSIS — N400 Enlarged prostate without lower urinary tract symptoms: Secondary | ICD-10-CM

## 2023-03-08 DIAGNOSIS — E782 Mixed hyperlipidemia: Secondary | ICD-10-CM

## 2023-03-08 DIAGNOSIS — R7303 Prediabetes: Secondary | ICD-10-CM

## 2023-03-08 DIAGNOSIS — Z79899 Other long term (current) drug therapy: Secondary | ICD-10-CM

## 2023-03-08 DIAGNOSIS — N529 Male erectile dysfunction, unspecified: Secondary | ICD-10-CM

## 2023-03-08 DIAGNOSIS — H8109 Meniere's disease, unspecified ear: Secondary | ICD-10-CM | POA: Diagnosis not present

## 2023-03-08 DIAGNOSIS — E559 Vitamin D deficiency, unspecified: Secondary | ICD-10-CM

## 2023-03-08 DIAGNOSIS — I10 Essential (primary) hypertension: Secondary | ICD-10-CM | POA: Diagnosis not present

## 2023-03-08 NOTE — Patient Instructions (Signed)

## 2023-03-08 NOTE — Progress Notes (Signed)
Follow Up  Assessment and Plan:  Diagnoses and all orders for this visit:  Essential hypertension Continue medications Lisinopril-HCTZ Okay to d/c Olmesartan - BP very well controlled without starting.   Monitor blood pressure at home; call if consistently over 130/80 Continue DASH diet.   Reminder to go to the ER if any CP, SOB, nausea, dizziness, severe HA, changes vision/speech, left arm numbness and tingling and jaw pain.  OSA (obstructive sleep apnea) Recent hypoglossal nerve stimulator implantation, - followed by Dr. Jenne Pane, improved per patient  GERD No suspected reflux complications (Barret/stricture). Lifestyle modification:  wt loss, avoid meals 2-3h before bedtime. Consider eliminating food triggers:  chocolate, caffeine, EtOH, acid/spicy food.  Meniere's disease, unspecified laterality Suspected diagnosis by [facility] ENT/audiology after episodes of decreased hearing, vertigo and syncope. Reportedly currently treated by lifestyle modification; surgery was discussed but declined at this time. No further episodes of syncope.   Hyperlipidemia Continue Rosuvastatin  Discussed lifestyle modifications. Recommended diet heavy in fruits and veggies, omega 3's. Decrease consumption of animal meats, cheeses, and dairy products. Remain active and exercise as tolerated. Continue to monitor. Check lipids/TSH  Prediabetes Education: Reviewed 'ABCs' of diabetes management  Discussed goals to be met and/or maintained include A1C (<7) Blood pressure (<130/80) Cholesterol (LDL <70) Continue Eye Exam yearly  Continue Dental Exam Q6 mo Discussed dietary recommendations Discussed Physical Activity recommendations Check A1C  Vitamin D deficiency Continue supplementation  Check and monitor levels  Medication management All medications discussed and reviewed in full. All questions and concerns regarding medications addressed.    Morbid Obesity - BMI 30+ with OSA Discussed  appropriate BMI Diet modification. Physical activity. Encouraged/praised to build confidence.  History of L kidney cancer/ renal cysts S/p partial nephrectomy, now monitored annually by Dr. Liliane Shi  BPH without symptoms Per urology notes; denies symptoms; monitor Monitor PSA  Testosterone deficiency/Erectile dysfunction Doing well with daily cialis;  follow up urology   Orders Placed This Encounter  Procedures   CBC with Differential/Platelet   COMPLETE METABOLIC PANEL WITH GFR   Lipid panel   Hemoglobin A1c   Notify office for further evaluation and treatment, questions or concerns if any reported s/s fail to improve.   The patient was advised to call back or seek an in-person evaluation if any symptoms worsen or if the condition fails to improve as anticipated.   Further disposition pending results of labs. Discussed med's effects and SE's.    I discussed the assessment and treatment plan with the patient. The patient was provided an opportunity to ask questions and all were answered. The patient agreed with the plan and demonstrated an understanding of the instructions.  Discussed med's effects and SE's. Screening labs and tests as requested with regular follow-up as recommended.  I provided 30 minutes of face-to-face time during this encounter including counseling, chart review, and critical decision making was preformed.   Future Appointments  Date Time Provider Department Center  06/09/2023 10:00 AM Adela Glimpse, NP [facility] None  09/08/2023  9:30 AM Lucky Cowboy, MD [facility] None     HPI 57 y.o. AA male - . Patient presents for a general follow up. He has Essential hypertension; Hyperlipidemia; Prediabetes; Vitamin D deficiency; OSA (obstructive sleep apnea); Medication management; GERD; TMJ (temporomandibular joint syndrome); Morbid obesity (HCC) - BMI 30+ with OSA; Meniere disease; Testosterone deficiency; BPH (benign prostatic hyperplasia); History of  kidney cancer; Hepatic steatosis; and HSV-2 seropositive on their problem list.   Overall he reports feeling well today.  He has  no new or additional concerns.  Follows with our clinic every 3 months.  Retired early at 58 after working for [employer], divorced with two grown children. Has a steady girlfriend. He is leaving for a week long trip to [place] this week to celebrate his girlfriend's birthday.   He is being followed by the [facility] for frequent headaches; he takes nortriptyline 10 mg daily, that he reports is effective.   He is diagnosed with Meniere's disease, has chronic hearing impairment and is followed as needed by [facility]- currently managed by hydrochlorothiazide, allergy medication, lifestyle.   He has hx of OSA previously with CPAP; struggled due to claustrophobia, felt worse with machine, had a repeat sleep study scheduled through the [place]. On 05/02/2019 underwent hypoglossal nerve stimulator placement by ENT Dr. Christia Reading, has done well since then, has follow up sleep study planned next year.   He has GERD well controlled by medication.  BMI is Body mass index is 37.62 kg/m., he has been working on diet and exercise- continues to works out in gym daily.  Does morings beet/fruit juice that he blends himself.  Drinks Hydrowater mixed with coconut.   Wt Readings from Last 3 Encounters:  03/08/23 277 lb 6.4 oz (125.8 kg)  11/23/22 272 lb (123.4 kg)  10/14/22 273 lb 9.6 oz (124.1 kg)   He reports well controlled BPs at home, 120/70s, today their BP is BP: 128/88  He was placed on Olmesartan last OV but did not start taking as he felt as though BP was well controlled with Lisinopril-hydrochlorothiazide.  He does continue to workout. He denies chest pain, shortness of breath, dizziness.   He is not on cholesterol medication and denies myalgias. No family hx of MI or CVA. His cholesterol is not at goal. The cholesterol last visit was:   Lab Results  Component Value Date    CHOL 187 11/23/2022   HDL 43 11/23/2022   LDLCALC 121 (H) 11/23/2022   TRIG 115 11/23/2022   CHOLHDL 4.3 11/23/2022   He has been working on diet and exercise for prediabetes, he is not on bASA, he is on ACE/ARB and denies increased appetite, nausea, paresthesia of the feet, polydipsia, polyuria, visual disturbances and vomiting. Last A1C in the office was:  Lab Results  Component Value Date   HGBA1C 6.2 (H) 11/23/2022   Last GFR:   Lab Results  Component Value Date   GFRAA 89 11/13/2020   Patient is on Vitamin D supplement, taking ? 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 Liliane Shi ,follows annually, reports had PSA check then. On cialis 5 mg daily for ED and BPH.  Last PSA was: Lab Results  Component Value Date   PSA 1.13 05/19/2022   He has had mild persistent LFT elevation; improved with reducing sodas; [place] 06/20/2019 shows fatty liver;  Lab Results  Component Value Date   ALT 25 11/23/2022   AST 24 11/23/2022   ALKPHOS 52 03/23/2016   BILITOT 0.6 11/23/2022     Current Medications:  Current Outpatient Medications on File Prior to Visit  Medication Sig Dispense Refill   Ascorbic Acid (VITAMIN C) 1000 MG tablet Take 1,000 mg by mouth daily.     carboxymethylcellulose (REFRESH TEARS) 0.5 % SOLN Place 1 drop into both eyes  3 (three) times daily as needed (dry eyes).     cetirizine (ZYRTEC) 10 MG tablet Take 1 tablet (10 mg total) by mouth daily. (Patient taking differently: Take 10 mg by mouth See admin instructions. Take 10 mg daily for 1 week then take loratadine 10 mg daily for 1 week, alternating weeks) 90 tablet 4   Cholecalciferol (VITAMIN D3) 50 MCG (2000 UT) capsule Take 4,000 Units by mouth daily.     diclofenac Sodium (VOLTAREN) 1 % GEL Apply 4 g topically 4 (four) times daily. 4 g 2   Ginger, Zingiber officinalis,  (GINGER PO) Take by mouth.     lansoprazole (PREVACID) 15 MG capsule Take 15 mg by mouth daily.     lisinopril-hydrochlorothiazide (ZESTORETIC) 20-12.5 MG tablet Take 1 tablet by mouth daily.     magnesium oxide (MAG-OX) 400 MG tablet Take 400 mg by mouth daily.     nortriptyline (PAMELOR) 10 MG capsule Take 10 mg by mouth daily as needed (migraines).     olmesartan (BENICAR) 40 MG tablet TAKE 1/2 TO 1 TABLET BY MOUTH AT NIGHT FOR BLOOD PRESSURE 30 tablet 3   pseudoephedrine (SUDAFED) 120 MG 12 hr tablet Take  1 tablet  2 x /day (every 12 hours)  for Ear Fluid, Sinus & Chest Congestion 180 tablet 1   rosuvastatin (CRESTOR) 20 MG tablet Take 1 tablet Daily for Cholesterol 90 tablet 3   tadalafil (CIALIS) 5 MG tablet Take 1 tablet daily for ED and prostate. 90 tablet 1   tiZANidine (ZANAFLEX) 4 MG tablet Take 1 tab (4 mg) QD PRN for treatment of muscle spasms associated with back pain. 30 tablet 0   vitamin E 1000 UNIT capsule Take 1,000 Units by mouth daily.     Zinc 50 MG TABS Take 50 mg by mouth daily.      ibuprofen (IBU) 800 MG tablet Take 1 tablet (800 mg total) by mouth every 8 (eight) hours as needed. (Patient not taking: Reported on 03/08/2023) 30 tablet 0   No current facility-administered medications on file prior to visit.   Allergies:  Allergies  Allergen Reactions   Ziac [Bisoprolol-Hydrochlorothiazide]     Erectile dysfunction   Topiramate     Other reaction(s): Suicidal thoughts   Health Maintenance:  Immunization History  Administered Date(s) Administered   Influenza,inj,Quad PF,6+ Mos 02/20/2022   Influenza-Unspecified 03/24/2018, 03/26/2019, 02/22/2020, 04/08/2021   PFIZER Comirnaty(Gray Top)Covid-19 Tri-Sucrose Vaccine 12/15/2020   PFIZER(Purple Top)SARS-COV-2 Vaccination 07/19/2019, 08/09/2019   PPD Test 12/18/2013, 02/11/2015   Pfizer Covid-19 Vaccine Bivalent Booster 56yrs & up 03/05/2020, 12/15/2020, 04/08/2021   Td 05/24/2010   Tdap 07/20/2014   Zoster  Recombinant(Shingrix) 04/12/2019, 03/10/2020    Patient Care Team: Lucky Cowboy, MD as PCP - General (Internal Medicine)  Medical History:  has Essential hypertension; Hyperlipidemia; Prediabetes; Vitamin D deficiency; OSA (obstructive sleep apnea); Medication management; GERD; TMJ (temporomandibular joint syndrome); Morbid obesity (HCC) - BMI 30+ with OSA; Meniere disease; Testosterone deficiency; BPH (benign prostatic hyperplasia); History of kidney cancer; Hepatic steatosis; and HSV-2 seropositive on their problem list. Surgical History:  He  has a past surgical history that includes Total hip arthroplasty (Right, 2007); Cataract extraction w/ intraocular lens implant (Left, 2014); Bunionectomy (Bilateral); Lumbar laminectomy/decompression microdiscectomy (Left, 07/31/2014); Cataract extraction (Right); Partial nephrectomy (Left, 2008); Drug induced endoscopy (N/A, 03/02/2019); Eye surgery; and Implantation of hypoglossal nerve stimulator (Right, 05/02/2019). Family History:  His family history includes Breast cancer in his mother and paternal grandmother; Hypertension in his maternal grandmother. Social  History:   reports that he has never smoked. He has never used smokeless tobacco. He reports that he does not drink alcohol and does not use drugs.     Review of Systems:  Review of Systems  Constitutional:  Negative for malaise/fatigue and weight loss.  HENT:  Negative for ear discharge, ear pain, hearing loss (stable, chronic after military) and tinnitus.   Eyes:  Negative for blurred vision and double vision.  Respiratory:  Negative for cough, shortness of breath and wheezing.   Cardiovascular:  Negative for chest pain, palpitations, orthopnea, claudication and leg swelling.  Gastrointestinal:  Negative for abdominal pain, blood in stool, constipation, diarrhea, heartburn, melena, nausea and vomiting.  Genitourinary: Negative.   Musculoskeletal:  Negative for back pain, falls, joint  pain and myalgias.  Skin:  Negative for rash.  Neurological:  Positive for dizziness (Intermittent; vertigo related to Meniere's). Negative for tingling, sensory change, weakness and headaches.  Endo/Heme/Allergies:  Negative for polydipsia.  Psychiatric/Behavioral: Negative.    All other systems reviewed and are negative.   Physical Exam: Estimated body mass index is 37.62 kg/m as calculated from the following:   Height as of 11/23/22: 6' (1.829 m).   Weight as of this encounter: 277 lb 6.4 oz (125.8 kg). BP 128/88   Pulse 66   Temp 97.6 F (36.4 C)   Wt 277 lb 6.4 oz (125.8 kg)   SpO2 97%   BMI 37.62 kg/m  General Appearance: Well nourished, in no apparent distress.  Eyes: PERRLA, EOMs, conjunctiva no swelling or erythema Sinuses: No Frontal/maxillary tenderness  ENT/Mouth: Ext aud canals clear, normal light reflex with TMs without erythema, bulging. Good dentition. No erythema, swelling, or exudate on post pharynx. Tonsils not swollen or erythematous. HOH.  Neck: Supple, thyroid normal. No bruits. Hypoglossal stimulator palpable in right neck over anterior cervical chain region. Non-tender.  Respiratory: Respiratory effort normal, BS equal bilaterally without rales, rhonchi, wheezing or stridor.  Cardio: RRR without murmurs, rubs or gallops. Brisk peripheral pulses without edema.  Chest: symmetric, with normal excursions and percussion.  Abdomen: Soft, nontender, no guarding, rebound, hernias, masses, or organomegaly.  Lymphatics: Non tender without lymphadenopathy.  Genitourinary: Defer to urology, denies concerns Musculoskeletal: Full ROM all peripheral extremities,5/5 strength, and normal gait.  Skin: Warm, dry without rashes, lesions, ecchymosis. Well healed surgical scars to right lower/lateral chest, upper chest, right submandibular.  Neuro: Cranial nerves intact, reflexes equal bilaterally. Normal muscle tone, no cerebellar symptoms. Sensation intact.  Psych: Awake and  oriented X 3, normal affect, Insight and Judgment appropriate.   Katlyne Nishida 10:13 AM [facility]

## 2023-03-09 LAB — CBC WITH DIFFERENTIAL/PLATELET
Absolute Lymphocytes: 2160 {cells}/uL (ref 850–3900)
Absolute Monocytes: 735 {cells}/uL (ref 200–950)
Basophils Absolute: 38 {cells}/uL (ref 0–200)
Basophils Relative: 0.5 %
Eosinophils Absolute: 98 {cells}/uL (ref 15–500)
Eosinophils Relative: 1.3 %
HCT: 45.7 % (ref 38.5–50.0)
Hemoglobin: 14.3 g/dL (ref 13.2–17.1)
MCH: 25.9 pg — ABNORMAL LOW (ref 27.0–33.0)
MCHC: 31.3 g/dL — ABNORMAL LOW (ref 32.0–36.0)
MCV: 82.8 fL (ref 80.0–100.0)
MPV: 11.7 fL (ref 7.5–12.5)
Monocytes Relative: 9.8 %
Neutro Abs: 4470 {cells}/uL (ref 1500–7800)
Neutrophils Relative %: 59.6 %
Platelets: 302 10*3/uL (ref 140–400)
RBC: 5.52 10*6/uL (ref 4.20–5.80)
RDW: 13.6 % (ref 11.0–15.0)
Total Lymphocyte: 28.8 %
WBC: 7.5 10*3/uL (ref 3.8–10.8)

## 2023-03-09 LAB — HEMOGLOBIN A1C
Hgb A1c MFr Bld: 5.9 %{Hb} — ABNORMAL HIGH (ref <5.7)
Mean Plasma Glucose: 123 mg/dL
eAG (mmol/L): 6.8 mmol/L

## 2023-03-09 LAB — COMPLETE METABOLIC PANEL WITH GFR
AG Ratio: 1.6 (calc) (ref 1.0–2.5)
ALT: 42 U/L (ref 9–46)
AST: 44 U/L — ABNORMAL HIGH (ref 10–35)
Albumin: 4.6 g/dL (ref 3.6–5.1)
Alkaline phosphatase (APISO): 57 U/L (ref 35–144)
BUN: 20 mg/dL (ref 7–25)
CO2: 26 mmol/L (ref 20–32)
Calcium: 9.5 mg/dL (ref 8.6–10.3)
Chloride: 104 mmol/L (ref 98–110)
Creat: 1.27 mg/dL (ref 0.70–1.30)
Globulin: 2.9 g/dL (ref 1.9–3.7)
Glucose, Bld: 89 mg/dL (ref 65–99)
Potassium: 3.9 mmol/L (ref 3.5–5.3)
Sodium: 140 mmol/L (ref 135–146)
Total Bilirubin: 0.6 mg/dL (ref 0.2–1.2)
Total Protein: 7.5 g/dL (ref 6.1–8.1)
eGFR: 67 mL/min/{1.73_m2} (ref >=60)

## 2023-03-09 LAB — LIPID PANEL
Cholesterol: 174 mg/dL (ref <200)
HDL: 52 mg/dL (ref >=40)
LDL Cholesterol (Calc): 104 mg/dL — ABNORMAL HIGH
Non-HDL Cholesterol (Calc): 122 mg/dL (ref <130)
Total CHOL/HDL Ratio: 3.3 (calc) (ref <5.0)
Triglycerides: 90 mg/dL (ref <150)

## 2023-03-10 ENCOUNTER — Telehealth: Payer: Self-pay | Admitting: Nurse Practitioner

## 2023-03-10 NOTE — Telephone Encounter (Signed)
Patient aware.

## 2023-03-10 NOTE — Telephone Encounter (Signed)
Patient states that he had a steroid injection in his back on 03/02/2023. Could that cause his liver levels to be higher?

## 2023-03-31 ENCOUNTER — Other Ambulatory Visit: Payer: Self-pay | Admitting: Specialist

## 2023-03-31 DIAGNOSIS — M5451 Vertebrogenic low back pain: Secondary | ICD-10-CM

## 2023-03-31 DIAGNOSIS — M545 Low back pain, unspecified: Secondary | ICD-10-CM

## 2023-04-08 NOTE — Discharge Instructions (Signed)

## 2023-04-11 ENCOUNTER — Ambulatory Visit
Admission: RE | Admit: 2023-04-11 | Discharge: 2023-04-11 | Disposition: A | Discharge status: Home / Self Care | Payer: Medicare Other | Source: Ambulatory Visit | Attending: Specialist | Admitting: Specialist

## 2023-04-11 ENCOUNTER — Inpatient Hospital Stay
Admission: RE | Admit: 2023-04-11 | Discharge: 2023-04-11 | Disposition: A | Discharge status: Home / Self Care | Payer: Medicare Other | Source: Ambulatory Visit | Attending: Specialist | Admitting: Specialist

## 2023-04-11 DIAGNOSIS — M5451 Vertebrogenic low back pain: Secondary | ICD-10-CM

## 2023-04-11 DIAGNOSIS — M545 Low back pain, unspecified: Secondary | ICD-10-CM

## 2023-04-11 MED ORDER — ONDANSETRON HCL 4 MG/2ML IJ SOLN: 4.0000 mg | Freq: Once | INTRAMUSCULAR | Status: DC | PRN

## 2023-04-11 MED ORDER — IOPAMIDOL (ISOVUE-M 200) INJECTION 41%
20.0000 mL | Freq: Once | INTRAMUSCULAR | Status: AC
  Administered 2023-04-11: 20 mL via INTRATHECAL

## 2023-04-11 MED ORDER — MEPERIDINE HCL 50 MG/ML IJ SOLN: 50.0000 mg | Freq: Once | INTRAMUSCULAR | Status: DC | PRN

## 2023-04-11 MED ORDER — DIAZEPAM 5 MG PO TABS
10.0000 mg | ORAL_TABLET | Freq: Once | ORAL | Status: AC
  Administered 2023-04-11: 5 mg via ORAL

## 2023-05-23 ENCOUNTER — Encounter: Payer: Medicare Other | Admitting: Nurse Practitioner

## 2023-05-23 ENCOUNTER — Encounter: Payer: Self-pay | Admitting: Nurse Practitioner

## 2023-05-23 ENCOUNTER — Ambulatory Visit (INDEPENDENT_AMBULATORY_CARE_PROVIDER_SITE_OTHER): Payer: Medicare Other | Admitting: Nurse Practitioner

## 2023-05-23 VITALS — BP 122/88 | HR 76 | Temp 97.5°F | Ht 72.0 in | Wt 275.0 lb

## 2023-05-23 DIAGNOSIS — R519 Headache, unspecified: Secondary | ICD-10-CM

## 2023-05-23 DIAGNOSIS — N400 Enlarged prostate without lower urinary tract symptoms: Secondary | ICD-10-CM

## 2023-05-23 DIAGNOSIS — Z136 Encounter for screening for cardiovascular disorders: Secondary | ICD-10-CM | POA: Diagnosis not present

## 2023-05-23 DIAGNOSIS — I10 Essential (primary) hypertension: Secondary | ICD-10-CM | POA: Diagnosis not present

## 2023-05-23 DIAGNOSIS — E782 Mixed hyperlipidemia: Secondary | ICD-10-CM

## 2023-05-23 DIAGNOSIS — Z0001 Encounter for general adult medical examination with abnormal findings: Secondary | ICD-10-CM

## 2023-05-23 DIAGNOSIS — Z125 Encounter for screening for malignant neoplasm of prostate: Secondary | ICD-10-CM

## 2023-05-23 DIAGNOSIS — Z79899 Other long term (current) drug therapy: Secondary | ICD-10-CM

## 2023-05-23 DIAGNOSIS — E559 Vitamin D deficiency, unspecified: Secondary | ICD-10-CM

## 2023-05-23 DIAGNOSIS — Z1329 Encounter for screening for other suspected endocrine disorder: Secondary | ICD-10-CM

## 2023-05-23 DIAGNOSIS — R7303 Prediabetes: Secondary | ICD-10-CM

## 2023-05-23 DIAGNOSIS — Z85528 Personal history of other malignant neoplasm of kidney: Secondary | ICD-10-CM

## 2023-05-23 DIAGNOSIS — G4733 Obstructive sleep apnea (adult) (pediatric): Secondary | ICD-10-CM

## 2023-05-23 DIAGNOSIS — N529 Male erectile dysfunction, unspecified: Secondary | ICD-10-CM

## 2023-05-23 DIAGNOSIS — K2100 Gastro-esophageal reflux disease with esophagitis, without bleeding: Secondary | ICD-10-CM

## 2023-05-23 DIAGNOSIS — Z0000 Encounter for general adult medical examination without abnormal findings: Secondary | ICD-10-CM

## 2023-05-23 DIAGNOSIS — Z1389 Encounter for screening for other disorder: Secondary | ICD-10-CM

## 2023-05-23 DIAGNOSIS — H8109 Meniere's disease, unspecified ear: Secondary | ICD-10-CM

## 2023-05-23 DIAGNOSIS — Z131 Encounter for screening for diabetes mellitus: Secondary | ICD-10-CM

## 2023-05-23 NOTE — Patient Instructions (Signed)

## 2023-05-23 NOTE — Progress Notes (Signed)
Complete Physical  Assessment and Plan:  Diagnoses and all orders for this visit:  Routine general medical examination at a health care facility Due annually  Health Maintenance reviewed Healthy lifestyle reviewed and goals set  Essential hypertension Continue medication Monitor blood pressure at home; call if consistently over 130/80 Continue DASH diet.   Reminder to go to the ER if any CP, SOB, nausea, dizziness, severe HA, changes vision/speech, left arm numbness and tingling and jaw pain.\  OSA (obstructive sleep apnea) Recent hypoglossal nerve stimulator implantation, - followed by Dr. Jenne Pane, improved per patient   GERD No suspected reflux complications (Barret/stricture). Lifestyle modification:  wt loss, avoid meals 2-3h before bedtime. Consider eliminating food triggers:  chocolate, caffeine, EtOH, acid/spicy food.   Meniere's disease, unspecified laterality Suspected diagnosis by [facility]audiology after episodes of decreased hearing, vertigo and syncope. Reportedly currently treated by lifestyle modification; surgery was discussed but declined at this time. No further episodes of syncope.   Hyperlipidemia Discussed lifestyle modifications. Recommended diet heavy in fruits and veggies, omega 3's. Decrease consumption of animal meats, cheeses, and dairy products. Remain active and exercise as tolerated. Continue to monitor. Check lipids/TSH  Prediabetes Education: Reviewed 'ABCs' of diabetes management  Discussed goals to be met and/or maintained include A1C (<7) Blood pressure (<130/80) Cholesterol (LDL <70) Continue Eye Exam yearly  Continue Dental Exam Q6 mo Discussed dietary recommendations Discussed Physical Activity recommendations Check A1C  Vitamin D deficiency Continue supplementation  Check and monitor levels  Medication management All medications discussed and reviewed in full. All questions and concerns regarding medications addressed.      Morbid Obesity - BMI 30+ with OSA Discussed appropriate BMI Diet modification. Physical activity. Encouraged/praised to build confidence.   History of L kidney cancer/ renal cysts S/p partial nephrectomy, now monitored annually by Dr. Liliane Shi  BPH without symptoms Per urology notes; denies symptoms; monitor Monitor PSA  Erectile dysfunction Doing well with daily cialis;  follow up urology  Headache Managed Stay well hydrated  Screening for cardiovascular condition -EKG  Screening for hematuria and proteinuria -UA/Microalbumin  Screening for thyroid disorder -TSH  Screening for DM -A1c  Screening for prostate cancer -PSA  Orders Placed This Encounter  Procedures   CBC with Differential/Platelet   COMPLETE METABOLIC PANEL WITH GFR   Magnesium   Lipid panel   TSH   Hemoglobin A1c   Insulin, random   VITAMIN D 25 Hydroxy (Vit-D Deficiency, Fractures)   Urinalysis, Routine w reflex microscopic   Microalbumin / creatinine urine ratio   PSA   EKG 12-Lead   Notify office for further evaluation and treatment, questions or concerns if any reported s/s fail to improve.   The patient was advised to call back or seek an in-person evaluation if any symptoms worsen or if the condition fails to improve as anticipated.   Further disposition pending results of labs. Discussed med's effects and SE's.    I discussed the assessment and treatment plan with the patient. The patient was provided an opportunity to ask questions and all were answered. The patient agreed with the plan and demonstrated an understanding of the instructions.  Discussed med's effects and SE's. Screening labs and tests as requested with regular follow-up as recommended.  I provided 40 minutes of face-to-face time during this encounter including counseling, chart review, and critical decision making was preformed.   Future Appointments  Date Time Provider Department Center  09/08/2023  9:30 AM  Lucky Cowboy, MD [facility] None  05/23/2024  3:00  PM Adela Glimpse, NP [facility] None     HPI 57 y.o. AA male - . Patient presents for a complete physical. has Essential hypertension; Hyperlipidemia; Prediabetes; Vitamin D deficiency; OSA (obstructive sleep apnea); Medication management; GERD; TMJ (temporomandibular joint syndrome); Morbid obesity (HCC) - BMI 30+ with OSA; Meniere disease; Testosterone deficiency; BPH (benign prostatic hyperplasia); History of kidney cancer; Hepatic steatosis; and HSV-2 seropositive on their problem list.   Overall he reports feeling well today.  He has no new or additional concerns.  Follows with our clinic every 3 months.   Retired early at 71 after working for [employer], divorced with two grown children. Has a steady girlfriend and recently became engaged after visiting [place].  He is being followed by the [facility] for frequent headaches; he takes nortriptyline 10 mg daily, that he reports is effective.   He is diagnosed with Meniere's disease, has chronic hearing impairment and is followed as needed by [facility]- currently managed by hydrochlorothiazide, allergy medication, lifestyle.   He has hx of OSA previously with CPAP; struggled due to claustrophobia, felt worse with machine, had a repeat sleep study scheduled through the [place]. On 05/02/2019 underwent hypoglossal nerve stimulator placement by ENT Dr. Christia Reading, has done well since then, has follow up sleep study planned next year.   He has GERD well controlled by medication.  BMI is Body mass index is 37.3 kg/m., he has been working on diet and exercise- he reports he walks 5 miles a day as weather allows which also helps his back stiffness significantly.  Works out in gym 3 days a week. Wt Readings from Last 3 Encounters:  05/23/23 275 lb (124.7 kg)  03/08/23 277 lb 6.4 oz (125.8 kg)  11/23/22 272 lb (123.4 kg)   He reports well controlled BPs at home, 120/70s, today their BP is  BP: 122/88 He does workout. He denies chest pain, shortness of breath, dizziness.   He is not on cholesterol medication and denies myalgias. No family hx of MI or CVA. His cholesterol is not at goal. The cholesterol last visit was:   Lab Results  Component Value Date   CHOL 174 03/08/2023   HDL 52 03/08/2023   LDLCALC 104 (H) 03/08/2023   TRIG 90 03/08/2023   CHOLHDL 3.3 03/08/2023   He has been working on diet and exercise for prediabetes, he is not on bASA, he is on ACE/ARB and denies increased appetite, nausea, paresthesia of the feet, polydipsia, polyuria, visual disturbances and vomiting. Last A1C in the office was:  Lab Results  Component Value Date   HGBA1C 5.9 (H) 03/08/2023   Last GFR:   Lab Results  Component Value Date   GFRAA 89 11/13/2020   Patient is on Vitamin D supplement, taking ? 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 Liliane Shi ,follows annually, reports had PSA check then. On cialis 5 mg daily for ED and BPH.  Last PSA was: Lab Results  Component Value Date   PSA 1.13 05/19/2022   He has had mild persistent LFT elevation; improved with reducing sodas; [place] 06/20/2019 shows fatty liver;  Lab Results  Component Value Date   ALT 42 03/08/2023   AST 44 (H) 03/08/2023   ALKPHOS 52 03/23/2016   BILITOT 0.6 03/08/2023  Current Medications:  Current Outpatient Medications on File Prior to Visit  Medication Sig Dispense Refill   Ascorbic Acid (VITAMIN C) 1000 MG tablet Take 1,000 mg by mouth daily.     carboxymethylcellulose (REFRESH TEARS) 0.5 % SOLN Place 1 drop into both eyes 3 (three) times daily as needed (dry eyes).     cetirizine (ZYRTEC) 10 MG tablet Take 1 tablet (10 mg total) by mouth daily. (Patient taking differently: Take 10 mg by mouth See admin instructions. Take 10 mg daily for 1 week then take  loratadine 10 mg daily for 1 week, alternating weeks) 90 tablet 4   Cholecalciferol (VITAMIN D3) 50 MCG (2000 UT) capsule Take 4,000 Units by mouth daily.     diclofenac Sodium (VOLTAREN) 1 % GEL Apply 4 g topically 4 (four) times daily. 4 g 2   Ginger, Zingiber officinalis, (GINGER PO) Take by mouth.     ibuprofen (IBU) 800 MG tablet Take 1 tablet (800 mg total) by mouth every 8 (eight) hours as needed. 30 tablet 0   lansoprazole (PREVACID) 15 MG capsule Take 15 mg by mouth daily.     lisinopril-hydrochlorothiazide (ZESTORETIC) 20-12.5 MG tablet Take 1 tablet by mouth daily.     magnesium oxide (MAG-OX) 400 MG tablet Take 400 mg by mouth daily.     nortriptyline (PAMELOR) 10 MG capsule Take 10 mg by mouth daily as needed (migraines).     olmesartan (BENICAR) 40 MG tablet TAKE 1/2 TO 1 TABLET BY MOUTH AT NIGHT FOR BLOOD PRESSURE 30 tablet 3   pseudoephedrine (SUDAFED) 120 MG 12 hr tablet Take  1 tablet  2 x /day (every 12 hours)  for Ear Fluid, Sinus & Chest Congestion 180 tablet 1   rosuvastatin (CRESTOR) 20 MG tablet Take 1 tablet Daily for Cholesterol 90 tablet 3   tadalafil (CIALIS) 5 MG tablet Take 1 tablet daily for ED and prostate. 90 tablet 1   tiZANidine (ZANAFLEX) 4 MG tablet Take 1 tab (4 mg) QD PRN for treatment of muscle spasms associated with back pain. 30 tablet 0   vitamin E 1000 UNIT capsule Take 1,000 Units by mouth daily.     Zinc 50 MG TABS Take 50 mg by mouth daily.      No current facility-administered medications on file prior to visit.   Allergies:  Allergies  Allergen Reactions   Ziac [Bisoprolol-Hydrochlorothiazide]     Erectile dysfunction   Topiramate     Other reaction(s): Suicidal thoughts   Health Maintenance:  Immunization History  Administered Date(s) Administered   Influenza,inj,Quad PF,6+ Mos 02/20/2022   Influenza-Unspecified 03/24/2018, 03/26/2019, 02/22/2020, 04/08/2021   PFIZER Comirnaty(Gray Top)Covid-19 Tri-Sucrose Vaccine 12/15/2020    PFIZER(Purple Top)SARS-COV-2 Vaccination 07/19/2019, 08/09/2019   PPD Test 12/18/2013, 02/11/2015   Pfizer Covid-19 Vaccine Bivalent Booster 55yrs & up 03/05/2020, 12/15/2020, 04/08/2021   Pfizer(Comirnaty)Fall Seasonal Vaccine 12 years and older 02/09/2023   Td 05/24/2010   Tdap 07/20/2014   Zoster Recombinant(Shingrix) 04/12/2019, 03/10/2020   Tetanus: 2012, reports has had since at the [place] Pneumovax: n/a Prevnar 20: n/a Flu vaccine: 02/2023 Shingrix: 2/2, 2021 at the [place] Covid 19: 2/2, 2021, pfizer last booster 03/2023  Colonoscopy: 03/2022 EGD: n/a  Eye Exam: Dr. Delaney Meigs, hx of cataracts, now going to my eye care at [facility], last 2024 Dentist: Dr. Lorenza Burton, last 2023, q62m  Patient Care Team: Lucky Cowboy, MD as PCP - General (Internal Medicine)  Medical History:  has Essential hypertension; Hyperlipidemia; Prediabetes; Vitamin D deficiency; OSA (obstructive sleep  apnea); Medication management; GERD; TMJ (temporomandibular joint syndrome); Morbid obesity (HCC) - BMI 30+ with OSA; Meniere disease; Testosterone deficiency; BPH (benign prostatic hyperplasia); History of kidney cancer; Hepatic steatosis; and HSV-2 seropositive on their problem list. Surgical History:  He  has a past surgical history that includes Total hip arthroplasty (Right, 2007); Cataract extraction w/ intraocular lens implant (Left, 2014); Bunionectomy (Bilateral); Lumbar laminectomy/decompression microdiscectomy (Left, 07/31/2014); Cataract extraction (Right); Partial nephrectomy (Left, 2008); Drug induced endoscopy (N/A, 03/02/2019); Eye surgery; and Implantation of hypoglossal nerve stimulator (Right, 05/02/2019). Family History:  His family history includes Breast cancer in his mother and paternal grandmother; Hypertension in his maternal grandmother. Social History:   reports that he has never smoked. He has never used smokeless tobacco. He reports that he does not drink alcohol and does not use drugs.      Review of Systems:  Review of Systems  Constitutional:  Negative for malaise/fatigue and weight loss.  HENT:  Negative for ear discharge, ear pain, hearing loss (stable, chronic after military) and tinnitus.   Eyes:  Negative for blurred vision and double vision.  Respiratory:  Negative for cough, shortness of breath and wheezing.   Cardiovascular:  Negative for chest pain, palpitations, orthopnea, claudication and leg swelling.  Gastrointestinal:  Negative for abdominal pain, blood in stool, constipation, diarrhea, heartburn, melena, nausea and vomiting.  Genitourinary: Negative.   Musculoskeletal:  Negative for back pain, falls, joint pain and myalgias.  Skin:  Negative for rash.  Neurological:  Positive for dizziness (Intermittent; vertigo related to Meniere's). Negative for tingling, sensory change, speech change, weakness and headaches.  Endo/Heme/Allergies:  Negative for polydipsia.  Psychiatric/Behavioral: Negative.    All other systems reviewed and are negative.   Physical Exam: Estimated body mass index is 37.3 kg/m as calculated from the following:   Height as of this encounter: 6' (1.829 m).   Weight as of this encounter: 275 lb (124.7 kg). BP 122/88   Pulse 76   Temp (!) 97.5 F (36.4 C)   Ht 6' (1.829 m)   Wt 275 lb (124.7 kg)   SpO2 98%   BMI 37.30 kg/m  General Appearance: Well nourished, in no apparent distress.  Eyes: PERRLA, EOMs, conjunctiva no swelling or erythema Sinuses: No Frontal/maxillary tenderness  ENT/Mouth: Ext aud canals clear, normal light reflex with TMs without erythema, bulging. Good dentition. No erythema, swelling, or exudate on post pharynx. Tonsils not swollen or erythematous. HOH.  Neck: Supple, thyroid normal. No bruits. Hypoglossal stimulator palpable in right neck over anterior cervical chain region. Non-tender.  Respiratory: Respiratory effort normal, BS equal bilaterally without rales, rhonchi, wheezing or stridor.  Cardio: RRR  without murmurs, rubs or gallops. Brisk peripheral pulses without edema.  Chest: symmetric, with normal excursions and percussion.  Abdomen: Soft, nontender, no guarding, rebound, hernias, masses, or organomegaly.  Lymphatics: Non tender without lymphadenopathy.  Genitourinary: Defer to urology, denies concerns Musculoskeletal: Full ROM all peripheral extremities,5/5 strength, and normal gait.  Skin: Warm, dry without rashes, lesions, ecchymosis. Well healed surgical scars to right lower/lateral chest, upper chest, right submandibular.  Neuro: Cranial nerves intact, reflexes equal bilaterally. Normal muscle tone, no cerebellar symptoms. Sensation intact.  Psych: Awake and oriented X 3, normal affect, Insight and Judgment appropriate.   EKG: NSR  Julieanne Hadsall 9:50 PM [facility]

## 2023-05-24 LAB — MAGNESIUM: Magnesium: 2.1 mg/dL (ref 1.5–2.5)

## 2023-05-24 LAB — CBC WITH DIFFERENTIAL/PLATELET
Absolute Lymphocytes: 2479 {cells}/uL (ref 850–3900)
Absolute Monocytes: 644 {cells}/uL (ref 200–950)
Basophils Absolute: 37 {cells}/uL (ref 0–200)
Basophils Relative: 0.5 %
Eosinophils Absolute: 96 {cells}/uL (ref 15–500)
Eosinophils Relative: 1.3 %
HCT: 44.8 % (ref 38.5–50.0)
Hemoglobin: 14.2 g/dL (ref 13.2–17.1)
MCH: 25.3 pg — ABNORMAL LOW (ref 27.0–33.0)
MCHC: 31.7 g/dL — ABNORMAL LOW (ref 32.0–36.0)
MCV: 79.9 fL — ABNORMAL LOW (ref 80.0–100.0)
MPV: 12.1 fL (ref 7.5–12.5)
Monocytes Relative: 8.7 %
Neutro Abs: 4144 {cells}/uL (ref 1500–7800)
Neutrophils Relative %: 56 %
Platelets: 258 Thousand/uL (ref 140–400)
RBC: 5.61 Million/uL (ref 4.20–5.80)
RDW: 16.6 % — ABNORMAL HIGH (ref 11.0–15.0)
Total Lymphocyte: 33.5 %
WBC: 7.4 Thousand/uL (ref 3.8–10.8)

## 2023-05-24 LAB — COMPLETE METABOLIC PANEL WITHOUT GFR
AG Ratio: 1.5 (calc) (ref 1.0–2.5)
ALT: 33 U/L (ref 9–46)
AST: 37 U/L — ABNORMAL HIGH (ref 10–35)
Albumin: 4.6 g/dL (ref 3.6–5.1)
Alkaline phosphatase (APISO): 50 U/L (ref 35–144)
BUN: 11 mg/dL (ref 7–25)
CO2: 28 mmol/L (ref 20–32)
Calcium: 9.6 mg/dL (ref 8.6–10.3)
Chloride: 102 mmol/L (ref 98–110)
Creat: 1.2 mg/dL (ref 0.70–1.30)
Globulin: 3.1 g/dL (ref 1.9–3.7)
Glucose, Bld: 87 mg/dL (ref 65–99)
Potassium: 3.6 mmol/L (ref 3.5–5.3)
Sodium: 142 mmol/L (ref 135–146)
Total Bilirubin: 0.7 mg/dL (ref 0.2–1.2)
Total Protein: 7.7 g/dL (ref 6.1–8.1)
eGFR: 71 mL/min/1.73m2 (ref >=60)

## 2023-05-24 LAB — VITAMIN D 25 HYDROXY (VIT D DEFICIENCY, FRACTURES): Vit D, 25-Hydroxy: 89 ng/mL (ref 30–100)

## 2023-05-24 LAB — URINALYSIS, ROUTINE W REFLEX MICROSCOPIC
Bilirubin Urine: NEGATIVE
Glucose, UA: NEGATIVE
Hgb urine dipstick: NEGATIVE
Ketones, ur: NEGATIVE
Leukocytes,Ua: NEGATIVE
Nitrite: NEGATIVE
Protein, ur: NEGATIVE
Specific Gravity, Urine: 1.013 (ref 1.001–1.035)
pH: 7 (ref 5.0–8.0)

## 2023-05-24 LAB — LIPID PANEL
Cholesterol: 215 mg/dL — ABNORMAL HIGH (ref <200)
HDL: 51 mg/dL (ref >=40)
LDL Cholesterol (Calc): 142 mg/dL — ABNORMAL HIGH
Non-HDL Cholesterol (Calc): 164 mg/dL — ABNORMAL HIGH (ref <130)
Total CHOL/HDL Ratio: 4.2 (calc) (ref <5.0)
Triglycerides: 105 mg/dL (ref <150)

## 2023-05-24 LAB — HEMOGLOBIN A1C
Hgb A1c MFr Bld: 6.4 %{Hb} — ABNORMAL HIGH (ref <5.7)
Mean Plasma Glucose: 137 mg/dL
eAG (mmol/L): 7.6 mmol/L

## 2023-05-24 LAB — MICROALBUMIN / CREATININE URINE RATIO
Creatinine, Urine: 102 mg/dL (ref 20–320)
Microalb Creat Ratio: 4 mg/g{creat} (ref <30)
Microalb, Ur: 0.4 mg/dL

## 2023-05-24 LAB — PSA: PSA: 2.64 ng/mL (ref <=4.00)

## 2023-05-24 LAB — TSH: TSH: 1.91 m[IU]/L (ref 0.40–4.50)

## 2023-05-24 LAB — INSULIN, RANDOM: Insulin: 15.8 u[IU]/mL

## 2023-06-09 ENCOUNTER — Encounter: Payer: Medicare Other | Admitting: Nurse Practitioner

## 2023-07-05 ENCOUNTER — Encounter: Payer: Self-pay | Admitting: *Deleted

## 2023-07-28 ENCOUNTER — Encounter: Payer: Self-pay | Admitting: *Deleted

## 2023-09-08 ENCOUNTER — Ambulatory Visit: Payer: Medicare Other | Admitting: Internal Medicine

## 2024-05-23 ENCOUNTER — Encounter: Payer: Medicare Other | Admitting: Nurse Practitioner
